# Patient Record
Sex: Female | Born: 1965 | Race: White | Hispanic: No | Marital: Married | Smoking: Never smoker
Health system: Southern US, Community
[De-identification: ages and names within clinical notes are randomized; demographics above are authoritative.]

## PROBLEM LIST (undated history)

## (undated) DIAGNOSIS — C801 Malignant (primary) neoplasm, unspecified: Secondary | ICD-10-CM

## (undated) DIAGNOSIS — I4891 Unspecified atrial fibrillation: Secondary | ICD-10-CM

## (undated) DIAGNOSIS — I471 Supraventricular tachycardia: Secondary | ICD-10-CM

## (undated) DIAGNOSIS — K219 Gastro-esophageal reflux disease without esophagitis: Secondary | ICD-10-CM

## (undated) DIAGNOSIS — K449 Diaphragmatic hernia without obstruction or gangrene: Secondary | ICD-10-CM

## (undated) DIAGNOSIS — I491 Atrial premature depolarization: Secondary | ICD-10-CM

## (undated) DIAGNOSIS — R0989 Other specified symptoms and signs involving the circulatory and respiratory systems: Secondary | ICD-10-CM

## (undated) DIAGNOSIS — R001 Bradycardia, unspecified: Secondary | ICD-10-CM

## (undated) DIAGNOSIS — R5383 Other fatigue: Secondary | ICD-10-CM

## (undated) DIAGNOSIS — I4719 Other supraventricular tachycardia: Secondary | ICD-10-CM

## (undated) DIAGNOSIS — R011 Cardiac murmur, unspecified: Secondary | ICD-10-CM

## (undated) DIAGNOSIS — I8390 Asymptomatic varicose veins of unspecified lower extremity: Secondary | ICD-10-CM

## (undated) DIAGNOSIS — I493 Ventricular premature depolarization: Secondary | ICD-10-CM

## (undated) DIAGNOSIS — I351 Nonrheumatic aortic (valve) insufficiency: Secondary | ICD-10-CM

## (undated) DIAGNOSIS — K317 Polyp of stomach and duodenum: Secondary | ICD-10-CM

## (undated) DIAGNOSIS — Z9889 Other specified postprocedural states: Secondary | ICD-10-CM

## (undated) HISTORY — DX: Unspecified atrial fibrillation: I48.91

## (undated) HISTORY — PX: CHOLECYSTECTOMY: SHX55

## (undated) HISTORY — DX: Nonrheumatic aortic (valve) insufficiency: I35.1

## (undated) HISTORY — DX: Other specified symptoms and signs involving the circulatory and respiratory systems: R09.89

## (undated) HISTORY — DX: Asymptomatic varicose veins of unspecified lower extremity: I83.90

## (undated) HISTORY — DX: Atrial premature depolarization: I49.1

## (undated) HISTORY — DX: Supraventricular tachycardia: I47.1

## (undated) HISTORY — PX: ABDOMINAL HYSTERECTOMY: SHX81

## (undated) HISTORY — DX: Diaphragmatic hernia without obstruction or gangrene: K44.9

## (undated) HISTORY — DX: Other supraventricular tachycardia: I47.19

## (undated) HISTORY — DX: Malignant (primary) neoplasm, unspecified: C80.1

## (undated) HISTORY — DX: Bradycardia, unspecified: R00.1

## (undated) HISTORY — DX: Ventricular premature depolarization: I49.3

## (undated) HISTORY — DX: Polyp of stomach and duodenum: K31.7

## (undated) HISTORY — DX: Cardiac murmur, unspecified: R01.1

## (undated) HISTORY — DX: Gastro-esophageal reflux disease without esophagitis: K21.9

## (undated) HISTORY — DX: Other specified postprocedural states: Z98.890

## (undated) HISTORY — DX: Other fatigue: R53.83

## (undated) HISTORY — PX: VESICOVAGINAL FISTULA CLOSURE W/ TAH: SUR271

## (undated) HISTORY — PX: ROTATOR CUFF REPAIR: SHX139

---

## 1997-08-07 ENCOUNTER — Other Ambulatory Visit: Admission: RE | Admit: 1997-08-07 | Discharge: 1997-08-07 | Payer: Self-pay | Admitting: Obstetrics and Gynecology

## 1997-08-17 ENCOUNTER — Other Ambulatory Visit: Admission: RE | Admit: 1997-08-17 | Discharge: 1997-08-17 | Payer: Self-pay | Admitting: Obstetrics and Gynecology

## 1998-01-22 ENCOUNTER — Encounter: Admission: RE | Admit: 1998-01-22 | Discharge: 1998-04-22 | Payer: Self-pay | Admitting: Obstetrics and Gynecology

## 1998-03-04 ENCOUNTER — Ambulatory Visit (HOSPITAL_COMMUNITY): Admission: RE | Admit: 1998-03-04 | Discharge: 1998-03-04 | Payer: Self-pay | Admitting: Obstetrics and Gynecology

## 1998-03-11 ENCOUNTER — Inpatient Hospital Stay (HOSPITAL_COMMUNITY): Admission: AD | Admit: 1998-03-11 | Discharge: 1998-03-14 | Payer: Self-pay | Admitting: Obstetrics and Gynecology

## 1998-05-06 ENCOUNTER — Other Ambulatory Visit: Admission: RE | Admit: 1998-05-06 | Discharge: 1998-05-06 | Payer: Self-pay | Admitting: Obstetrics and Gynecology

## 1999-07-23 ENCOUNTER — Other Ambulatory Visit: Admission: RE | Admit: 1999-07-23 | Discharge: 1999-07-23 | Payer: Self-pay | Admitting: Obstetrics and Gynecology

## 2000-08-10 ENCOUNTER — Other Ambulatory Visit: Admission: RE | Admit: 2000-08-10 | Discharge: 2000-08-10 | Payer: Self-pay | Admitting: Obstetrics and Gynecology

## 2000-08-17 ENCOUNTER — Encounter: Payer: Self-pay | Admitting: Obstetrics and Gynecology

## 2000-08-17 ENCOUNTER — Ambulatory Visit (HOSPITAL_COMMUNITY): Admission: RE | Admit: 2000-08-17 | Discharge: 2000-08-17 | Payer: Self-pay | Admitting: Obstetrics and Gynecology

## 2001-08-17 ENCOUNTER — Other Ambulatory Visit: Admission: RE | Admit: 2001-08-17 | Discharge: 2001-08-17 | Payer: Self-pay | Admitting: Obstetrics and Gynecology

## 2001-10-18 ENCOUNTER — Encounter (INDEPENDENT_AMBULATORY_CARE_PROVIDER_SITE_OTHER): Payer: Self-pay | Admitting: Specialist

## 2001-10-18 ENCOUNTER — Inpatient Hospital Stay (HOSPITAL_COMMUNITY): Admission: RE | Admit: 2001-10-18 | Discharge: 2001-10-21 | Payer: Self-pay | Admitting: Obstetrics and Gynecology

## 2002-08-16 ENCOUNTER — Encounter: Admission: RE | Admit: 2002-08-16 | Discharge: 2002-08-16 | Payer: Self-pay | Admitting: Obstetrics and Gynecology

## 2002-08-16 ENCOUNTER — Encounter: Payer: Self-pay | Admitting: Obstetrics and Gynecology

## 2002-10-04 ENCOUNTER — Other Ambulatory Visit: Admission: RE | Admit: 2002-10-04 | Discharge: 2002-10-04 | Payer: Self-pay | Admitting: Obstetrics and Gynecology

## 2003-12-27 ENCOUNTER — Encounter: Admission: RE | Admit: 2003-12-27 | Discharge: 2003-12-27 | Payer: Self-pay | Admitting: Obstetrics and Gynecology

## 2005-01-13 ENCOUNTER — Encounter: Admission: RE | Admit: 2005-01-13 | Discharge: 2005-01-13 | Payer: Self-pay | Admitting: Obstetrics and Gynecology

## 2005-03-21 ENCOUNTER — Emergency Department: Payer: Self-pay | Admitting: Internal Medicine

## 2005-11-13 ENCOUNTER — Ambulatory Visit: Payer: Self-pay | Admitting: Internal Medicine

## 2005-11-24 ENCOUNTER — Encounter: Admission: RE | Admit: 2005-11-24 | Discharge: 2005-11-24 | Payer: Self-pay | Admitting: Psychology

## 2005-11-27 ENCOUNTER — Encounter: Admission: RE | Admit: 2005-11-27 | Discharge: 2005-11-27 | Payer: Self-pay | Admitting: General Surgery

## 2006-01-14 ENCOUNTER — Ambulatory Visit (HOSPITAL_COMMUNITY): Admission: RE | Admit: 2006-01-14 | Discharge: 2006-01-14 | Payer: Self-pay | Admitting: General Surgery

## 2006-01-14 ENCOUNTER — Encounter (INDEPENDENT_AMBULATORY_CARE_PROVIDER_SITE_OTHER): Payer: Self-pay | Admitting: Specialist

## 2007-01-07 ENCOUNTER — Encounter: Admission: RE | Admit: 2007-01-07 | Discharge: 2007-01-07 | Payer: Self-pay | Admitting: Obstetrics and Gynecology

## 2007-04-28 DIAGNOSIS — Z9889 Other specified postprocedural states: Secondary | ICD-10-CM

## 2007-04-28 HISTORY — PX: CARDIAC CATHETERIZATION: SHX172

## 2007-04-28 HISTORY — DX: Other specified postprocedural states: Z98.890

## 2007-06-13 ENCOUNTER — Ambulatory Visit: Payer: Self-pay | Admitting: Family Medicine

## 2007-10-12 ENCOUNTER — Ambulatory Visit: Payer: Self-pay | Admitting: Gastroenterology

## 2007-11-28 ENCOUNTER — Other Ambulatory Visit: Payer: Self-pay

## 2007-11-28 ENCOUNTER — Ambulatory Visit: Payer: Self-pay | Admitting: Internal Medicine

## 2007-11-29 ENCOUNTER — Ambulatory Visit: Payer: Self-pay | Admitting: Internal Medicine

## 2007-12-07 ENCOUNTER — Encounter: Payer: Self-pay | Admitting: Internal Medicine

## 2007-12-07 ENCOUNTER — Ambulatory Visit: Payer: Self-pay | Admitting: Cardiology

## 2007-12-07 LAB — CONVERTED CEMR LAB: Pro B Natriuretic peptide (BNP): 125 pg/mL — ABNORMAL HIGH (ref 0.0–100.0)

## 2007-12-21 ENCOUNTER — Ambulatory Visit: Payer: Self-pay | Admitting: Cardiovascular Disease

## 2007-12-21 ENCOUNTER — Encounter: Payer: Self-pay | Admitting: Cardiology

## 2008-02-27 ENCOUNTER — Encounter: Admission: RE | Admit: 2008-02-27 | Discharge: 2008-02-27 | Payer: Self-pay | Admitting: Obstetrics and Gynecology

## 2008-10-02 DIAGNOSIS — K219 Gastro-esophageal reflux disease without esophagitis: Secondary | ICD-10-CM

## 2008-10-02 DIAGNOSIS — R002 Palpitations: Secondary | ICD-10-CM

## 2008-11-28 ENCOUNTER — Ambulatory Visit: Payer: Self-pay | Admitting: Family Medicine

## 2008-12-23 ENCOUNTER — Inpatient Hospital Stay (HOSPITAL_COMMUNITY): Admission: AD | Admit: 2008-12-23 | Discharge: 2008-12-23 | Payer: Self-pay | Admitting: Obstetrics and Gynecology

## 2009-03-02 ENCOUNTER — Ambulatory Visit: Payer: Self-pay | Admitting: Internal Medicine

## 2009-03-12 ENCOUNTER — Encounter: Admission: RE | Admit: 2009-03-12 | Discharge: 2009-03-12 | Payer: Self-pay | Admitting: Obstetrics and Gynecology

## 2009-04-10 ENCOUNTER — Ambulatory Visit: Payer: Self-pay | Admitting: Internal Medicine

## 2009-05-21 ENCOUNTER — Ambulatory Visit: Payer: Self-pay | Admitting: Internal Medicine

## 2009-05-28 DIAGNOSIS — K219 Gastro-esophageal reflux disease without esophagitis: Secondary | ICD-10-CM

## 2009-05-28 HISTORY — DX: Gastro-esophageal reflux disease without esophagitis: K21.9

## 2009-06-19 ENCOUNTER — Ambulatory Visit: Payer: Self-pay | Admitting: Gastroenterology

## 2009-10-31 LAB — HM PAP SMEAR: HM PAP: NORMAL

## 2010-03-13 ENCOUNTER — Encounter: Admission: RE | Admit: 2010-03-13 | Discharge: 2010-03-13 | Payer: Self-pay | Admitting: Obstetrics and Gynecology

## 2010-05-19 ENCOUNTER — Ambulatory Visit
Admission: RE | Admit: 2010-05-19 | Discharge: 2010-05-19 | Payer: Self-pay | Source: Home / Self Care | Attending: Cardiology | Admitting: Cardiology

## 2010-05-19 ENCOUNTER — Encounter: Payer: Self-pay | Admitting: Cardiology

## 2010-05-19 DIAGNOSIS — R5383 Other fatigue: Secondary | ICD-10-CM

## 2010-05-19 DIAGNOSIS — R5381 Other malaise: Secondary | ICD-10-CM | POA: Insufficient documentation

## 2010-05-19 DIAGNOSIS — R609 Edema, unspecified: Secondary | ICD-10-CM | POA: Insufficient documentation

## 2010-05-26 ENCOUNTER — Ambulatory Visit: Payer: Self-pay | Admitting: Internal Medicine

## 2010-05-27 ENCOUNTER — Ambulatory Visit: Admit: 2010-05-27 | Payer: Self-pay

## 2010-05-29 LAB — CONVERTED CEMR LAB
Basophils Relative: 0 % (ref 0–1)
Eosinophils Absolute: 0.1 10*3/uL (ref 0.0–0.7)
HCT: 40.1 % (ref 36.0–46.0)
Lymphocytes Relative: 31 % (ref 12–46)
Lymphs Abs: 2.3 10*3/uL (ref 0.7–4.0)
MCV: 89.7 fL (ref 78.0–100.0)
Monocytes Relative: 6 % (ref 3–12)
Neutro Abs: 4.4 10*3/uL (ref 1.7–7.7)
Platelets: 248 10*3/uL (ref 150–400)
Pro B Natriuretic peptide (BNP): 49.8 pg/mL (ref 0.0–100.0)
TSH: 3.682 microintl units/mL (ref 0.350–4.500)
WBC: 7.2 10*3/uL (ref 4.0–10.5)

## 2010-05-29 NOTE — Assessment & Plan Note (Signed)
Summary: ROV/AMD   Visit Type:  Follow-up Primary Provider:  Dr. Darrick Huntsman  CC:  c/o rapid heart beat, dizziness, and and swelling in legs. Pt states she is always tired.Alexis Bradley  History of Present Illness: 45 yo with history of fatigue and PVCs presents for evaluation of palpitations, fatigue, and lower extremity edema.  I saw the patient back in 8/09 for palpitations/PVCs.  I had set her up for a stress echo, but apparently she ended up having the test done at [facility] instead where, per her report, she passed out during the treadmill stress test and underwent a heart catheterization by Dr. Kirke Corin, showing minimal luminal irregularities and normal EF.  She saw him a couple of times in 2009 and had her metoprolol increased, but no other interventions were done and it was thoght that her heart was stable.   Patient is seen back again today for similar symptoms to what she had in 2009.  She continues to be fatigued.  The fatigue has not improved when she has stopped metoprolol in the past. She continues to have frequent palpitations despite taking metoprolol 50 mg two times a day.  She is drinking only 1 cup of coffee a day (which is improved from the past for her).  She feels like the palpitations have been worse over the last 4 months or so.  She is also concerned about new lower extremity swelling that she has developed.  This worsens throughout the day and improves (but does not completely resolve) overnight.  Mild exertional dyspnea with steps, no problems with flat ground (chronic).  No chest pain.    ECG: NSR, biatrial enlargement  Labs (8/09): BNP 125  Current Medications (verified): 1)  Metoprolol Tartrate 25 Mg Tabs (Metoprolol Tartrate) .... 2 Tablets Daily 2)  Nexium 40 Mg Cpdr (Esomeprazole Magnesium) .Alexis Bradley.. 1 Tablet Daily 3)  Magnesium .Alexis Bradley.. 1 Tablet Daily  Allergies (verified): No Known Drug Allergies  Past History:  Past Surgical History: Last updated:  10/02/2008 Cholecystectomy hysterectomy c-section cyst removed  Family History: Last updated: 05/19/2010 Family History of CVA (grandfather).  No premature CAD.   Social History: Last updated: 05/19/2010 The patient is a nonsmoker.  She rarely drinks alcohol. She is married.  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  No illicit drugs.   Risk Factors: Exercise: no (10/02/2008)  Risk Factors: Smoking Status: never (10/02/2008)  Past Medical History: 1. PALPITATIONS (ICD-785.1): Holter 2009 with PVCs and bigeminy.  2. GERD (ICD-530.81) 3. Left heart cath (2009): Apparently done after patient passed out during a treadmill stress test.  Luminal irregularities only in coronaries.  EF 60%.   4. Fatigue    Family History: Reviewed history from 10/02/2008 and no changes required. Family History of CVA (grandfather).  No premature CAD.   Social History: The patient is a nonsmoker.  She rarely drinks alcohol. She is married.  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  No illicit drugs.   Review of Systems       All systems reviewed and negative except as per HPI.   Vital Signs:  Patient profile:   45 year old female Height:      64 inches Weight:      192.25 pounds BMI:     33.12 Pulse rate:   59 / minute BP sitting:   126 / 72  (left arm) Cuff size:   large  Vitals Entered By:  Lysbeth Galas CMA (May 19, 2010 3:02 PM)  Physical Exam  General:  Well developed, well nourished, in no acute distress. Neck:  Neck supple, JVP about 8 cm. No masses, thyromegaly or abnormal cervical nodes. Lungs:  Clear bilaterally to auscultation and percussion. Heart:  Non-displaced PMI, chest non-tender; regular rate and rhythm, S1, S2 without murmurs, rubs or gallops. Carotid upstroke normal, no bruit. Pedals normal pulses. Trace ankle edema with bilateral lower leg varicosities.  Abdomen:   Bowel sounds positive; abdomen soft and non-tender without masses, organomegaly, or hernias noted. No hepatosplenomegaly. Extremities:  No clubbing or cyanosis. Neurologic:  Alert and oriented x 3. Psych:  Normal affect.   Impression & Recommendations:  Problem # 1:  PALPITATIONS (ICD-785.1) Palpitations seem worse over the last 4 months.  She has documented PVCs/bigeminy noted on prior holter.  She is taking her metoprolol twice a day and has not changed her caffeine intake.  I will get a 48 hour holter monitor to assess burden of PVCs and to look for any other arrhythmias.  If no significant abnormality on echo and very high burden of PVCs, ? cardiac MRI to look for evidence of ARVC or other structural abnormalities.   Problem # 2:  FATIGUE (ICD-780.79) Chronic x several years now.  Cardiac workup in 2009 was significant for an unremarkable catheterization, however BNP done at that time was very mildly elevated.  I will check CBC, TSH, and BNP today.   Problem # 3:  EDEMA (ICD-782.3) Possible mild elevation in neck veins and bilateral lower leg varicosities but minimal edema.  Cannot rule out some mild CHF (versus venous insufficiency).  As above, will get BNP.  Will get echo to assess LV and RV size and function and to look for pulmonary HTN.    Other Orders: EKG w/ Interpretation (93000) T-TSH [id number]) T-BNP  (B Natriuretic Peptide) (10272-53664) T-CBC w/Diff (40347-42595) Echocardiogram (Echo) Holter (Holter)  Patient Instructions: 1)  Your physician recommends that you schedule a follow-up appointment in: 2-3 weeks 2)  Your physician recommends that you continue on your current medications as directed. Please refer to the Current Medication list given to you today. 3)  Your physician has requested that you have an echocardiogram.  Echocardiography is a painless test that uses sound waves to create images of your heart. It provides your doctor with information about the size and  shape of your heart and how well your heart's chambers and valves are working.  This procedure takes approximately one hour. There are no restrictions for this procedure. 4)  Your physician has recommended that you wear a holter monitor.  Holter monitors are medical devices that record the heart's electrical activity. Doctors most often use these monitors to diagnose arrhythmias. Arrhythmias are problems with the speed or rhythm of the heartbeat. The monitor is a small, portable device. You can wear one while you do your normal daily activities. This is usually used to diagnose what is causing palpitations/syncope (passing out).

## 2010-06-04 ENCOUNTER — Ambulatory Visit: Payer: Self-pay | Admitting: Cardiology

## 2010-06-04 NOTE — Procedures (Signed)
Summary: Holter and Event  Holter and Event   Imported By: Harlon Flor 05/30/2010 09:51:43  _____________________________________________________________________  External Attachment:    Type:   Image     Comment:   External Document

## 2010-06-17 ENCOUNTER — Other Ambulatory Visit (INDEPENDENT_AMBULATORY_CARE_PROVIDER_SITE_OTHER): Payer: BC Managed Care – PPO

## 2010-06-17 ENCOUNTER — Other Ambulatory Visit: Payer: Self-pay | Admitting: Cardiology

## 2010-06-17 DIAGNOSIS — R0602 Shortness of breath: Secondary | ICD-10-CM

## 2010-06-20 ENCOUNTER — Ambulatory Visit (INDEPENDENT_AMBULATORY_CARE_PROVIDER_SITE_OTHER): Payer: BC Managed Care – PPO | Admitting: Cardiology

## 2010-06-20 ENCOUNTER — Encounter: Payer: Self-pay | Admitting: Cardiology

## 2010-06-20 DIAGNOSIS — R002 Palpitations: Secondary | ICD-10-CM

## 2010-06-20 DIAGNOSIS — I359 Nonrheumatic aortic valve disorder, unspecified: Secondary | ICD-10-CM

## 2010-06-20 DIAGNOSIS — I351 Nonrheumatic aortic (valve) insufficiency: Secondary | ICD-10-CM | POA: Insufficient documentation

## 2010-06-26 DIAGNOSIS — K317 Polyp of stomach and duodenum: Secondary | ICD-10-CM

## 2010-06-26 HISTORY — DX: Polyp of stomach and duodenum: K31.7

## 2010-07-03 NOTE — Procedures (Signed)
Summary: summary report  summary report   Imported By: Mirna Mires 06/23/2010 14:47:14  _____________________________________________________________________  External Attachment:    Type:   Image     Comment:   External Document

## 2010-07-03 NOTE — Assessment & Plan Note (Signed)
Summary: 2-3 week fu appt from  appt/mt/ rs per pt request-mb   Primary Provider:  Dr. Darrick Huntsman   History of Present Illness: 45 yo with history of fatigue and PVCs returns for evaluation of palpitations, fatigue, and lower extremity edema.  I saw the patient back in 8/09 for palpitations/PVCs.  I had set her up for a stress echo, but apparently she ended up having the test done at [facility] instead where, per her report, she passed out during the treadmill stress test and underwent a heart catheterization by Dr. Kirke Corin, showing minimal luminal irregularities and normal EF.  She saw him a couple of times in 2009 and had her metoprolol increased, but no other interventions were done and it was thoght that her heart was stable.   Patient was seen again about a month ago for similar symptoms to what she had in 2009.  She continued to be fatigued.  The fatigue did not improved when she stopped metoprolol in the past. She continued to have frequent palpitations despite taking metoprolol 25 mg two times a day.  Mild exertional dyspnea with steps, no problems with flat ground (chronic).  No chest pain.  Occasional lower extremity edema.    I had her do a holter monitor, which showed occasional PVCs and short runs of atrial tachycardia (up to 9 beats).  Echo showed normal EF with mild to moderate aortic insufficiency and mild mitral regurgitation.    Labs (8/09): BNP 125 Labs (1/12): BNP 50, TSH normal, HCT 40.1  Current Medications (verified): 1)  Metoprolol Tartrate 25 Mg Tabs (Metoprolol Tartrate) .... One Tablet Two Times A Day 2)  Nexium 40 Mg Cpdr (Esomeprazole Magnesium) .Marland Kitchen.. 1 Tablet Daily 3)  Magnesium .Marland Kitchen.. 1 Tablet Daily  Allergies (verified): No Known Drug Allergies  Past History:  Past Medical History: 1. PALPITATIONS (ICD-785.1): Holter 2009 with PVCs and bigeminy.  Holter 1/12 with PVCs and short runs of atrial tachycardia (up to 9 beats).   2. GERD (ICD-530.81) 3.  Left heart cath (2009): Apparently done after patient passed out during a treadmill stress test.  Luminal irregularities only in coronaries.  EF 60%.   4. Fatigue 5. 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     Family History: Reviewed history from 05/19/2010 and no changes required. Family History of CVA (grandfather).  No premature CAD.   Social History: Reviewed history from 05/19/2010 and no changes required. The patient is a nonsmoker.  She rarely drinks alcohol. She is married.  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  No illicit drugs.   Review of Systems       All systems reviewed and negative except as per HPI.   Vital Signs:  Patient profile:   45 year old female Height:      64 inches Weight:      193 pounds BMI:     33.25 Pulse rate:   56 / minute Pulse rhythm:   regular BP sitting:   110 / 52  (left arm) Cuff size:   regular  Vitals Entered By: Judithe Modest CMA (June 20, 2010 11:33 AM)  Physical Exam  General:  Well developed, well nourished, in no acute distress. Neck:  Neck supple, no JVD. No masses, thyromegaly or abnormal cervical nodes. Lungs:  Clear bilaterally to auscultation and percussion. Heart:  Non-displaced PMI,  chest non-tender; regular rate and rhythm, S1, S2 without murmurs, rubs or gallops. Carotid upstroke normal, no bruit. Pedals normal pulses. No ankle edema.  Bilateral lower leg varicosities.  Abdomen:  Bowel sounds positive; abdomen soft and non-tender without masses, organomegaly, or hernias noted. No hepatosplenomegaly. Extremities:  No clubbing or cyanosis. Neurologic:  Alert and oriented x 3. Psych:  Normal affect.   Impression & Recommendations:  Problem # 1:  PALPITATIONS (ICD-785.1) Holter showed short runs of atrial tachycardia, which  may be the source of her symptoms.  No run was longer than 9 beats.  Would cut out caffeine as much as possible.  She is on metoprolol tartrate at this time.  I suggested that she switch to Toprol XL 25 mg two times a day for more complete daily beta blockade coverage.    Problem # 2:  AORTIC VALVE DISORDERS (ICD-424.1) Mild to moderate aortic insufficiency and mild mitral regurgitation.  The aortic valve was trileaflet.  These findings did not appear severe enough to cause symptoms, and she had a BNP checked that was not significantly elevated.  Valvular problems will need to be followed over time.  Will repeat an echo in about a year (mainly concerned about the AI).  This will also allow me to follow her for progressive dilatation of the ascending aorta (3.7 cm on this echo).                                                                                                                                                                                                        Patient Instructions: 1)  Your physician recommends that you schedule a follow-up appointment in: 1 year in [place] 2)  Your physician has recommended you make the following change in your medication: start metoprolol xl--25mg  twice daily 3)  Your physician has requested that you have an echocardiogram.  please sched appoint for 1 [place] is a painless test that uses sound waves to create images of your heart. It provides your doctor with information about the size and shape of your heart and how well your heart's chambers and valves are working.  This procedure takes approximately one hour. There are no restrictions for this procedure. Prescriptions: TOPROL XL 25 MG XR24H-TAB (METOPROLOL SUCCINATE) take 1 tablet twice daily  #180 x 3   Entered and Authorized by:   Marca Ancona, MD   Signed by:   Marca Ancona, MD on 06/22/2010   Method used:   Print then Give to Patient   RxID:  [id number]  

## 2010-07-03 NOTE — Cardiovascular Report (Signed)
SummaryScientist, physiological [facility]   [facility]   Imported By: Roderic Ovens 06/20/2010 16:15:49  _____________________________________________________________________  External Attachment:    Type:   Image     Comment:   External Document

## 2010-07-24 NOTE — Procedures (Signed)
Summary: Holter and Event  Holter and Event   Imported By: Harlon Flor 07/14/2010 14:47:44  _____________________________________________________________________  External Attachment:    Type:   Image     Comment:   External Document

## 2010-08-02 LAB — URINALYSIS, ROUTINE W REFLEX MICROSCOPIC
Hgb urine dipstick: NEGATIVE
Nitrite: NEGATIVE

## 2010-09-09 NOTE — Assessment & Plan Note (Signed)
[facility] OFFICE NOTE   Alexis, Bradley                       MRN:          [id number]  DATE:12/07/2007                            DOB:          19-Apr-1966    PRIMARY CARE PHYSICIAN:  Duncan Dull, MD, at [facility].   HISTORY OF PRESENT ILLNESS:  This is a 45 year old with a history of  palpitations over the last month who presents to Cardiology Clinic for  evaluation of the same.  The patient states that over the last month,  she has had frequent palpitations.  These occur daily and multiple times  daily.  They occur at rest and also with exertion.  She thinks that it  may be they are somewhat more marked with exertion.  They tend to be  strong beats, followed by pauses and she also has what she feels like  her occasional runs of more rapid heart rate.  She has had no episodes  of lightheadedness or syncope.  However, she does note that lately she  has been more short of breath with exertion.  At baseline, she does get  short of breath with exertion .  However, over the last month or so,  climbing a flight of stairs will get her winded.   She would also be short of breath if she tried to walk 50 to 100 yards  on flat ground if she was hurrying.  This is also unusual for her.  She  has noted no chest pain or discomfort.  She did drink a lot of caffeine  including 2 cups of coffee a day, and four diet cokes.  She has tried to  cut back on that.  She is down to 1 cup of coffee a day and one diet  coke  However, that has not really changed her symptoms at all.  She has  been under significant amount of stress lately.   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  The only new medication  she has begun was dexlansoprazole for reflux that she begun  about a  month ago also.  She denies use of any over-the-counter cold  medications.  No elicit drugs.  She did have a Holter monitor done  earlier this month, but it showed frequent PVCs and occasional runs of  bigeminy, however she had no nonsustained or sustained ventricular  tachycardia.   The patient was started on metoprolol 25 mg twice a day about 5 days ago  by her primary care physician.  She states this may have decreased the  symptoms a little, but she cannot tell a whole lot of difference.  The  EKG today is normal sinus rhythm with borderline right atrial  enlargement.  There are no PVCs on today's EKG.  A Holter monitor done  on November 29, 2007, shows frequent isolated PVCs and bigeminy.  No longer  runs  of ventricular beats.   PAST MEDICAL HISTORY:  1. Status post cholecystectomy.  2. Total abdominal hysterectomy.  3. Gestational diabetes.  4. Gastroesophageal reflux disease.  The patient does have a known      hiatal hernia on EGD.  5. History of peptic ulcer disease.   SOCIAL HISTORY:  The patient is a nonsmoker.  She rarely drinks alcohol.  She was married.  She is a Surveyor, quantity at [employer], and works in the administration.  No illicit drugs.   FAMILY HISTORY:  This patient's grandfather has a stroke.  She denies  any premature coronary disease in her family.   REVIEW OF SYSTEMS:  Also, the patient notes that this is negative except  as noted in the history of present illness, with the exception of the  fact that the patient has noted some mild swelling in her ankles for  about a year.   PHYSICAL EXAMINATION:  VITAL SIGNS:  Blood pressure is 118/72, heart  rate is 59 with occasional premature beats. Weight is 190 pounds.  GENERAL:  No apparent distress.  This is a mildly obese female.  NEUROLOGICAL:  Alert and oriented x3.  Normal affect.  NECK:  No JVD.  There is no thyroid enlargement or nodules.  LUNGS:  Clear to auscultation  bilaterally with normal respiratory  effort.  CARDIOVASCULAR:  Regular S1 and S2 with an occasional premature beat.  No S3 and no S4.  There is a soft 1/6 ejection type systolic murmur at  the upper sternal border.  There is no pitting peripheral edema.  There  is no carotid bruits.  Dorsalis pedis pulses 1+ bilaterally.  ABDOMEN:  Soft, obese, nontender, and no hepatosplenomegaly.  EXTREMITIES:  No clubbing or cyanosis.  SKIN:  There are some changes consistent with venous insufficiency in  the bilateral lower legs.  MUSCULOSKELETAL:  Normal exam.  HEENT:  Normal exam.   LABORATORY DATA:  The patient has had lipids and chemistries done  recently with her primary care physician.  She was told that the  chemistries were all normal about a week ago.  She also had a TSH  checked by her primary care physician, which she said that she was told  it was normal.  These are not available for my review today.   ASSESSMENT AND PLAN:  This is a 45 year old with a history of  palpitations and premature ventricular contractions on 24-Holter monitor  who additionally has had some new dyspnea on exertion.  1. Palpitations/premature ventricular contractions.  These have been      present for about a month.  The patient thinks they may be somewhat      more prominent with exertion.  There are actually none on her EKG      today.  However, on  my examination of her, she does evidence for      occasional premature ventricular contractions.  She has been on      metoprolol 25 mg b.i.d. for 5 days with some, but minimal, relief      of symptoms.  I think we need to ensure that these premature      ventricular contractions are benign.  To do this, we will obtain an      echocardiogram to assess for any structural heart disease.  We will      make this a stress echo to allow [place] to both assess the rhythm      response to exercise, and  also to assess for any wall motion      abnormalities with exercise on  echocardiogram.  I did encourage the      patient to cut out caffeine completely.  She also was started on      dexlansoprazole at about the same period of time that she has been      having these symptoms.  Palpitations and premature ventricular      contractions are not listed as a side effect of dexlansoprazole,      but I suggested that she try stopping it for a few days to see if      there is any relief of her symptoms for stopping it.  Finally, we      will obtain an event monitor over the next 14 days before she sees      me back in clinic again to see if there is any evidence for longer      ventricular runs than the single premature ventricular contractions      and the bigeminy that we saw on the 24-hour Holter.  2. Coronary artery disease risk.  The patient is at low risk from a      coronary artery disease standpoint.  She is 45 year old woman who      is a nonsmoker with no history of diabetes, hypercholesterolemia,      early family history of coronary artery disease, or high blood      pressure.  She has, however, developed some exertional dyspnea over      the last few weeks, at the same time with which she has been having      these palpitations.  Given these symptoms, we will go ahead and do      a stress echo instead of a rest echo alone to assess for any      evidence of coronary disease, though my suspicion for coronary      artery disease is more.  We will also check a BNP.  3. We will attain the recently done labs from the patient's primary      care office. It does include thyroid function tests, chemistries,      and lipids.  4. We will see the patient back in 2-3 weeks after this testing is      complete.     Marca Ancona, MD  Electronically Signed    DM/MedQ  DD: 12/07/2007  DT: 12/08/2007  Job #: [id number]   cc:   Duncan Dull, M.D.

## 2010-09-09 NOTE — H&P (Signed)
Alexis Bradley, Alexis Bradley                ACCOUNT NO.:  [id number]   MEDICAL RECORD NO.:  [id number]          PATIENT TYPE:  [facility]   LOCATION:  [facility]                          FACILITY:  [facility]   PHYSICIAN:  Lenoard Aden, M.D.DATE OF BIRTH:  [birth year]   DATE OF ADMISSION:  12/23/2008  DATE OF DISCHARGE:  12/23/2008                              HISTORY & PHYSICAL   CHIEF COMPLAINT:  Abdominal pain.   HISTORY OF PRESENT ILLNESS:  She is a 45 year old white female, G1, P1,  status post TAH approximately 6 years ago in 2003 who presents with an  intermittent lower abdominal discomfort over the past week.   ALLERGIES:  She had no known drug allergies.  No known latex allergies.   MEDICATIONS:  Multivitamin, Kapidex,  magnesium, metoprolol.   FAMILY HISTORY:  Breast cancer in sister.   SURGICAL HISTORY:  Cholecystectomy, abdominal hysterectomy, and vaginal  delivery as noted.  No other medical or surgical hospitalizations.   PHYSICAL EXAMINATION:  GENERAL:  The patient is a well-developed, well-  nourished white female in no acute distress.  HEENT:  Normal.  LUNGS:  Clear.  HEART:  Regular rhythm.  ABDOMEN:  Soft, nondistended.  Nontender to palpation.  No rebound.  No  guarding.  PELVIC:  A well-healed vaginal cuff and no adnexal masses were  appreciated by a pelvic exam.  EXTREMITIES:  There are no cords.  NEUROLOGIC:  Nonfocal.  SKIN:  Intact.   LABORATORY DATA:  Urinalysis is pending.  Ultrasound is pending.   IMPRESSION:  1. Lower abdominal discomfort, questionable etiology, no acute      abnormalities noted.  2. Status post total abdominal hysterectomy in 2003.  3. History of cholecystectomy.   PLAN:  Check pelvic ultrasound.  Check urinalysis.  Toradol 30 mg pack  given.  Discharge home.  Pending results.  Followup in the office as  scheduled in my office.      Lenoard Aden, M.D.  Electronically Signed     RJT/MEDQ  D:  12/23/2008  T:  12/23/2008  Job:   [id number]

## 2010-09-12 NOTE — Op Note (Signed)
[facility]  Patient:    Alexis Bradley, Alexis Bradley Visit Number: [id number] MRN: [id number]          Service Type: GYN Location: 9300 [id number] Attending Physician:  Lenoard Aden Dictated by:   Lenoard Aden, M.D. Proc. Date: 10/18/01 Admit Date:  10/18/2001                             Operative Report  CHIEF COMPLAINT:              Menometrorrhagia and symptomatic uterine fibroids.  HISTORY OF PRESENT ILLNESS:   The patient is a 45 year old white female; G1, P1, with history of C-section x1.  She presents for definitive therapy. The patient is unresponsive to hormonal therapy with regards to irregular bleeding and pelvic pain.  PAST MEDICAL HISTORY:         Remarkable for C-section x1, gastroesophageal reflux with secondary ulcer, increased body mass index and class A2 diabetes in pregnancy with adult onset diabetes (now markedly improved).  SURGICAL HISTORY:             Remarkable for C-section, pilonidal cyst and upper endoscopy.  CURRENT MEDICATIONS:          Birth control pills.  FAMILY HISTORY:               Cancer, stroke, peptic ulcer disease and hypertension.  SOCIAL HISTORY:               The patient is a nonsmoker, nondrinker.  Denies domestic or physical violence.  WORKUP:                       To include a normal laboratory workup in the form of TSH, prolactin.  Ultrasound consistent with an anterior uterine fibroid.  The endometrial cavity was clear.  As noted, the patient is refractory to hormonal therapy.  PHYSICAL EXAMINATION:  GENERAL:                      She is a well developed, well nourished white female, in no apparent distress.  HEENT:                        Normal.  LUNGS:                        Clear.  HEART:                        Regular rhythm.  ABDOMEN:                      Soft, nontender.  GENITOURINARY:                Uterus is bulky and anteflexed.  No adnexal masses.  IMPRESSION:                    Menometrorrhagia refractory to therapy, with symptomatic uterine fibroids.  PLAN:                         Proceed with TAH and ovarian conservation.  The risks of anesthesia, infection, bleeding, injury to abdominal organs and need for repair were discussed, the risk versus immediate complications to include bowel or bladder injury noted.  The patient acknowledges and wishes  to proceed. Dictated by:   Lenoard Aden, M.D. Attending Physician:  Lenoard Aden DD:  10/18/01 TD:  10/18/01 Job: [id number]

## 2010-09-12 NOTE — Op Note (Signed)
[facility]  Patient:    Alexis Bradley, Alexis Bradley Visit Number: [id number] MRN: [id number]          Service Type: GYN Location: 9300 [id number] Attending Physician:  Lenoard Aden Dictated by:   Lenoard Aden, M.D. Proc. Date: 10/18/01 Admit Date:  10/18/2001                             Operative Report  PREOPERATIVE DIAGNOSES:       Menometrorrhagia, symptomatic uterine fibroids, enterocele.  POSTOPERATIVE DIAGNOSES:      Menometrorrhagia, symptomatic uterine fibroids, enterocele.  PROCEDURE:                    Total abdominal hysterectomy, McCall culdoplasty.  SURGEON:                      Lenoard Aden, M.D.  ASSISTANT:                    Marina Gravel, M.D.  ANESTHESIA:                   General.  ESTIMATED BLOOD LOSS:         200 cc.  COMPLICATIONS:                None.  DRAINS:                       Foley.  DISPOSITION:                  The patient went to recovery in good condition.  COUNTS:                       Correct.  BRIEF OPERATIVE NOTE:         After being apprised of the risks of anesthesia, infection, bleeding, injury to abdominal organs, need for repair, the patient was brought to the operating room where she was administered a general anesthetic without complications, prepped and draped in usual sterile fashion, Foley catheter placed.  Examination under anesthesia reveals a bulky anteflexed well supported uterus with a small posterior enterocele.  At this time Pfannenstiel skin incision made with the scalpel after dilute Marcaine solution 10 cc placed.  Fascia nicked in the midline and opened transversely using Mayo scissors.  Rectus muscles dissected sharply in midline.  Peritoneum entered sharply.  Balfour retractor placed.  Abdominal exploration reveals pulsatile aorta.  No adenopathy.  Normal spleen, liver, gallbladder area. Appendix not visualized.  At this time bowel contents are packed off from the pelvis  using moist lap packs.  Enterocele is identified.  Normal right ovary. Left ovary with a small follicular cyst which is aspirated.  At this time the round ligaments are bilaterally clamped and suture ligated.  LigaSure is used to take down the tubo-ovarian round ligament complex and divided uterine vessels clamped bilaterally using LigaSure, and divided.  Round ligament pedicles previously been held.  Uterosacral ligaments taken separately and clamped and suture ligated using Heaney clamp and 0 Vicryl suture.  Vaginal entry made from lateral sides bilaterally and specimen is removed.  Bladder flap is sharply dissected off the lower uterine segment prior to establishing the previous operative field without difficulty using Metzenbaum scissors.  At this time bladder is intact.  Vagina is closed side to side using 0 Vicryl pop-off  sutures.  Uterosacrals are tied to the vaginal angle sutures which had been placed with 0 Vicryl suture.  McCall culdoplasty suture with two 0 Vicryl sutures is placed after identifying the ureters previously in the case and reidentifying their location prior to the [facility] culdoplasty suture.  Good hemostasis is noted.  Irrigation is accomplished.  Uterosacral ligaments are plicated in the midline.  Irrigation is reaccomplished.  Good hemostasis is noted.  Packs and the retractor are removed.  Fascia is closed using a 1 Vicryl in a continuous running fashion.  Skin closed using staples.  The patient tolerates procedure well.  Is transferred to recovery in good condition. Dictated by:   Lenoard Aden, M.D. Attending Physician:  Lenoard Aden DD:  10/18/01 TD:  10/19/01 Job: [id number]

## 2010-09-12 NOTE — Op Note (Signed)
Alexis Bradley, Alexis Bradley                ACCOUNT NO.:  [id number]   MEDICAL RECORD NO.:  [id number]          PATIENT TYPE:  AMB   LOCATION:  SDS                          FACILITY:  [facility]   PHYSICIAN:  Cherylynn Ridges, M.D.    DATE OF BIRTH:  03-Dec-1965   DATE OF PROCEDURE:  01/14/2006  DATE OF DISCHARGE:                                 OPERATIVE REPORT   PREOP DIAGNOSIS:  Symptomatic biliary dyskinesia.   POSTOP DIAGNOSIS:  Symptomatic biliary dyskinesia.   PROCEDURE:  Laparoscopic cholecystectomy.   SURGEON:  Cherylynn Ridges, M.D.   ASSISTANT:  Leonie Man, M.D.   ANESTHESIA:  General endotracheal.   ESTIMATED BLOOD LOSS:  Less than 20 mL.   COMPLICATIONS:  None.   CONDITION:  Stable.   FINDINGS:  The patient had what appeared to be normal anatomy, no evidence  of acute gallbladder disease.  Cholangiogram showed good flow into the  duodenum, no biliary ductal defects, no dilatation, and good proximal flow.   INDICATIONS FOR OPERATION:  The patient is a 45 year old with abdominal pain  right upper quadrant, postprandially, who now comes in for a laparoscopic  cholecystectomy.   OPERATION:  The patient was taken to the operating room and placed on table  in the supine position.  After an adequate endotracheal anesthetic was  administered, she was prepped and draped in usual sterile manner exposing  the midline in the right upper quadrant.   A supraumbilical curvilinear incision was made using a #11 blade and taken  down to the midline fascia.  We able to grasp the fascia using a Kocher  clamp; and then make an incision between that with a #15 blade.  We then  were able to grasp the edges of the fascia, and then bluntly dissect down  into the peritoneal cavity using a Kelly clamp.  Once this was done, we were  able to pass a pursestring suture of #0 Vicryl around the fascial opening  into the peritoneal cavity; and this secured in the [facility] cannula which was  subsequently  passed.   Once the [facility] cannula was in place we insufflated carbon dioxide gas  through the [facility] cannula into the peritoneal cavity up to a maximal intra-  abdominal pressure of 15 mmHg.   Subsequently two right costal margin 5-mm cannulas and a subxiphoid 11/12 mm  cannula were passed under direct vision into the peritoneal cavity.  The  patient was placed in reverse Trendelenburg and the left-side was tilted  down.   A grasper was passed through the lateral-most 5-mm cannula onto the dome of  the gallbladder.  We retracted towards the right upper quadrant and the  anterior abdominal wall as a second grasper was passed onto the  infundibulum.  We dissected out the peritoneum overlying the triangle of  Fallot and hepatic duodenal triangle, isolating out with adequate windows  the cystic duct and the cystic artery.  Both of these were subsequently  transected; however, we passed a proximal probe along the gallbladder side  of and cystic duct; then performed a cholangiocatheter through  cholecystodochotomy  made with laparoscopic scissors.  A Cook catheter was  passed into the anterior abdominal wall and passed into the  cholecystodochotomy through which a cholangiogram was done and showed good  flow into the duodenum, no ductal filling defects, no dilatation of the  duct, and good proximal flow.   Once the cholangiogram was completed, the clip was removed from the duct,  securing the catheter in place.  We subsequently passed two endoclips along  the distal part of the cystic duct; then we transected the duct.  Two  proximal and two distal clips were placed on the cystic artery; which was  transected.  Then we dissected out the gallbladder from its bed with minimal  difficulty.   The gallbladder once it was out of its bed, was removed from the  supraumbilical site using a large grasper without difficulty.  We then used  a pursestring suture which was then placed to close off the  supraumbilical  fascial site.  Once this was done, we injected 1/4% Marcaine into the  subcutaneous area with a 25-gauge needle.  We then used a 4-0 Vicryl to  reapproximate the skin at all sites.  We aspirated fluid and gas from above  the liver prior to closure, and removal of cannulas.   All needle count, sponge counts, and instrument counts were correct.  Once  this was done.  Sterile dressings were applied.      Cherylynn Ridges, M.D.  Electronically Signed     JOW/MEDQ  D:  01/14/2006  T:  01/15/2006  Job:  [id number]   cc:   Lenoard Aden, M.D.

## 2010-09-12 NOTE — Discharge Summary (Signed)
   NAME:  Alexis Bradley, Alexis Bradley                            ACCOUNT NO.:  [id number]   MEDICAL RECORD NO.:  [id number]                   PATIENT TYPE:   LOCATION:                                       FACILITY:   PHYSICIAN:  Lenoard Aden, M.D.             DATE OF BIRTH:   DATE OF ADMISSION:  10/18/2001  DATE OF DISCHARGE:  10/21/2001                                 DISCHARGE SUMMARY   HOSPITAL COURSE:  The patient underwent an uncomplicated TAH and McCall  culdoplasty for menometrorrhagia, uterine fibroids, and enterocele on October 18, 2001.  Postoperative course uncomplicated.  Hemoglobin 12.1.  Tolerated  regular diet well.   DISPOSITION:  Discharged to home postoperative day #3.  Darvocet for pain.  Follow-up in the office in four to six weeks.                                               Lenoard Aden, M.D.    RJT/MEDQ  D:  12/18/2001  T:  12/18/2001  Job:  [id number]

## 2010-10-16 ENCOUNTER — Encounter: Payer: Self-pay | Admitting: Cardiology

## 2010-12-25 ENCOUNTER — Encounter: Payer: Self-pay | Admitting: Vascular Surgery

## 2011-01-01 ENCOUNTER — Other Ambulatory Visit: Payer: Self-pay | Admitting: Internal Medicine

## 2011-01-01 NOTE — Telephone Encounter (Signed)
Sent the request to Dr. Darrick Huntsman, waiting for a response.

## 2011-01-01 NOTE — Telephone Encounter (Signed)
Checking nexium refill   [facility] and [facility] told her they hadn't heard from [place] yet

## 2011-01-02 MED ORDER — ESOMEPRAZOLE MAGNESIUM 40 MG PO CPDR: 40.0000 mg | DELAYED_RELEASE_CAPSULE | Freq: Every day | ORAL | Status: DC

## 2011-01-26 ENCOUNTER — Encounter: Payer: BC Managed Care – PPO | Admitting: Vascular Surgery

## 2011-01-27 ENCOUNTER — Ambulatory Visit (INDEPENDENT_AMBULATORY_CARE_PROVIDER_SITE_OTHER): Payer: BC Managed Care – PPO | Admitting: Internal Medicine

## 2011-01-27 ENCOUNTER — Encounter: Payer: Self-pay | Admitting: Internal Medicine

## 2011-01-27 DIAGNOSIS — D131 Benign neoplasm of stomach: Secondary | ICD-10-CM

## 2011-01-27 DIAGNOSIS — E669 Obesity, unspecified: Secondary | ICD-10-CM | POA: Insufficient documentation

## 2011-01-27 DIAGNOSIS — K317 Polyp of stomach and duodenum: Secondary | ICD-10-CM

## 2011-01-27 DIAGNOSIS — R7309 Other abnormal glucose: Secondary | ICD-10-CM

## 2011-01-27 DIAGNOSIS — R739 Hyperglycemia, unspecified: Secondary | ICD-10-CM

## 2011-01-27 NOTE — Assessment & Plan Note (Signed)
15 minutes of counselling on weight loss and exercise and low glycemic index diet was given.  recommeded return n 3 months fore repeat labs. ,

## 2011-01-27 NOTE — Patient Instructions (Addendum)
You can lower your risk for developing diabetes by restricting your starches to one serving of bread, rice, potatoes or pasta) and exercisign 5 days per week for 25 minutes daily   Try the atkins bars,  And the shakes by EAS  All at [employer]  Try the [employer] fudgsicles or ice cream bars. [email or web address]  For all kinds of compression stockings/knee highs

## 2011-01-27 NOTE — Progress Notes (Signed)
  Subjective:    Patient ID: Alexis Bradley, female    DOB: Aug 20, 1965, 45 y.o.   MRN: [id number]  HPI  45 yo w f with history of gestational diabetes, weight gain of 40 lbs since delivery of last child, referred back to me for abnormal lab test:  She had a  hgba1c of 6.0 drawn by her GYN Dr. Billy Coast. Her husband has DM which is not currently controlled.  She is not exercising and not adjusting her diet despite the weight gain.    Past Medical History  Diagnosis Date  . Palpitations     Holder 2009 with PVCs and bigeminy. Holter 1/12 with PVCs and short runs of atrial tachycardia (up to 9 beats)  . GERD (gastroesophageal reflux disease)   . S/P left heart catheterization by percutaneous approach 2009    Apparently done after patient passed out during a treadmill stress test. Luminal irregularities only in coronaries. EF 60%  . Fatigue   . 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 esomeprazole (NEXIUM) 40 MG capsule Take 1 capsule (40 mg total) by mouth daily.  30 capsule  3  . MAGNESIUM PO Take 1 tablet by mouth daily.        . metoprolol succinate (TOPROL-XL) 25 MG 24 hr tablet Take 25 mg by mouth 2 (two) times daily.          Review of Systems  Constitutional: Positive for unexpected weight change. Negative for fever and chills.  HENT: Negative for hearing loss, ear pain, nosebleeds, congestion, sore throat, facial swelling, rhinorrhea, sneezing, mouth sores, trouble swallowing, neck pain, neck stiffness, voice change, postnasal drip, sinus pressure, tinnitus and ear discharge.   Eyes: Negative for pain, discharge, redness and visual disturbance.  Respiratory: Negative for cough, chest tightness, shortness of breath, wheezing and stridor.     Cardiovascular: Negative for chest pain, palpitations and leg swelling.  Musculoskeletal: Negative for myalgias and arthralgias.  Skin: Negative for color change and rash.  Neurological: Negative for dizziness, weakness, light-headedness and headaches.  Hematological: Negative for adenopathy.       Objective:   Physical Exam  Constitutional: She is oriented to person, place, and time. She appears well-developed and well-nourished.  HENT:  Mouth/Throat: Oropharynx is clear and moist.  Eyes: EOM are normal. Pupils are equal, round, and reactive to light. No scleral icterus.  Neck: Normal range of motion. Neck supple. No JVD present. No thyromegaly present.  Cardiovascular: Normal rate, regular rhythm, normal heart sounds and intact distal pulses.   Pulmonary/Chest: Effort normal and breath sounds normal.  Abdominal: Soft. Bowel sounds are normal. She exhibits no mass. There is no tenderness.  Musculoskeletal: Normal range of motion. She exhibits no edema.  Lymphadenopathy:    She has no cervical adenopathy.  Neurological: She is alert and oriented to person, place, and time.  Skin: Skin is warm and dry.  Psychiatric: She has a normal mood and affect.          Assessment & Plan:

## 2011-01-30 ENCOUNTER — Encounter: Payer: Self-pay | Admitting: Vascular Surgery

## 2011-02-02 ENCOUNTER — Encounter: Payer: Self-pay | Admitting: Vascular Surgery

## 2011-02-02 ENCOUNTER — Ambulatory Visit (INDEPENDENT_AMBULATORY_CARE_PROVIDER_SITE_OTHER): Payer: BC Managed Care – PPO | Admitting: Vascular Surgery

## 2011-02-02 VITALS — BP 132/62 | HR 56 | Resp 20 | Ht 64.0 in | Wt 190.6 lb

## 2011-02-02 DIAGNOSIS — I83893 Varicose veins of bilateral lower extremities with other complications: Secondary | ICD-10-CM

## 2011-02-02 NOTE — Progress Notes (Signed)
Subjective:     Patient ID: Alexis Bradley, female   DOB: [birth year], 45 y.o.   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 She has a long history of increasing bulging varicosities in both medial thigh and medial calf areas. She has no history of bleeding, ulceration, DVT, or thrombophlebitis. She does not wear long elastic compression stockings. She rarely elevates her legs. He does have throbbing aching and burning discomfort in both thighs and calfs as the day progresses Past Medical History  Diagnosis Date  . Palpitations     Holder 2009 with PVCs and bigeminy. Holter 1/12 with PVCs and short runs of atrial tachycardia (up to 9 beats)  . GERD (gastroesophageal reflux disease)   . S/P left heart catheterization by percutaneous approach 2009    Apparently done after patient passed out during a treadmill stress test. Luminal irregularities only in coronaries. EF 60%  . Fatigue   . 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    ROS: [x]  Positive   [ ]  Negative   [ ]  All sytems reviewed and are negative  General: [ ]  Weight loss, [ ]  Fever, [ ]  chills positive for weight gain Neurologic: [x ] Dizziness, [ ]  Blackouts, [ ]  Seizure [ ]  Stroke, [ ]  "Mini stroke", [ ]  Slurred speech, [ ]  Temporary blindness; [ ]  weakness in arms or legs, [ ]  Hoarseness Cardiac: [ ]  Chest pain/pressure, [ ]  Shortness of breath at rest [ ]  Shortness of breath with exertion, [ ]  Atrial fibrillation or irregular heartbeat positive for palpitations Vascular: [x ] Pain in legs with walking, [ ]  Pain in legs at rest, [ ]  Pain in legs at night,  [ ]  Non-healing ulcer, [ ]  Blood  clot in vein/DVT,   Pulmonary: [ ]  Home oxygen, [ ]  Productive cough, [ ]  Coughing up blood, [ ]  Asthma,  [ ]  Wheezing Musculoskeletal:  [ ]  Arthritis, [ ]  Low back pain, [ ]  Joint pain Hematologic: [ ]  Easy Bruising, [ ]  Anemia; [ ]  Hepatitis Gastrointestinal: [ ]  Blood in stool, [x ] Gastroesophageal Reflux/heartburn, [ ]  Trouble swallowing Urinary: [ ]  chronic Kidney disease, [ ]  on HD - [ ]  MWF or [ ]  TTHS, [ ]  Burning with urination, [ ]  Difficulty urinating Skin: [ ]  Rashes, [ ]  Wounds Psychological: [ ]  Anxiety, [ ]  Depression   Social History History  Substance Use Topics  . Smoking status: Never Smoker   . Smokeless tobacco: Never Used  . Alcohol Use: Yes     Rarely    Family History Family History  Problem Relation Age of Onset  . Coronary artery disease Neg Hx     Premature  . Stroke Other     Grandfather  . Cancer Sister     breast    No Known Allergies  Current Outpatient Prescriptions  Medication Sig Dispense Refill  . esomeprazole (NEXIUM) 40 MG capsule Take 1 capsule (40 mg total) by mouth daily.  30 capsule  3  . MAGNESIUM PO Take 1 tablet by mouth daily.        . metoprolol succinate (TOPROL-XL) 25 MG 24 hr tablet Take 25 mg by mouth 2 (two) times daily.  Physical Examination  Filed Vitals:   02/02/11 1010  BP: 132/62  Pulse: 56  Resp: 20    Body mass index is 32.72 kg/(m^2).  General:  WDWN in NAD Gait: Normal HENT: WNL Eyes: Pupils equal Pulmonary: normal non-labored breathing , without Rales, rhonchi,  wheezing Cardiac: RRR, without  Murmurs, rubs or gallops; No carotid bruits Abdomen: soft, NT, no masses Skin: no rashes, ulcers noted Vascular Exam/Pulses: 3+ femoral popliteal and dorsalis pedis pulses are palpable bilaterally. She has bulging varicosities in both legs right worse than left. These are in the great saphenous distribution in the medial thigh and medial calf. There is 1+ edema bilaterally from the midcalf to the  ankles. She has diffuse spider and reticular veins bilaterally in the lower third of the leg. No active ulcerations are noted. There is some early hyperpigmentation in both legs from the midcalf to the ankle. Extremities without ischemic changes, no Gangrene , no cellulitis; no open wounds;  Musculoskeletal: no muscle wasting or atrophy  Neurologic: A&O X 3; Appropriate Affect ; SENSATION: normal; MOTOR FUNCTION:  moving all extremities equally. Speech is fluent/normal  Non-Invasive Vascular Imaging: Today I performed a bedside sono site ultrasound exam. This reveals gross reflux in both great saphenous systems from the saphenofemoral junction throughout the great saphenous veins.  ASSESSMENT: Bilateral venous insufficiency with painful varicosities do to valvular incompetence of the great saphenous systems. 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

## 2011-02-23 ENCOUNTER — Ambulatory Visit: Payer: BC Managed Care – PPO | Admitting: Internal Medicine

## 2011-02-23 ENCOUNTER — Ambulatory Visit: Payer: Self-pay

## 2011-02-27 ENCOUNTER — Telehealth: Payer: Self-pay | Admitting: *Deleted

## 2011-02-27 NOTE — Telephone Encounter (Signed)
Pt sent email to Dr Darrick Huntsman - upset that she was unable to get in for OV. If patient did not go to UC we need to see patient for OV today per MD. I left pt VM to call office back and ask to speak with me. I then called patient's husband - he said patient was seen by UC, treated with Tamiflu and starting feeling better Wednesday. I apologized that we were unable to see her when she was sick but we have now put in place processes to be able to see sick patients. Advised him to have her come in if she did not continue to feel better or became worse.

## 2011-03-03 ENCOUNTER — Other Ambulatory Visit: Payer: Self-pay | Admitting: Obstetrics and Gynecology

## 2011-03-03 DIAGNOSIS — Z1231 Encounter for screening mammogram for malignant neoplasm of breast: Secondary | ICD-10-CM

## 2011-03-24 ENCOUNTER — Ambulatory Visit
Admission: RE | Admit: 2011-03-24 | Discharge: 2011-03-24 | Disposition: A | Discharge status: Home / Self Care | Payer: BC Managed Care – PPO | Source: Ambulatory Visit | Attending: Obstetrics and Gynecology | Admitting: Obstetrics and Gynecology

## 2011-03-24 DIAGNOSIS — Z1231 Encounter for screening mammogram for malignant neoplasm of breast: Secondary | ICD-10-CM

## 2011-04-27 ENCOUNTER — Ambulatory Visit (INDEPENDENT_AMBULATORY_CARE_PROVIDER_SITE_OTHER): Payer: BC Managed Care – PPO | Admitting: Internal Medicine

## 2011-04-27 ENCOUNTER — Other Ambulatory Visit (INDEPENDENT_AMBULATORY_CARE_PROVIDER_SITE_OTHER): Payer: BC Managed Care – PPO | Admitting: *Deleted

## 2011-04-27 ENCOUNTER — Telehealth: Payer: Self-pay | Admitting: Internal Medicine

## 2011-04-27 ENCOUNTER — Encounter: Payer: Self-pay | Admitting: Internal Medicine

## 2011-04-27 VITALS — BP 124/80 | HR 68 | Temp 98.4°F | Wt 186.0 lb

## 2011-04-27 DIAGNOSIS — R739 Hyperglycemia, unspecified: Secondary | ICD-10-CM

## 2011-04-27 DIAGNOSIS — K625 Hemorrhage of anus and rectum: Secondary | ICD-10-CM

## 2011-04-27 DIAGNOSIS — E669 Obesity, unspecified: Secondary | ICD-10-CM

## 2011-04-27 DIAGNOSIS — R7309 Other abnormal glucose: Secondary | ICD-10-CM

## 2011-04-27 DIAGNOSIS — K649 Unspecified hemorrhoids: Secondary | ICD-10-CM

## 2011-04-27 LAB — COMPREHENSIVE METABOLIC PANEL
ALT: 16 U/L (ref 0–35)
AST: 22 U/L (ref 0–37)
Alkaline Phosphatase: 48 U/L (ref 39–117)
CO2: 25 mEq/L (ref 19–32)
Calcium: 8.8 mg/dL (ref 8.4–10.5)
Creatinine, Ser: 0.7 mg/dL (ref 0.4–1.2)
Potassium: 4 mEq/L (ref 3.5–5.1)
Sodium: 138 mEq/L (ref 135–145)
Total Bilirubin: 0.7 mg/dL (ref 0.3–1.2)
Total Protein: 7.3 g/dL (ref 6.0–8.3)

## 2011-04-27 LAB — LIPID PANEL
Cholesterol: 190 mg/dL (ref 0–200)
Total CHOL/HDL Ratio: 3
Triglycerides: 103 mg/dL (ref 0.0–149.0)

## 2011-04-27 MED ORDER — HYDROCORTISONE 2.5 % RE CREA: TOPICAL_CREAM | Freq: Two times a day (BID) | RECTAL | Status: AC

## 2011-04-27 NOTE — Telephone Encounter (Signed)
Patient added to Dr. Melina Schools schedule for today.

## 2011-04-27 NOTE — Progress Notes (Signed)
Subjective:    Patient ID: Alexis Bradley, female    DOB: 1965-10-17, 45 y.o.   MRN: [id number]  HPI   Alexis Bradley is a 45 yo white female with a history of gastric polyps by March 2012 endoscopy who presents with rectal bleeding.  First occurrence was noted with blood on toilet paper after any  use of bathroom (stool, urine) one week ago.  Has not had any bleeding since for the last 4 days but prior to resolution of bleeding she had also soiled her underwear .  She denies any history of constipation or painful BMs and has no history of bleeding diathesis.  No prior colonoscopy.   Past Medical History  Diagnosis Date  . Palpitations     Holder 2009 with PVCs and bigeminy. Holter 1/12 with PVCs and short runs of atrial tachycardia (up to 9 beats)  . S/P left heart catheterization by percutaneous approach 2009    Apparently done after patient passed out during a treadmill stress test. Luminal irregularities only in coronaries. EF 60%  . Fatigue   . 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 GERD (gastroesophageal reflux disease) Feb 2011    esophagitis by EGD Feb 2011  . Gastric polyps March 2012    Endoscopy   Current Outpatient Prescriptions on File Prior to Visit  Medication Sig Dispense Refill  . esomeprazole (NEXIUM) 40 MG capsule Take 1 capsule (40 mg total) by mouth daily.  30 capsule  3  . MAGNESIUM PO Take 1 tablet by mouth daily.        . metoprolol succinate (TOPROL-XL) 25 MG 24 hr tablet Take 25 mg by mouth 2 (two) times daily.         Review of Systems  Constitutional: Negative for fever, chills and unexpected weight change.  HENT: Negative for hearing loss, ear pain, nosebleeds, congestion, sore throat, facial swelling, rhinorrhea, sneezing, mouth sores, trouble swallowing, neck pain, neck stiffness, voice change,  postnasal drip, sinus pressure, tinnitus and ear discharge.   Eyes: Negative for pain, discharge, redness and visual disturbance.  Respiratory: Negative for cough, chest tightness, shortness of breath, wheezing and stridor.   Cardiovascular: Negative for chest pain, palpitations and leg swelling.  Musculoskeletal: Negative for myalgias and arthralgias.  Skin: Negative for color change and rash.  Neurological: Negative for dizziness, weakness, light-headedness and headaches.  Hematological: Negative for adenopathy.       Objective:   Physical Exam  Constitutional: She is oriented to person, place, and time. She appears well-developed and well-nourished.  HENT:  Mouth/Throat: Oropharynx is clear and moist.  Eyes: EOM are normal. Pupils are equal, round, and reactive to light. No scleral icterus.  Neck: Normal range of motion. Neck supple. No JVD present. No thyromegaly present.  Cardiovascular: Normal rate, regular rhythm, normal heart sounds and intact distal pulses.   Pulmonary/Chest: Effort normal and breath sounds normal.  Abdominal: Soft. Bowel sounds are normal. She exhibits no mass. There is no tenderness.  Genitourinary:     Musculoskeletal: Normal range of motion. She exhibits no edema.  Lymphadenopathy:    She has no cervical adenopathy.  Neurological: She is alert and oriented to person, place, and time.  Skin: Skin is warm and dry.  Psychiatric: She has a normal mood and affect.          Assessment &  Plan:

## 2011-04-27 NOTE — Telephone Encounter (Signed)
[phone] Pt came in for labs today and she mention that she has rectal bleeding it is bright red and she has to wear pad she has an appointment with dr Darrick Huntsman on 05/01/11

## 2011-04-28 ENCOUNTER — Encounter: Payer: Self-pay | Admitting: Internal Medicine

## 2011-04-28 DIAGNOSIS — K317 Polyp of stomach and duodenum: Secondary | ICD-10-CM | POA: Insufficient documentation

## 2011-04-28 DIAGNOSIS — K625 Hemorrhage of anus and rectum: Secondary | ICD-10-CM | POA: Insufficient documentation

## 2011-04-28 NOTE — Assessment & Plan Note (Signed)
Her internal exam was normal, but she has large external hemorrhoids, but given her history of gastric polyps,  Will refer to [facility], Lina Sar, for colonoscopy to rule out colonic polyps.  Rx for anusol hc cream  To use

## 2011-05-01 ENCOUNTER — Ambulatory Visit: Payer: BC Managed Care – PPO | Admitting: Internal Medicine

## 2011-05-08 ENCOUNTER — Encounter: Payer: Self-pay | Admitting: Vascular Surgery

## 2011-05-11 ENCOUNTER — Encounter: Payer: Self-pay | Admitting: Vascular Surgery

## 2011-05-11 ENCOUNTER — Other Ambulatory Visit (INDEPENDENT_AMBULATORY_CARE_PROVIDER_SITE_OTHER): Payer: BC Managed Care – PPO | Admitting: *Deleted

## 2011-05-11 ENCOUNTER — Ambulatory Visit (INDEPENDENT_AMBULATORY_CARE_PROVIDER_SITE_OTHER): Payer: BC Managed Care – PPO | Admitting: Vascular Surgery

## 2011-05-11 VITALS — BP 115/61 | HR 59 | Resp 16 | Ht 64.0 in | Wt 185.0 lb

## 2011-05-11 DIAGNOSIS — I83893 Varicose veins of bilateral lower extremities with other complications: Secondary | ICD-10-CM | POA: Insufficient documentation

## 2011-05-11 DIAGNOSIS — I8310 Varicose veins of unspecified lower extremity with inflammation: Secondary | ICD-10-CM

## 2011-05-11 NOTE — Progress Notes (Signed)
Subjective:     Patient ID: Alexis Bradley, female   DOB: [birth year], 46 y.o.   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 She has bulging varicosities in both thigh and calf areas and chronic edema in both ankles which is worsening. This has not improved with long leg elastic compression stockings (20-30 mm gradient). She is unable to elevate her legs while teaching and does take ibuprofen with no improvement. She has no history of DVT. He aching throbbing and burning discomfort in the chronic edema continues to worsen affect her daily living and ability to teach.  Past Medical History  Diagnosis Date  . Palpitations     Holder 2009 with PVCs and bigeminy. Holter 1/12 with PVCs and short runs of atrial tachycardia (up to 9 beats)  . S/P left heart catheterization by percutaneous approach 2009    Apparently done after patient passed out during a treadmill stress test. Luminal irregularities only in coronaries. EF 60%  . Fatigue   . 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 GERD (gastroesophageal reflux disease) Feb 2011    esophagitis by EGD Feb 2011  . Gastric polyps March 2012    Endoscopy    History  Substance Use Topics  . Smoking status: Never Smoker   . Smokeless tobacco: Never Used  . Alcohol Use: Yes     Rarely    Family History  Problem Relation Age of Onset  . Coronary artery disease Neg Hx     Premature  . Stroke Other     Grandfather  . Cancer Sister     breast    No Known Allergies  Current outpatient prescriptions:esomeprazole (NEXIUM) 40 MG capsule, Take 1 capsule (40 mg total) by mouth daily., Disp: 30 capsule, Rfl: 3;  MAGNESIUM PO, Take 1 tablet by mouth daily.  , Disp: , Rfl: ;  metoprolol  succinate (TOPROL-XL) 25 MG 24 hr tablet, Take 25 mg by mouth 2 (two) times daily.  , Disp: , Rfl: ;  Multiple Vitamin (MULTIVITAMIN) tablet, Take 1 tablet by mouth daily., Disp: , Rfl:   BP 115/61  Pulse 59  Resp 16  Ht 5\' 4"  (1.626 m)  Wt 185 lb (83.915 kg)  BMI 31.76 kg/m2  SpO2 100%  Body mass index is 31.76 kg/(m^2).        Review of Systems     Objective:   Physical Exam blood pressure 115/61 heart rate 59 respirations 16 Lower extremity exam the ulcer plus femoral and dorsalis pedis pulses palpable bilaterally. She has bulging varicosities in both medial thigh and calf areas in the great saphenous system with 1+ edema and diffuse spider and reticular veins in lower third of the leg but no active ulceration. She is early hyperpigmentation.  Today I ordered bilateral venous reflux studies and reviewed and interpreted these. She has gross reflux throughout both great saphenous systems from the knee to the saphenofemoral junction. She also has reflux in both deep systems.     Assessment:     Severe venous insufficiency both lower extremities with gross reflux in great saphenous systems bilaterally with secondary bulging varicosities and distal edema. Refractory to conservative treatment affecting her daily living and ability to work    Plan:     Patient needs #1  laser ablation right great saphenous vein with greater than 20 stab phlebectomy followed by #2 laser ablation left great saphenous vein with greater than 20 stab phlebectomy. Will proceed with precertification to perform this in the near future

## 2011-05-13 NOTE — Procedures (Unsigned)
LOWER EXTREMITY VENOUS REFLUX EXAM  INDICATION:  Bilateral varicose veins.  EXAM:  Using color-flow imaging and pulse Doppler spectral analysis, the right and left common femoral, superficial femoral, popliteal, posterior tibial, greater and lesser saphenous veins are evaluated.  There is evidence suggesting deep venous insufficiency in the right and left lower extremities.  The right and left saphenofemoral junctions are not competent with reflux of >500 milliseconds. The right and left GSV's are not competent with reflux of >500 milliseconds with the caliber as described below.   The right and left proximal small saphenous vein demonstrates competency.  GSV Diameter (used if found to be incompetent only)                                           Right    Left Proximal Greater Saphenous Vein           1.00 cm  1.09 cm Proximal-to-mid-thigh                     0.63 cm  0.82 cm Mid thigh                                 0.93 cm  0.65 cm Mid-distal thigh                          cm       cm Distal thigh                              1.14 cm  1.71 cm Knee                                      0.69 cm  0.78 cm  IMPRESSION: 1. The right and left greater saphenous is not competent with Reflux     >500 milliseconds. 2. The left greater saphenous vein is tortuous below the knee. 3. The deep venous system is not competent with Reflux of >500     milliseconds. 4. The right and left small saphenous vein is competent.  ___________________________________________ Quita Skye. Hart Rochester, M.D.  EM/MEDQ  D:  05/11/2011  T:  05/11/2011  Job:  [id number]

## 2011-05-15 ENCOUNTER — Encounter: Payer: Self-pay | Admitting: Internal Medicine

## 2011-05-16 ENCOUNTER — Encounter: Payer: Self-pay | Admitting: Internal Medicine

## 2011-05-22 ENCOUNTER — Encounter: Payer: Self-pay | Admitting: Vascular Surgery

## 2011-05-25 ENCOUNTER — Ambulatory Visit (INDEPENDENT_AMBULATORY_CARE_PROVIDER_SITE_OTHER): Payer: BC Managed Care – PPO | Admitting: Vascular Surgery

## 2011-05-25 ENCOUNTER — Encounter: Payer: Self-pay | Admitting: Vascular Surgery

## 2011-05-25 VITALS — BP 141/76 | HR 55 | Resp 20 | Ht 64.0 in | Wt 185.0 lb

## 2011-05-25 DIAGNOSIS — I83893 Varicose veins of bilateral lower extremities with other complications: Secondary | ICD-10-CM

## 2011-05-25 HISTORY — PX: OTHER SURGICAL HISTORY: SHX169

## 2011-05-25 NOTE — Progress Notes (Signed)
Laser Ablation Procedure      Date: 05/25/2011    Alexis Bradley DOB:1966/03/12  Consent signed: Yes  Surgeon:J.D. Hart Rochester  Procedure: Laser Ablation: right Greater Saphenous Vein  BP 141/76  Pulse 55  Resp 20  Ht 5\' 4"  (1.626 m)  Wt 185 lb (83.915 kg)  BMI 31.76 kg/m2  Start time: 1:00   End time: 2:05  Tumescent Anesthesia: 475 cc 0.9% NaCl with 50 cc Lidocaine HCL with 1% Epi and 15 cc 8.4% NaHCO3  Local Anesthesia: 10 cc Lidocaine HCL and NaHCO3 (ratio 2:1)  Pulsed mode:yes Watts 15 Seconds 1 Pulses:1 Total Pulses:166 Total Energy: 2442 Total Time: 2:42   Patient tolerated procedure well: Yes    Description of Procedure:  After marking the course of the saphenous vein and the secondary varicosities in the standing position, the patient was placed on the operating table in the supine position, and the right leg was prepped and draped in sterile fashion. Local anesthetic was administered, and under ultrasound guidance the saphenous vein was accessed with a micro needle and guide wire; then the micro puncture sheath was placed. A guide wire was inserted to the saphenofemoral junction, followed by a 5 french sheath.  The position of the sheath and then the laser fiber below the junction was confirmed using the ultrasound and visualization of the aiming beam.  Tumescent anesthesia was administered along the course of the saphenous vein using ultrasound guidance. Protective laser glasses were placed on the patient, and the laser was fired at 15 watt pulsed mode advancing 1-2 mm per sec.  For a total of 2442 joules.  A steri strip was applied to the puncture site.  Stab Phlebectomies 10-20 were  performed using a hemostat to avulse these varicosities.  Adequate hemostasis was achieved, and steri strips were applied to the stab wound.     ABD pads and thigh high compression stockings were applied.  Ace wrap bandages were applied over the phlebectomy sites and at the top of the  saphenofemoral junction.  Blood loss was less than 15 cc.  The patient ambulated out of the operating room having tolerated the procedure well.

## 2011-05-25 NOTE — Progress Notes (Signed)
Subjective:     Patient ID: Alexis Bradley, female   DOB: [birth year], 46 y.o.   MRN: [id number]  HPI this 46 year old female had laser ablation of the right great saphenous vein and 10-20 stab phlebectomy of secondary varicosities in the medial calf area. This was done under local tumescent anesthesia and the patient tolerated this well.   Review of Systems     Objective:   Physical ExamBP 141/76  Pulse 55  Resp 20  Ht 5\' 4"  (1.626 m)  Wt 185 lb (83.915 kg)  BMI 31.76 kg/m2    Assessment:     Well-tolerated laser ablation right great saphenous vein with 10-20 stab phlebectomy of secondary varicosities right calf under local tumescent anesthesia    Plan:     Return in one week for venous duplex exam right great saphenous vein to confirm closure. We'll then schedule for contralateral leg procedure near future

## 2011-05-26 ENCOUNTER — Telehealth: Payer: Self-pay | Admitting: *Deleted

## 2011-05-26 NOTE — Telephone Encounter (Signed)
Left a voice mail with instructions. Asked her to call if she has concerns. Reminded her of her appt. Next week.

## 2011-05-27 ENCOUNTER — Other Ambulatory Visit: Payer: Self-pay | Admitting: *Deleted

## 2011-05-27 DIAGNOSIS — I83899 Varicose veins of unspecified lower extremities with other complications: Secondary | ICD-10-CM

## 2011-06-01 ENCOUNTER — Encounter: Payer: Self-pay | Admitting: Vascular Surgery

## 2011-06-02 ENCOUNTER — Encounter: Payer: Self-pay | Admitting: Internal Medicine

## 2011-06-02 ENCOUNTER — Encounter: Payer: Self-pay | Admitting: Vascular Surgery

## 2011-06-02 ENCOUNTER — Ambulatory Visit (INDEPENDENT_AMBULATORY_CARE_PROVIDER_SITE_OTHER): Payer: BC Managed Care – PPO | Admitting: Vascular Surgery

## 2011-06-02 ENCOUNTER — Encounter (INDEPENDENT_AMBULATORY_CARE_PROVIDER_SITE_OTHER): Payer: BC Managed Care – PPO | Admitting: *Deleted

## 2011-06-02 ENCOUNTER — Ambulatory Visit (AMBULATORY_SURGERY_CENTER): Payer: BC Managed Care – PPO

## 2011-06-02 VITALS — Ht 64.0 in | Wt 190.0 lb

## 2011-06-02 VITALS — BP 139/78 | HR 60 | Resp 18 | Ht 64.0 in | Wt 185.0 lb

## 2011-06-02 DIAGNOSIS — Z48812 Encounter for surgical aftercare following surgery on the circulatory system: Secondary | ICD-10-CM

## 2011-06-02 DIAGNOSIS — I83893 Varicose veins of bilateral lower extremities with other complications: Secondary | ICD-10-CM

## 2011-06-02 DIAGNOSIS — Z1211 Encounter for screening for malignant neoplasm of colon: Secondary | ICD-10-CM

## 2011-06-02 DIAGNOSIS — K625 Hemorrhage of anus and rectum: Secondary | ICD-10-CM

## 2011-06-02 MED ORDER — PEG-KCL-NACL-NASULF-NA ASC-C 100 G PO SOLR: 1.0000 | Freq: Once | ORAL | Status: AC

## 2011-06-02 NOTE — Progress Notes (Signed)
Subjective:     Patient ID: Alexis Bradley, female   DOB: Feb 04, 1966, 46 y.o.   MRN: [id number]  HPI and this 46 year old female returns 1 week post laser ablation right great saphenous vein with greater than 20 stab phlebectomy. She has had some mild discomfort along the course of the right great saphenous vein. She has no distal edema. She's had no pain at the stab phlebectomy sites. He denies chest pain or hemoptysis.  Review of Systems     Objective:   Physical ExamBP 139/78  Pulse 60  Resp 18  Ht 5\' 4"  (1.626 m)  Wt 185 lb (83.915 kg)  BMI 31.76 kg/m2 Right lower extremity   has mild tenderness along the course of the great saphenous vein from the knee to the saphenofemoral junction. There is distal edema which is mild. Stab phlebectomy sites are healing normally.  Today I ordered a venous duplex exam of the right leg which are reviewed and interpreted. GSC is totally occluded up to the common femoral vein at the junction. There is no DVT. Assessment:     Well-tolerated unsuccessful laser ablation right GSVplus stab phlebectomy    Plan:     Return on February 25 for similar procedure on the contralateral left leg

## 2011-06-09 NOTE — Procedures (Unsigned)
DUPLEX DEEP VENOUS EXAM - LOWER EXTREMITY  INDICATION:  One week followup right GSV EVLT.  HISTORY:  Edema:  No Trauma/Surgery:  EVLT Pain:  Tenderness PE:  No Previous DVT:  No Anticoagulants:  No Other:  DUPLEX EXAM:               CFV   SFV   PopV  PTV    GSV               R  L  R  L  R  L  R   L  R  L Thrombosis    +     o     o     o      + Spontaneous   +     +     +     +      o Phasic        +     +     +     +      o Augmentation  +     +     +     +      o Compressible  P     +     +     +      o Competent  Legend:  + - yes  o - no  p - partial  D - decreased  IMPRESSION: 1. Successful ablation of right great saphenous vein with mild     extension into the common femoral vein that is nonocclusive. 2. All other deep veins of the right lower extremity are normal.   _____________________________ Quita Skye. Hart Rochester, M.D.  LT/MEDQ  D:  06/02/2011  T:  06/02/2011  Job:  [id number]

## 2011-06-16 ENCOUNTER — Encounter: Payer: Self-pay | Admitting: Internal Medicine

## 2011-06-16 ENCOUNTER — Ambulatory Visit (AMBULATORY_SURGERY_CENTER): Payer: BC Managed Care – PPO | Admitting: Internal Medicine

## 2011-06-16 DIAGNOSIS — K625 Hemorrhage of anus and rectum: Secondary | ICD-10-CM

## 2011-06-16 DIAGNOSIS — D126 Benign neoplasm of colon, unspecified: Secondary | ICD-10-CM

## 2011-06-16 DIAGNOSIS — Z1211 Encounter for screening for malignant neoplasm of colon: Secondary | ICD-10-CM

## 2011-06-16 MED ORDER — HYDROCORTISONE ACETATE 25 MG RE SUPP: 25.0000 mg | Freq: Two times a day (BID) | RECTAL | Status: DC

## 2011-06-16 MED ORDER — SODIUM CHLORIDE 0.9 % IV SOLN: 500.0000 mL | INTRAVENOUS | Status: DC

## 2011-06-16 NOTE — Op Note (Signed)
[facility] [address] [place], [place]  [address]  COLONOSCOPY PROCEDURE REPORT  PATIENT:  Alexis Bradley, Alexis Bradley  MR#:  [id number] BIRTHDATE:  13-Aug-1965, 45 yrs. old  GENDER:  female ENDOSCOPIST:  Hedwig Morton. Juanda Chance, MD REF. BY:  Alain Honey, M.D. PROCEDURE DATE:  06/16/2011 PROCEDURE:  Colonoscopy with biopsy ASA CLASS:  Class I INDICATIONS:  Routine Risk Screening, hematochezia several episodes of hematochezia since Christmas 2012 MEDICATIONS:   MAC sedation, administered by CRNA, propofol (Diprivan) 300 mg  DESCRIPTION OF PROCEDURE:   After the risks and benefits and of the procedure were explained, informed consent was obtained. Digital rectal exam was performed and revealed no rectal masses. The LB CF-[id number] endoscope was introduced through the anus and advanced to the cecum, which was identified by both the appendix and ileocecal valve.  The quality of the prep was excellent, using MoviPrep.  The instrument was then slowly withdrawn as the colon was fully examined. <<PROCEDUREIMAGES>>  FINDINGS:  A sessile polyp was found. 3 mm polyp at 20 cm The polyp was removed using cold biopsy forceps (see image4 and image3).  Internal and external Hemorrhoids were found (see image5).  This was otherwise a normal examination of the colon (see image2 and image1).   Retroflexed views in the rectum revealed no abnormalities.    The scope was then withdrawn from the patient and the procedure completed.  COMPLICATIONS:  None ENDOSCOPIC IMPRESSION: 1) Sessile polyp 2) Internal and external hemorrhoids 3) Otherwise normal examination hematochezia likely secondary to the hemorrhoids RECOMMENDATIONS: 1) Await pathology results Anusol HC supp 1 hs  REPEAT EXAM:  In 10 year(s) for.  ______________________________ Hedwig Morton. Juanda Chance, MD  CC:  n. eSIGNED:   Hedwig Morton. Tameah Mihalko at 06/16/2011 10:54 AM  Denton Brick, [id number]

## 2011-06-16 NOTE — Progress Notes (Signed)
Patient did not experience any of the following events: a burn prior to discharge; a fall within the facility; wrong site/side/patient/procedure/implant event; or a hospital transfer or hospital admission upon discharge from the facility. (G8907) Patient did not have preoperative order for IV antibiotic SSI prophylaxis. (G8918)  

## 2011-06-16 NOTE — Patient Instructions (Signed)
YOU HAD AN ENDOSCOPIC PROCEDURE TODAY AT THE [facility]: Refer to the procedure report that was given to you for any specific questions about what was found during the examination.  If the procedure report does not answer your questions, please call your gastroenterologist to clarify.  If you requested that your care partner not be given the details of your procedure findings, then the procedure report has been included in a sealed envelope for you to review at your convenience later.  YOU SHOULD EXPECT: Some feelings of bloating in the abdomen. Passage of more gas than usual.  Walking can help get rid of the air that was put into your GI tract during the procedure and reduce the bloating. If you had a lower endoscopy (such as a colonoscopy or flexible sigmoidoscopy) you may notice spotting of blood in your stool or on the toilet paper. If you underwent a bowel prep for your procedure, then you may not have a normal bowel movement for a few days.  DIET: Your first meal following the procedure should be a light meal and then it is ok to progress to your normal diet.  A half-sandwich or bowl of soup is an example of a good first meal.  Heavy or fried foods are harder to digest and may make you feel nauseous or bloated.  Likewise meals heavy in dairy and vegetables can cause extra gas to form and this can also increase the bloating.  Drink plenty of fluids but you should avoid alcoholic beverages for 24 hours.  ACTIVITY: Your care partner should take you home directly after the procedure.  You should plan to take it easy, moving slowly for the rest of the day.  You can resume normal activity the day after the procedure however you should NOT DRIVE or use heavy machinery for 24 hours (because of the sedation medicines used during the test).    SYMPTOMS TO REPORT IMMEDIATELY: A gastroenterologist can be reached at any hour.  During normal business hours, 8:30 AM to 5:00 PM Monday through Friday,  call [phone].  After hours and on weekends, please call the GI answering service at [phone] who will take a message and have the physician on call contact you.   Following lower endoscopy (colonoscopy or flexible sigmoidoscopy):  Excessive amounts of blood in the stool  Significant tenderness or worsening of abdominal pains  Swelling of the abdomen that is new, acute  Fever of 100F or higher Hemorrhoids Hemorrhoids are enlarged (dilated) veins around the rectum. There are 2 types of hemorrhoids, and the type of hemorrhoid is determined by its location. Internal hemorrhoids occur in the veins just inside the rectum.They are usually not painful, but they may bleed.However, they may poke through to the outside and become irritated and painful. External hemorrhoids involve the veins outside the anus and can be felt as a painful swelling or hard lump near the anus.They are often itchy and may crack and bleed. Sometimes clots will form in the veins. This makes them swollen and painful. These are called thrombosed hemorrhoids. CAUSES Causes of hemorrhoids include:  Pregnancy. This increases the pressure in the hemorrhoidal veins.   Constipation.   Straining to have a bowel movement.   Obesity.   Heavy lifting or other activity that caused you to strain.  TREATMENT Most of the time hemorrhoids improve in 1 to 2 weeks. However, if symptoms do not seem to be getting better or if you have a lot of rectal  bleeding, your caregiver may perform a procedure to help make the hemorrhoids get smaller or remove them completely.Possible treatments include:  Rubber band ligation. A rubber band is placed at the base of the hemorrhoid to cut off the circulation.   Sclerotherapy. A chemical is injected to shrink the hemorrhoid.   Infrared light therapy. Tools are used to burn the hemorrhoid.   Hemorrhoidectomy. This is surgical removal of the hemorrhoid.  HOME CARE INSTRUCTIONS    Increase fiber in your diet. Ask your caregiver about using fiber supplements.   Drink enough water and fluids to keep your urine clear or pale yellow.   Exercise regularly.   Go to the bathroom when you have the urge to have a bowel movement. Do not wait.   Avoid straining to have bowel movements.   Keep the anal area dry and clean.   Only take over-the-counter or prescription medicines for pain, discomfort, or fever as directed by your caregiver.  If your hemorrhoids are thrombosed:  Take warm sitz baths for 20 to 30 minutes, 3 to 4 times per day.   If the hemorrhoids are very tender and swollen, place ice packs on the area as tolerated. Using ice packs between sitz baths may be helpful. Fill a plastic bag with ice. Place a towel between the bag of ice and your skin.   Medicated creams and suppositories may be used or applied as directed.   Do not use a donut-shaped pillow or sit on the toilet for long periods. This increases blood pooling and pain.  SEEK MEDICAL CARE IF:   You have increasing pain and swelling that is not controlled with your medicine.   You have uncontrolled bleeding.   You have difficulty or you are unable to have a bowel movement.   You have pain or inflammation outside the area of the hemorrhoids.   You have chills or an oral temperature above 102 F (38.9 C).  MAKE SURE YOU:   Understand these instructions.   Will watch your condition.   Will get help right away if you are not doing well or get worse.  Document Released: 04/10/2000 Document Revised: 12/24/2010 Document Reviewed: 08/16/2007 [facility] Patient Information 2012 [place], [place].Colon Polyps A polyp is extra tissue that grows inside your body. Colon polyps grow in the large intestine. The large intestine, also called the colon, is part of your digestive system. It is a long, hollow tube at the end of your digestive tract where your body makes and stores stool. Most polyps are not  dangerous. They are benign. This means they are not cancerous. But over time, some types of polyps can turn into cancer. Polyps that are smaller than a pea are usually not harmful. But larger polyps could someday become or may already be cancerous. To be safe, doctors remove all polyps and test them.  WHO GETS POLYPS? Anyone can get polyps, but certain people are more likely than others. You may have a greater chance of getting polyps if:  You are over 50.   You have had polyps before.   Someone in your family has had polyps.   Someone in your family has had cancer of the large intestine.   Find out if someone in your family has had polyps. You may also be more likely to get polyps if you:   Eat a lot of fatty foods.   Smoke.   Drink alcohol.   Do not exercise.   Eat too much.  SYMPTOMS  Most small polyps do not cause symptoms. People often do not know they have one until their caregiver finds it during a regular checkup or while testing them for something else. Some people do have symptoms like these:  Bleeding from the anus. You might notice blood on your underwear or on toilet paper after you have had a bowel movement.   Constipation or diarrhea that lasts more than a week.   Blood in the stool. Blood can make stool look black or it can show up as red streaks in the stool.  If you have any of these symptoms, see your caregiver. HOW DOES THE DOCTOR TEST FOR POLYPS? The doctor can use four tests to check for polyps:  Digital rectal exam. The caregiver wears gloves and checks your rectum (the last part of the large intestine) to see if it feels normal. This test would find polyps only in the rectum. Your caregiver may need to do one of the other tests listed below to find polyps higher up in the intestine.   Barium enema. The caregiver puts a liquid called barium into your rectum before taking x-rays of your large intestine. Barium makes your intestine look white in the  pictures. Polyps are dark, so they are easy to see.   Sigmoidoscopy. With this test, the caregiver can see inside your large intestine. A thin flexible tube is placed into your rectum. The device is called a sigmoidoscope, which has a light and a tiny video camera in it. The caregiver uses the sigmoidoscope to look at the last third of your large intestine.   Colonoscopy. This test is like sigmoidoscopy, but the caregiver looks at all of the large intestine. It usually requires sedation. This is the most common method for finding and removing polyps.  TREATMENT   The caregiver will remove the polyp during sigmoidoscopy or colonoscopy. The polyp is then tested for cancer.   If you have had polyps, your caregiver may want you to get tested regularly in the future.  PREVENTION  There is not one sure way to prevent polyps. You might be able to lower your risk of getting them if you:  Eat more fruits and vegetables and less fatty food.   Do not smoke.   Avoid alcohol.   Exercise every day.   Lose weight if you are overweight.   Eating more calcium and folate can also lower your risk of getting polyps. Some foods that are rich in calcium are milk, cheese, and broccoli. Some foods that are rich in folate are chickpeas, kidney beans, and spinach.   Aspirin might help prevent polyps. Studies are under way.  Document Released: 01/08/2004 Document Revised: 12/24/2010 Document Reviewed: 06/15/2007 [facility] Patient Information 2012 [place], [place]. FOLLOW UP: If any biopsies were taken you will be contacted by phone or by letter within the next 1-3 weeks.  Call your gastroenterologist if you have not heard about the biopsies in 3 weeks.  Our staff will call the home number listed on your records the next business day following your procedure to check on you and address any questions or concerns that you may have at that time regarding the information given to you following your procedure. This is a  courtesy call and so if there is no answer at the home number and we have not heard from you through the emergency physician on call, we will assume that you have returned to your regular daily activities without incident.  SIGNATURES/CONFIDENTIALITY: You and/or your  care partner have signed paperwork which will be entered into your electronic medical record.  These signatures attest to the fact that that the information above on your After Visit Summary has been reviewed and is understood.  Full responsibility of the confidentiality of this discharge information lies with you and/or your care-partner.

## 2011-06-17 ENCOUNTER — Telehealth: Payer: Self-pay

## 2011-06-17 NOTE — Telephone Encounter (Signed)
  Follow up Call-    Actions: Left message/voicemail

## 2011-06-18 ENCOUNTER — Other Ambulatory Visit: Payer: Self-pay | Admitting: Cardiology

## 2011-06-18 DIAGNOSIS — R5381 Other malaise: Secondary | ICD-10-CM

## 2011-06-19 ENCOUNTER — Encounter: Payer: Self-pay | Admitting: Vascular Surgery

## 2011-06-20 ENCOUNTER — Other Ambulatory Visit: Payer: Self-pay | Admitting: Cardiology

## 2011-06-22 ENCOUNTER — Encounter: Payer: Self-pay | Admitting: Vascular Surgery

## 2011-06-22 ENCOUNTER — Ambulatory Visit (INDEPENDENT_AMBULATORY_CARE_PROVIDER_SITE_OTHER): Payer: BC Managed Care – PPO | Admitting: Vascular Surgery

## 2011-06-22 ENCOUNTER — Encounter: Payer: Self-pay | Admitting: Internal Medicine

## 2011-06-22 VITALS — BP 125/75 | HR 62 | Resp 20 | Ht 64.0 in | Wt 185.0 lb

## 2011-06-22 DIAGNOSIS — I83893 Varicose veins of bilateral lower extremities with other complications: Secondary | ICD-10-CM

## 2011-06-22 NOTE — Progress Notes (Signed)
Subjective:     Patient ID: Zollie Beckers, female   DOB: [birth year], 46 y.o.   MRN: [id number]  HPI this 46 year old female had laser ablation of the left great saphenous vein with 10-20 stab phlebectomy performed under local tumescent anesthesia today. She tolerated the procedure well. She had a total of 1815 J of energy utilized. He has recently had the right great saphenous vein treated also.   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    Plan:     Return on March 5 for a venous duplex exam to confirm closure left great saphenous vein.

## 2011-06-22 NOTE — Progress Notes (Signed)
Laser Ablation Procedure      Date: 06/22/2011    Alexis Bradley DOB:[birth year]  Consent signed: Yes  Surgeon:J.D. Hart Rochester  Procedure: Laser Ablation: left Greater Saphenous Vein  BP 125/75  Pulse 62  Resp 20  Ht 5\' 4"  (1.626 m)  Wt 185 lb (83.915 kg)  BMI 31.76 kg/m2  Start time: 11:05   End time: 12:20  Tumescent Anesthesia: 475 cc 0.9% NaCl with 50 cc Lidocaine HCL with 1% Epi and 15 cc 8.4% NaHCO3  Local Anesthesia: 11 cc Lidocaine HCL and NaHCO3 (ratio 2:1)  Pulsed mode: Watts 15 Seconds 1 Pulses:1 Total Pulses:121 Total Energy: 1815 Total Time: 2:01   Stab Phlebectomy: 10-20 Sites: Thigh and Calf  Patient tolerated procedure well: Yes   Description of Procedure:  After marking the course of the saphenous vein and the secondary varicosities in the standing position, the patient was placed on the operating table in the supine position, and the left leg was prepped and draped in sterile fashion. Local anesthetic was administered, and under ultrasound guidance the saphenous vein was accessed with a micro needle and guide wire; then the micro puncture sheath was placed. A guide wire was inserted to the saphenofemoral junction, followed by a 5 french sheath.  The position of the sheath and then the laser fiber below the junction was confirmed using the ultrasound and visualization of the aiming beam.  Tumescent anesthesia was administered along the course of the saphenous vein using ultrasound guidance. Protective laser glasses were placed on the patient, and the laser was fired at 15 watt pulsed mode advancing 1-2 mm per sec.  For a total of 1815 joules.  A steri strip was applied to the puncture site.  The patient was then put into Trendelenburg position.  Local anesthetic was utilized overlying the marked varicosities.  Greater than 10-20 stab wounds were made using the tip of an 11 blade; and using the vein hook,  The phlebectomies were performed using a hemostat to avulse these  varicosities.  Adequate hemostasis was achieved, and steri strips were applied to the stab wound.     ABD pads and thigh high compression stockings were applied.  Ace wrap bandages were applied over the phlebectomy sites and at the top of the saphenofemoral junction.  Blood loss was less than 15 cc.  The patient ambulated out of the operating room having tolerated the procedure well.

## 2011-06-23 ENCOUNTER — Encounter: Payer: Self-pay | Admitting: Vascular Surgery

## 2011-06-23 ENCOUNTER — Other Ambulatory Visit: Payer: Self-pay

## 2011-06-23 ENCOUNTER — Telehealth: Payer: Self-pay | Admitting: *Deleted

## 2011-06-23 ENCOUNTER — Other Ambulatory Visit (INDEPENDENT_AMBULATORY_CARE_PROVIDER_SITE_OTHER): Payer: BC Managed Care – PPO

## 2011-06-23 DIAGNOSIS — I059 Rheumatic mitral valve disease, unspecified: Secondary | ICD-10-CM

## 2011-06-23 DIAGNOSIS — R5381 Other malaise: Secondary | ICD-10-CM

## 2011-06-23 DIAGNOSIS — I359 Nonrheumatic aortic valve disorder, unspecified: Secondary | ICD-10-CM

## 2011-06-23 NOTE — Telephone Encounter (Signed)
Left a voicemail asking patient to call if she needs anything. Reminded her of her fu visit on 3/5.

## 2011-06-24 ENCOUNTER — Encounter: Payer: Self-pay | Admitting: Cardiology

## 2011-06-24 ENCOUNTER — Ambulatory Visit (INDEPENDENT_AMBULATORY_CARE_PROVIDER_SITE_OTHER): Payer: BC Managed Care – PPO | Admitting: Cardiology

## 2011-06-24 DIAGNOSIS — R002 Palpitations: Secondary | ICD-10-CM

## 2011-06-24 DIAGNOSIS — I359 Nonrheumatic aortic valve disorder, unspecified: Secondary | ICD-10-CM

## 2011-06-24 NOTE — Patient Instructions (Signed)
Your physician recommends that you schedule a follow-up appointment as needed with Dr McLean.  

## 2011-06-25 NOTE — Assessment & Plan Note (Signed)
PVCs and short runs of atrial tachycardia have been noted in the past.  She is doing well on Toprol XL with minimal palpitations.  Will continue this medication.  No further workup necessary at this time.

## 2011-06-25 NOTE — Assessment & Plan Note (Signed)
Most recent echo showed only mild aortic insufficiency.

## 2011-06-25 NOTE — Progress Notes (Signed)
PCP: Dr. Darrick Huntsman  46 yo with history of fatigue, short runs of atrial tachycardia, and  PVCs returns for evaluation of palpitations. I saw the patient back in 8/09 for palpitations/PVCs. I had set her up for a stress echo, but apparently she ended up having the test done at [facility] instead where, per her report, she passed out during the treadmill stress test and underwent a heart catheterization by Dr. Kirke Corin, showing minimal luminal irregularities and normal EF. She saw him a couple of times in 2009 and had her metoprolol increased, but no other interventions were done and it was thought that her heart was stable.   Patient was seen again in 12/11 for similar symptoms to what she had in 2009. She continued to be fatigued. The fatigue did not improved when she stopped metoprolol in the past. She continued to have frequent palpitations despite taking metoprolol 25 mg two times a day.   I had her do a holter monitor in 1/12, which showed occasional PVCs and short runs of atrial tachycardia (up to 9 beats). Echo at that time showed normal EF with mild to moderate aortic insufficiency and mild mitral regurgitation.  I put her on Toprol XL after that.  She has been doing well since I last saw her.  Palpitations are now rare.  They tend to occur when she is tired and stressed.  They will occur 2-3 times a week and are not particularly bothersome.  Mild exertional dyspnea with steps, no problems with flat ground (chronic). No chest pain. Occasional lower extremity edema.   Labs (8/09): BNP 125  Labs (1/12): BNP 50, TSH normal, HCT 40.1   \Allergies (verified):  No Known Drug Allergies   Past History:  Past Medical History:  1. PALPITATIONS (ICD-785.1): Holter 2009 with PVCs and bigeminy. Holter 1/12 with PVCs and short runs of atrial tachycardia (up to 9 beats).  2. GERD (ICD-530.81)  3. Left heart cath (2009): Apparently done after patient passed out during a treadmill stress test. Luminal  irregularities only in coronaries. EF 60%.  4. Fatigue  5. 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  Repeat echo (2/13) with EF 55-65%, mild AI, mild MR.  6. Venous insufficiency  Family History:  Family History of CVA (grandfather). No premature CAD.   Social History:  The patient is a nonsmoker. She rarely drinks alcohol.  She is married. 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 No illicit drugs.   Current Outpatient Prescriptions  Medication Sig Dispense Refill  . Ascorbic Acid (VITAMIN C) 1000 MG tablet Take 1,000 mg by mouth daily.      Marland Kitchen esomeprazole (NEXIUM) 40 MG capsule Take 1 capsule (40 mg total) by mouth daily.  30 capsule  3  . MAGNESIUM PO Take 1 tablet by mouth daily.        . metoprolol succinate (TOPROL-XL) 25 MG 24 hr tablet TAKE ONE TABLET BY MOUTH TWICE DAILY  180 tablet  0  . Multiple Vitamin (MULTIVITAMIN) tablet Take 1 tablet by mouth daily.        BP 120/74  Pulse 60  Ht 5\' 5"  (1.651 m)  Wt 188 lb 6.4 oz (85.458 kg)  BMI 31.35 kg/m2 General: NAD Neck: No JVD, no thyromegaly or thyroid nodule.  Lungs: Clear to auscultation bilaterally with normal respiratory effort. CV: Nondisplaced  PMI.  Heart regular S1/S2, no S3/S4, no murmur.  1+ ankle edema with lower leg varicosities.  No carotid bruit.  Normal pedal pulses.  Abdomen: Soft, nontender, no hepatosplenomegaly, no distention.  Neurologic: Alert and oriented x 3.  Psych: Normal affect. Extremities: No clubbing or cyanosis.

## 2011-06-29 ENCOUNTER — Encounter: Payer: Self-pay | Admitting: Vascular Surgery

## 2011-06-30 ENCOUNTER — Encounter: Payer: Self-pay | Admitting: Vascular Surgery

## 2011-06-30 ENCOUNTER — Encounter (INDEPENDENT_AMBULATORY_CARE_PROVIDER_SITE_OTHER): Payer: BC Managed Care – PPO | Admitting: *Deleted

## 2011-06-30 ENCOUNTER — Ambulatory Visit (INDEPENDENT_AMBULATORY_CARE_PROVIDER_SITE_OTHER): Payer: BC Managed Care – PPO | Admitting: Vascular Surgery

## 2011-06-30 VITALS — BP 136/78 | HR 86 | Resp 20 | Ht 64.0 in | Wt 188.0 lb

## 2011-06-30 DIAGNOSIS — I83893 Varicose veins of bilateral lower extremities with other complications: Secondary | ICD-10-CM

## 2011-06-30 DIAGNOSIS — Z48812 Encounter for surgical aftercare following surgery on the circulatory system: Secondary | ICD-10-CM

## 2011-06-30 NOTE — Progress Notes (Signed)
Subjective:     Patient ID: Alexis Bradley, female   DOB: 06-Oct-1965, 46 y.o.   MRN: [id number]  HPI this 46 year old female is one week post laser ablation of the left great saphenous vein with multiple stab phlebectomy. He had some moderate discomfort along the course of the great saphenous vein with some bruising proximally. He developed a "knot" in her lower thigh which was tender. 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 She has had no distal edema. She is wearing her stockings and took ibuprofen appropriately  Review of Systems     Objective:   Physical ExamBP 136/78  Pulse 86  Resp 20  Ht 5\' 4"  (1.626 m)  Wt 188 lb (85.276 kg)  BMI 32.27 kg/m2 Left leg has moderate ecchymosis in the inguinal area no hematoma palpable. There is mild tenderness along the course of the left great saphenous vein. There is a thrombosed varix in the distal thigh measuring 2 x 1 cm. No distal edema is noted and stab phlebectomy wounds are healing nicely.  Today I ordered a venous duplex exam of the left leg which are reviewed and interpreted. There is no DVT. Great saphenous vein was totally closed.    Assessment:     Successful laser ablation left great saphenous vein with multiple stab phlebectomy    Plan:     Continue stockings for one more week and then return to see [place] on a when necessary basis

## 2011-07-13 NOTE — Procedures (Unsigned)
DUPLEX DEEP VENOUS EXAM - LOWER EXTREMITY  INDICATION:  One-week follow-up EVLT.  HISTORY:  Edema:  Yes. Trauma/Surgery:  GSV EVLT. Pain:  Yes. PE:  No. Previous DVT:  No. Anticoagulants:  No. Other:  DUPLEX EXAM:               CFV   SFV   PopV  PTV    GSV               R  L  R  L  R  L  R   L  R  L Thrombosis       0     0     0      0     + Spontaneous      +     +     +      0     0 Phasic           +     +     +      0     0 Augmentation     +     +     +      +     0 Compressible     +     +     +      +     0 Competent  Legend:  + - yes  o - no  p - partial  D - decreased  IMPRESSION: 1. Successful ablation of left great saphenous vein flush with the     common femoral vein.  No apparent extension into the deep veins was     observed. 2. At the mid thigh the true greater saphenous vein appears patent     with minimal flow; however, a large superficial branch coursing to     the calf is occluded.      _____________________________ Quita Skye. Hart Rochester, M.D.  LT/MEDQ  D:  06/30/2011  T:  06/30/2011  Job:  [id number]

## 2011-07-31 ENCOUNTER — Encounter: Payer: Self-pay | Admitting: Vascular Surgery

## 2011-09-13 ENCOUNTER — Other Ambulatory Visit: Payer: Self-pay | Admitting: Internal Medicine

## 2011-09-27 ENCOUNTER — Other Ambulatory Visit: Payer: Self-pay | Admitting: Cardiology

## 2011-09-28 NOTE — Telephone Encounter (Signed)
..   Requested Prescriptions   Pending Prescriptions Disp Refills  . metoprolol succinate (TOPROL-XL) 25 MG 24 hr tablet [Pharmacy Med Name: METOPROLOL ER SUCCINATE 25MG  TABS] 180 tablet 3    Sig: TAKE ONE TABLET BY MOUTH TWICE DAILY

## 2011-10-12 ENCOUNTER — Ambulatory Visit: Payer: Self-pay | Admitting: Emergency Medicine

## 2011-12-17 ENCOUNTER — Other Ambulatory Visit: Payer: Self-pay | Admitting: Internal Medicine

## 2012-03-21 ENCOUNTER — Other Ambulatory Visit: Payer: Self-pay | Admitting: Internal Medicine

## 2012-04-25 ENCOUNTER — Other Ambulatory Visit: Payer: Self-pay | Admitting: Obstetrics and Gynecology

## 2012-04-25 DIAGNOSIS — Z803 Family history of malignant neoplasm of breast: Secondary | ICD-10-CM

## 2012-04-25 DIAGNOSIS — Z1231 Encounter for screening mammogram for malignant neoplasm of breast: Secondary | ICD-10-CM

## 2012-05-19 ENCOUNTER — Ambulatory Visit
Admission: RE | Admit: 2012-05-19 | Discharge: 2012-05-19 | Disposition: A | Discharge status: Home / Self Care | Payer: BC Managed Care – PPO | Source: Ambulatory Visit | Attending: Obstetrics and Gynecology | Admitting: Obstetrics and Gynecology

## 2012-05-19 DIAGNOSIS — Z1231 Encounter for screening mammogram for malignant neoplasm of breast: Secondary | ICD-10-CM

## 2012-05-19 DIAGNOSIS — Z803 Family history of malignant neoplasm of breast: Secondary | ICD-10-CM

## 2012-05-26 ENCOUNTER — Encounter: Payer: Self-pay | Admitting: Internal Medicine

## 2012-05-27 ENCOUNTER — Encounter: Payer: Self-pay | Admitting: Internal Medicine

## 2012-05-27 ENCOUNTER — Ambulatory Visit (INDEPENDENT_AMBULATORY_CARE_PROVIDER_SITE_OTHER): Payer: BC Managed Care – PPO | Admitting: Internal Medicine

## 2012-05-27 ENCOUNTER — Ambulatory Visit: Payer: BC Managed Care – PPO | Admitting: Internal Medicine

## 2012-05-27 VITALS — BP 130/84 | HR 64 | Temp 99.1°F | Resp 16 | Wt 190.2 lb

## 2012-05-27 DIAGNOSIS — J069 Acute upper respiratory infection, unspecified: Secondary | ICD-10-CM

## 2012-05-27 DIAGNOSIS — J029 Acute pharyngitis, unspecified: Secondary | ICD-10-CM

## 2012-05-27 MED ORDER — HYDROCODONE-HOMATROPINE 5-1.5 MG/5ML PO SYRP: 5.0000 mL | ORAL_SOLUTION | Freq: Three times a day (TID) | ORAL | Status: DC | PRN

## 2012-05-27 MED ORDER — LEVOFLOXACIN 500 MG PO TABS: 500.0000 mg | ORAL_TABLET | Freq: Every day | ORAL | Status: DC

## 2012-05-27 NOTE — Progress Notes (Signed)
Patient ID: Alexis Bradley, female   DOB: 07/04/1965, 47 y.o.   MRN: [id number]  Patient Active Problem List  Diagnosis  . PREMATURE VENTRICULAR CONTRACTIONS  . GERD  . PALPITATIONS  . FATIGUE  . EDEMA  . SHORTNESS OF BREATH  . AORTIC VALVE DISORDERS  . Obesity (BMI 30-39.9)  . GERD (gastroesophageal reflux disease)  . Gastric polyps  . Bright red rectal bleeding  . Varicose veins of lower extremities with other complications  . Acute URI    Subjective:  CC:   Chief Complaint  Patient presents with  . Cough  . Sore Throat    HPI:   Alexis Bradley a 47 y.o. female who present5 days of sore throat,  sinus congestion, and rhinorrhea causing nocturnal cough .  Her daughter was diagnosed with walking pneumonia and is now treated with azithromycin.  s   Past Medical History  Diagnosis Date  . Palpitations     Holder 2009 with PVCs and bigeminy. Holter 1/12 with PVCs and short runs of atrial tachycardia (up to 9 beats)  . S/P left heart catheterization by percutaneous approach 2009    Apparently done after patient passed out during a treadmill stress test. Luminal irregularities only in coronaries. EF 60%  . Fatigue   . 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 GERD (gastroesophageal reflux disease) Feb 2011    esophagitis by EGD Feb 2011  . Gastric polyps March 2012    Endoscopy  . Hiatal hernia   . Poor circulation of extremity     Bil legs  . Irregular heartbeat   . Varicose veins     Past Surgical History  Procedure Date  . Cardiac catheterization 2009    Left heart cath  . Cholecystectomy   . Vesicovaginal fistula closure w/ tah   . Cesarean section   . Abdominal hysterectomy   . Right gsv elas with 10-20 stab phlebectomies right calf 05-25-11    The following portions of the patient's history  were reviewed and updated as appropriate: Allergies, current medications, and problem list.   Review of Systems:   Patient denies headache, fevers, malaise, unintentional weight loss, skin rash, eye pain, sinus congestion and sinus pain, sore throat, dysphagia,  hemoptysis , cough, dyspnea, wheezing, chest pain, palpitations, orthopnea, edema, abdominal pain, nausea, melena, diarrhea, constipation, flank pain, dysuria, hematuria, urinary  Frequency, nocturia, numbness, tingling, seizures,  Focal weakness, Loss of consciousness,  Tremor, insomnia, depression, anxiety, and suicidal ideation.     History   Social History  . Marital Status: Married    Spouse Name: N/A    Number of Children: N/A  . Years of Education: N/A   Occupational History  . 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 Smoking status: Never Smoker   . Smokeless tobacco: Never Used  . Alcohol Use: 1.5 oz/week    3 drink(s) per week     Comment: Rarely  . Drug Use: No  . Sexually Active: Not on file   Other Topics Concern  . Not on file   Social History Narrative   Married    Objective:  BP 130/84  Pulse 64  Temp 99.1 F (37.3 C) (Oral)  Resp 16  Wt 190 lb 4 oz (86.297  kg)  SpO2 96%  General appearance: alert, cooperative and appears stated age Ears: normal TM's and external ear canals both ears Throat: lips, mucosa, and tongue normal; teeth and gums normal Neck: no adenopathy, no carotid bruit, supple, symmetrical, trachea midline and thyroid not enlarged, symmetric, no tenderness/mass/nodules Back: symmetric, no curvature. ROM normal. No CVA tenderness. Lungs: clear to auscultation bilaterally Heart: regular rate and rhythm, S1, S2 normal, no murmur, click, rub or gallop Abdomen: soft, non-tender; bowel sounds normal; no masses,  no organomegaly Pulses: 2+ and symmetric Skin: Skin color, texture, turgor normal. No rashes  or lesions Lymph nodes: Cervical, supraclavicular, and axillary nodes normal.  Assessment and Plan:  Acute URI Symptoms of  URI are caused by viral infection currently given her current symptoms.   I have explained that in viral URIS, an antibiotic will not help the symptoms and will increase the risk of developing diarrhea. Advised to use oral and nasal decongestants,  Ibuprofen 400 mg and tylenol 650 mq 8 hrs for aches and pains,  tessalon every 8 hours prn cough  Advised to start round of abx only if symptoms worsen to include fevers, facial pain, purulent sputum./drainage.    Updated Medication List Outpatient Encounter Prescriptions as of 05/27/2012  Medication Sig Dispense Refill  . Ascorbic Acid (VITAMIN C) 1000 MG tablet Take 1,000 mg by mouth daily.      Marland Kitchen MAGNESIUM PO Take 1 tablet by mouth daily.        . metoprolol succinate (TOPROL-XL) 25 MG 24 hr tablet TAKE ONE TABLET BY MOUTH TWICE DAILY  180 tablet  3  . Multiple Vitamin (MULTIVITAMIN) tablet Take 1 tablet by mouth daily.      Marland Kitchen NEXIUM 40 MG capsule TAKE ONE CAPSULE BY MOUTH EVERY DAY  30 capsule  4  . HYDROcodone-homatropine (HYCODAN) 5-1.5 MG/5ML syrup Take 5 mLs by mouth every 8 (eight) hours as needed for cough.  120 mL  0  . levofloxacin (LEVAQUIN) 500 MG tablet Take 1 tablet (500 mg total) by mouth daily.  7 tablet  0     Orders Placed This Encounter  Procedures  . POCT rapid strep A    No Follow-up on file.

## 2012-05-27 NOTE — Patient Instructions (Addendum)
You have a viral  Syndrome .  The post nasal drip is causing your sore throat. And your cough  Lavage your sinuses twice daily with Simply saline nasal spray.  Try benadryl 25 mg every 8 hours for the drainage and Sudafed PE 10 to 30 mg  every 8 hours for the sinus congestion and ear pressure.  Continue mucinex and delsym for daytime cough,  Use the hycodan cough syrup (1 or 2 teaspoons) for nighttime cough (it has hydrocodone in it so it will make you drowsy)    If the throat is no better  In 3 to 4 days OR  if you develop T > 100.4,  Green or bloody nasal discharge,  Or facial pain,  Start the levaquin ( antibiotic) .  If you start the levaquin,  Start a probiotic daily (align or floraje, availabel OTC)

## 2012-05-28 ENCOUNTER — Encounter: Payer: Self-pay | Admitting: Internal Medicine

## 2012-05-28 DIAGNOSIS — J069 Acute upper respiratory infection, unspecified: Secondary | ICD-10-CM | POA: Insufficient documentation

## 2012-05-28 NOTE — Assessment & Plan Note (Signed)
Symptoms of  URI are caused by viral infection currently given her current symptoms.   I have explained that in viral URIS, an antibiotic will not help the symptoms and will increase the risk of developing diarrhea. Advised to use oral and nasal decongestants,  Ibuprofen 400 mg and tylenol 650 mq 8 hrs for aches and pains,  tessalon every 8 hours prn cough  Advised to start round of abx only if symptoms worsen to include fevers, facial pain, purulent sputum./drainage.  

## 2012-06-11 ENCOUNTER — Other Ambulatory Visit: Payer: Self-pay

## 2012-08-22 ENCOUNTER — Other Ambulatory Visit: Payer: Self-pay | Admitting: Internal Medicine

## 2012-09-14 ENCOUNTER — Encounter: Payer: Self-pay | Admitting: Adult Health

## 2012-09-14 ENCOUNTER — Ambulatory Visit (INDEPENDENT_AMBULATORY_CARE_PROVIDER_SITE_OTHER): Payer: BC Managed Care – PPO | Admitting: Adult Health

## 2012-09-14 VITALS — BP 110/72 | HR 64 | Temp 98.0°F | Resp 12 | Wt 189.0 lb

## 2012-09-14 DIAGNOSIS — J069 Acute upper respiratory infection, unspecified: Secondary | ICD-10-CM

## 2012-09-14 DIAGNOSIS — R509 Fever, unspecified: Secondary | ICD-10-CM

## 2012-09-14 LAB — POCT INFLUENZA A/B
Influenza A, POC: NEGATIVE
Influenza B, POC: NEGATIVE

## 2012-09-14 MED ORDER — GUAIFENESIN-CODEINE 100-10 MG/5ML PO SYRP: 5.0000 mL | ORAL_SOLUTION | Freq: Three times a day (TID) | ORAL | Status: DC | PRN

## 2012-09-14 MED ORDER — AZITHROMYCIN 250 MG PO TABS: ORAL_TABLET | ORAL | Status: DC

## 2012-09-14 NOTE — Progress Notes (Signed)
  Subjective:    Patient ID: Alexis Bradley, female    DOB: 04-Jul-1965, 47 y.o.   MRN: [id number]  HPI  Patient is a 47 y/o female who presents to clinic with c/o fever, malaise, cough, chest congestion that has been ongoing x 2 days. Symptoms initially began with a sore throat. She thought it might be allergies; however, she then developed a fever and body aches. Fever has been as high as 101. She has been taking Advil with last dose at 7:00 am this morning. She has also taken some antihistamines without improvement. 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   Current Outpatient Prescriptions on File Prior to Visit  Medication Sig Dispense Refill  . Ascorbic Acid (VITAMIN C) 1000 MG tablet Take 1,000 mg by mouth daily.      Marland Kitchen MAGNESIUM PO Take 1 tablet by mouth daily.        . metoprolol succinate (TOPROL-XL) 25 MG 24 hr tablet TAKE ONE TABLET BY MOUTH TWICE DAILY  180 tablet  3  . Multiple Vitamin (MULTIVITAMIN) tablet Take 1 tablet by mouth daily.      Marland Kitchen NEXIUM 40 MG capsule TAKE 1 CAPSULE BY MOUTH EVERY DAY  30 capsule  5   No current facility-administered medications on file prior to visit.    Review of Systems  Constitutional: Positive for fever, chills and appetite change.  HENT: Positive for sore throat. Negative for congestion and sinus pressure.   Respiratory: Positive for cough and shortness of breath. Negative for wheezing.   Gastrointestinal: Negative for nausea, vomiting and diarrhea.  Neurological: Positive for headaches.    BP 110/72  Pulse 64  Temp(Src) 98 F (36.7 C) (Oral)  Resp 12  Wt 189 lb (85.73 kg)  BMI 32.43 kg/m2  SpO2 97%    Objective:   Physical Exam  Constitutional: She is oriented to person, place, and time. She appears well-developed and well-nourished. No distress.  HENT:  Head: Normocephalic and atraumatic.  Mouth/Throat: Oropharynx is clear and moist. No oropharyngeal exudate.   Cardiovascular: Normal rate, regular rhythm and normal heart sounds.  Exam reveals no gallop.   No murmur heard. Pulmonary/Chest: Effort normal and breath sounds normal. No respiratory distress. She has no wheezes. She has no rales.  Lymphadenopathy:    She has no cervical adenopathy.  Neurological: She is alert and oriented to person, place, and time.  Skin: Skin is warm and dry.  Psychiatric: She has a normal mood and affect. Her behavior is normal. Judgment and thought content normal.       Assessment & Plan:

## 2012-09-14 NOTE — Assessment & Plan Note (Signed)
Negative Flu. Start Azithromycin. Also start Robitussin AC. Recommend stay out of work until fever subsides.

## 2012-09-14 NOTE — Patient Instructions (Addendum)
  Please start Azithromycin today.  I have also prescribed Robitussin AC. This can make you sleepy.  Stay home as long as you are running a fever.  Continue to take tylenol or advil for fever and general aches.  Please call if your symptoms are not improved within 3-4 days.

## 2012-09-22 ENCOUNTER — Ambulatory Visit (INDEPENDENT_AMBULATORY_CARE_PROVIDER_SITE_OTHER): Payer: BC Managed Care – PPO | Admitting: Internal Medicine

## 2012-09-22 ENCOUNTER — Encounter: Payer: Self-pay | Admitting: Internal Medicine

## 2012-09-22 VITALS — BP 130/78 | HR 74 | Temp 98.4°F | Resp 16 | Wt 188.5 lb

## 2012-09-22 DIAGNOSIS — B029 Zoster without complications: Secondary | ICD-10-CM | POA: Insufficient documentation

## 2012-09-22 MED ORDER — HYDROCODONE-ACETAMINOPHEN 5-325 MG PO TABS: 1.0000 | ORAL_TABLET | Freq: Four times a day (QID) | ORAL | Status: DC | PRN

## 2012-09-22 MED ORDER — TRAMADOL HCL 50 MG PO TABS: ORAL_TABLET | ORAL | Status: DC

## 2012-09-22 MED ORDER — FAMCICLOVIR 500 MG PO TABS: 500.0000 mg | ORAL_TABLET | Freq: Three times a day (TID) | ORAL | Status: DC

## 2012-09-22 NOTE — Progress Notes (Signed)
Patient ID: Alexis Bradley, female   DOB: Sep 10, 1965, 47 y.o.   MRN: [id number]   Patient Active Problem List   Diagnosis Date Noted  . Herpes zoster 09/22/2012  . Acute URI 05/28/2012  . Varicose veins of lower extremities with other complications 05/11/2011  . Bright red rectal bleeding 04/28/2011  . Gastric polyps   . Obesity (BMI 30-39.9) 01/27/2011  . AORTIC VALVE DISORDERS 06/20/2010  . FATIGUE 05/19/2010  . EDEMA 05/19/2010  . SHORTNESS OF BREATH 05/19/2010  . GERD (gastroesophageal reflux disease) 05/28/2009  . PREMATURE VENTRICULAR CONTRACTIONS 10/02/2008  . GERD 10/02/2008  . PALPITATIONS 10/02/2008    Subjective:  CC:   Chief Complaint  Patient presents with  . Acute Visit    Rash with blistering on right upper leg.    HPI:   Alexis Ruark McCookis a 47 y.o. female who presents with a painful vesicular rash on right thigh.  She was treated last week for a viral syndrome,  And several days later developed burning pain followed by blistering rash on right anterior thigh.  History of chicken pox as a child.    Past Medical History  Diagnosis Date  . Palpitations     Holder 2009 with PVCs and bigeminy. Holter 1/12 with PVCs and short runs of atrial tachycardia (up to 9 beats)  . S/P left heart catheterization by percutaneous approach 2009    Apparently done after patient passed out during a treadmill stress test. Luminal irregularities only in coronaries. EF 60%  . Fatigue   . 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 GERD (gastroesophageal reflux disease) Feb 2011    esophagitis by EGD Feb 2011  . Gastric polyps March 2012    Endoscopy  . Hiatal hernia   . Poor circulation of extremity     Bil legs  . Irregular heartbeat   . Varicose veins     Past Surgical History  Procedure Laterality Date  .  Cardiac catheterization  2009    Left heart cath  . Cholecystectomy    . Vesicovaginal fistula closure w/ tah    . Cesarean section    . Abdominal hysterectomy    . Right gsv elas with 10-20 stab phlebectomies right calf  05-25-11       The following portions of the patient's history were reviewed and updated as appropriate: Allergies, current medications, and problem list.    Review of Systems:   12 Pt  review of systems was negative except those addressed in the HPI,     History   Social History  . Marital Status: Married    Spouse Name: N/A    Number of Children: N/A  . Years of Education: N/A   Occupational History  . 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 Smoking status: Never Smoker   . Smokeless tobacco: Never Used  . Alcohol Use: 1.5 oz/week    3 drink(s) per week     Comment: Rarely  . Drug Use: No  . Sexually Active: Not on file   Other Topics Concern  . Not on file   Social History Narrative   Married    Objective:  BP 130/78  Pulse 74  Temp(Src) 98.4 F (36.9 C) (Oral)  Resp 16  Wt 188 lb 8 oz (85.503 kg)  BMI 32.34 kg/m2  SpO2 99%  General appearance: alert, cooperative and appears stated age Ears: normal TM's and external ear canals both ears Throat: lips, mucosa, and tongue normal; teeth and gums normal Neck: no adenopathy, no carotid bruit, supple, symmetrical, trachea midline and thyroid not enlarged, symmetric, no tenderness/mass/nodules Back: symmetric, no curvature. ROM normal. No CVA tenderness. Lungs: clear to auscultation bilaterally Heart: regular rate and rhythm, S1, S2 normal, no murmur, click, rub or gallop Abdomen: soft, non-tender; bowel sounds normal; no masses,  no organomegaly Pulses: 2+ and symmetric Skin: vesicular erythematous rash anterior right thigh Lymph nodes: Cervical, supraclavicular, and axillary nodes normal.  Assessment  and Plan:  Herpes zoster famcyclovir prescribed.  VICODIN FOR SEVERE PAIN,  Avoid contact with pregnant women and all immunocompromised adults.    Updated Medication List Outpatient Encounter Prescriptions as of 09/22/2012  Medication Sig Dispense Refill  . Ascorbic Acid (VITAMIN C) 1000 MG tablet Take 1,000 mg by mouth daily.      Marland Kitchen MAGNESIUM PO Take 1 tablet by mouth daily.        . metoprolol succinate (TOPROL-XL) 25 MG 24 hr tablet TAKE ONE TABLET BY MOUTH TWICE DAILY  180 tablet  3  . Multiple Vitamin (MULTIVITAMIN) tablet Take 1 tablet by mouth daily.      Marland Kitchen NEXIUM 40 MG capsule TAKE 1 CAPSULE BY MOUTH EVERY DAY  30 capsule  5  . azithromycin (ZITHROMAX) 250 MG tablet Take 2 tablets today and then 1 tablet daily for the next 4 days.  6 tablet  0  . famciclovir (FAMVIR) 500 MG tablet Take 1 tablet (500 mg total) by mouth 3 (three) times daily.  21 tablet  0  . guaiFENesin-codeine (ROBITUSSIN AC) 100-10 MG/5ML syrup Take 5 mLs by mouth 3 (three) times daily as needed for cough.  120 mL  0  . HYDROcodone-acetaminophen (NORCO/VICODIN) 5-325 MG per tablet Take 1 tablet by mouth every 6 (six) hours as needed for pain.  60 tablet  3  . traMADol (ULTRAM) 50 MG tablet 1 or 2 tablets every 6 hours as needed for pain  120 tablet  1   No facility-administered encounter medications on file as of 09/22/2012.     No orders of the defined types were placed in this encounter.    No Follow-up on file.

## 2012-09-22 NOTE — Assessment & Plan Note (Signed)
famcyclovir prescribed.  VICODIN FOR SEVERE PAIN,  Avoid contact with pregnant women and all immunocompromised adults.

## 2012-09-22 NOTE — Patient Instructions (Addendum)
You have herpes zoster (shingles).  It usually occurs after a physiologic stress (illness, surgery,  Or prednisone shot)  Start the famvir right away,  Three times daily , to shorten the duration of inflammation.  Avoid contact with pregnant women and anyone who is not vaccinated for chicken pox    You can try  advil and tylenol combined for pain   Tramadol can be added to this,  Every 6 hours 50 to 100 mg as needed (rx sent to [facility])   vicodin for  severe pain  (and stop the other pain relievers)   Shingles Shingles (herpes zoster) is an infection that is caused by the same virus that causes chickenpox (varicella). The infection causes a painful skin rash and fluid-filled blisters, which eventually break open, crust over, and heal. It may occur in any area of the body, but it usually affects only one side of the body or face. The pain of shingles usually lasts about 1 month. However, some people with shingles may develop long-term (chronic) pain in the affected area of the body. Shingles often occurs many years after the person had chickenpox. It is more common:  In people older than 50 years.  In people with weakened immune systems, such as those with HIV, AIDS, or cancer.  In people taking medicines that weaken the immune system, such as transplant medicines.  In people under great stress. CAUSES  Shingles is caused by the varicella zoster virus (VZV), which also causes chickenpox. After a person is infected with the virus, it can remain in the person's body for years in an inactive state (dormant). To cause shingles, the virus reactivates and breaks out as an infection in a nerve root. The virus can be spread from person to person (contagious) through contact with open blisters of the shingles rash. It will only spread to people who have not had chickenpox. When these people are exposed to the virus, they may develop chickenpox. They will not develop shingles. Once the blisters  scab over, the person is no longer contagious and cannot spread the virus to others. SYMPTOMS  Shingles shows up in stages. The initial symptoms may be pain, itching, and tingling in an area of the skin. This pain is usually described as burning, stabbing, or throbbing.In a few days or weeks, a painful red rash will appear in the area where the pain, itching, and tingling were felt. The rash is usually on one side of the body in a band or belt-like pattern. Then, the rash usually turns into fluid-filled blisters. They will scab over and dry up in approximately 2 3 weeks. Flu-like symptoms may also occur with the initial symptoms, the rash, or the blisters. These may include:  Fever.  Chills.  Headache.  Upset stomach. DIAGNOSIS  Your caregiver will perform a skin exam to diagnose shingles. Skin scrapings or fluid samples may also be taken from the blisters. This sample will be examined under a microscope or sent to a lab for further testing. TREATMENT  There is no specific cure for shingles. Your caregiver will likely prescribe medicines to help you manage the pain, recover faster, and avoid long-term problems. This may include antiviral drugs, anti-inflammatory drugs, and pain medicines. HOME CARE INSTRUCTIONS   Take a cool bath or apply cool compresses to the area of the rash or blisters as directed. This may help with the pain and itching.   Only take over-the-counter or prescription medicines as directed by your caregiver.   Rest  as directed by your caregiver.  Keep your rash and blisters clean with mild soap and cool water or as directed by your caregiver.  Do not pick your blisters or scratch your rash. Apply an anti-itch cream or numbing creams to the affected area as directed by your caregiver.  Keep your shingles rash covered with a loose bandage (dressing).  Avoid skin contact with:  Babies.   Pregnant women.   Children with eczema.   Elderly people with  transplants.   People with chronic illnesses, such as leukemia or AIDS.   Wear loose-fitting clothing to help ease the pain of material rubbing against the rash.  Keep all follow-up appointments with your caregiver.If the area involved is on your face, you may receive a referral for follow-up to a specialist, such as an eye doctor (ophthalmologist) or an ear, nose, and throat (ENT) doctor. Keeping all follow-up appointments will help you avoid eye complications, chronic pain, or disability.  SEEK IMMEDIATE MEDICAL CARE IF:   You have facial pain, pain around the eye area, or loss of feeling on one side of your face.  You have ear pain or ringing in your ear.  You have loss of taste.  Your pain is not relieved with prescribed medicines.   Your redness or swelling spreads.   You have more pain and swelling.  Your condition is worsening or has changed.   You have a feveror persistent symptoms for more than 2 3 days.  You have a fever and your symptoms suddenly get worse. MAKE SURE YOU:  Understand these instructions.  Will watch your condition.  Will get help right away if you are not doing well or get worse. Document Released: 04/13/2005 Document Revised: 01/06/2012 Document Reviewed: 11/26/2011 [facility] Patient Information 2014 [place], [place]. Shingles Shingles (herpes zoster) is an infection that is caused by the same virus that causes chickenpox (varicella). The infection causes a painful skin rash and fluid-filled blisters, which eventually break open, crust over, and heal. It may occur in any area of the body, but it usually affects only one side of the body or face. The pain of shingles usually lasts about 1 month. However, some people with shingles may develop long-term (chronic) pain in the affected area of the body. Shingles often occurs many years after the person had chickenpox. It is more common:  In people older than 50 years.  In people with weakened  immune systems, such as those with HIV, AIDS, or cancer.  In people taking medicines that weaken the immune system, such as transplant medicines.  In people under great stress. CAUSES  Shingles is caused by the varicella zoster virus (VZV), which also causes chickenpox. After a person is infected with the virus, it can remain in the person's body for years in an inactive state (dormant). To cause shingles, the virus reactivates and breaks out as an infection in a nerve root. The virus can be spread from person to person (contagious) through contact with open blisters of the shingles rash. It will only spread to people who have not had chickenpox. When these people are exposed to the virus, they may develop chickenpox. They will not develop shingles. Once the blisters scab over, the person is no longer contagious and cannot spread the virus to others. SYMPTOMS  Shingles shows up in stages. The initial symptoms may be pain, itching, and tingling in an area of the skin. This pain is usually described as burning, stabbing, or throbbing.In a few days or  weeks, a painful red rash will appear in the area where the pain, itching, and tingling were felt. The rash is usually on one side of the body in a band or belt-like pattern. Then, the rash usually turns into fluid-filled blisters. They will scab over and dry up in approximately 2 3 weeks. Flu-like symptoms may also occur with the initial symptoms, the rash, or the blisters. These may include:  Fever.  Chills.  Headache.  Upset stomach. DIAGNOSIS  Your caregiver will perform a skin exam to diagnose shingles. Skin scrapings or fluid samples may also be taken from the blisters. This sample will be examined under a microscope or sent to a lab for further testing. TREATMENT  There is no specific cure for shingles. Your caregiver will likely prescribe medicines to help you manage the pain, recover faster, and avoid long-term problems. This may include  antiviral drugs, anti-inflammatory drugs, and pain medicines. HOME CARE INSTRUCTIONS   Take a cool bath or apply cool compresses to the area of the rash or blisters as directed. This may help with the pain and itching.   Only take over-the-counter or prescription medicines as directed by your caregiver.   Rest as directed by your caregiver.  Keep your rash and blisters clean with mild soap and cool water or as directed by your caregiver.  Do not pick your blisters or scratch your rash. Apply an anti-itch cream or numbing creams to the affected area as directed by your caregiver.  Keep your shingles rash covered with a loose bandage (dressing).  Avoid skin contact with:  Babies.   Pregnant women.   Children with eczema.   Elderly people with transplants.   People with chronic illnesses, such as leukemia or AIDS.   Wear loose-fitting clothing to help ease the pain of material rubbing against the rash.  Keep all follow-up appointments with your caregiver.If the area involved is on your face, you may receive a referral for follow-up to a specialist, such as an eye doctor (ophthalmologist) or an ear, nose, and throat (ENT) doctor. Keeping all follow-up appointments will help you avoid eye complications, chronic pain, or disability.  SEEK IMMEDIATE MEDICAL CARE IF:   You have facial pain, pain around the eye area, or loss of feeling on one side of your face.  You have ear pain or ringing in your ear.  You have loss of taste.  Your pain is not relieved with prescribed medicines.   Your redness or swelling spreads.   You have more pain and swelling.  Your condition is worsening or has changed.   You have a feveror persistent symptoms for more than 2 3 days.  You have a fever and your symptoms suddenly get worse. MAKE SURE YOU:  Understand these instructions.  Will watch your condition.  Will get help right away if you are not doing well or get  worse. Document Released: 04/13/2005 Document Revised: 01/06/2012 Document Reviewed: 11/26/2011 [facility] Patient Information 2014 [place], [place]. Postherpetic Neuralgia Shingles is a painful disease. It is caused by the herpes zoster virus. This is the same virus which also causes chickenpox. It can affect the torso, limbs, or the face. For most people, shingles is a condition of rather sudden onset. Pain usually lasts about 1 month. In older patients, or patients with poor immune systems, a painful, long-standing (chronic) condition called postherpetic neuralgia can develop. This condition rarely happens before age 9. But at least 50% of people over 50 become affected following an attack  of shingles. There is a natural tendency for this condition to improve over time with no treatment. Less than 5% of patients have pain that lasts for more than 1 year. DIAGNOSIS  Herpes is usually easily diagnosed on physical exam. Pain sometimes follows when the skin sores (lesions) have disappeared. It is called postherpetic neuralgia. That name simply means the pain that follows herpes. TREATMENT   Treating this condition may be difficult. Usually one of the tricyclic antidepressants, often amitriptyline, is the first line of treatment. There is evidence that the sooner these medications are given, the more likely they are to reduce pain.  Conventional analgesics, regional nerve blocks, and anticonvulsants have little benefit in most cases when used alone. Other tricyclic anti-depressants are used as a second option if the first antidepressant is unsuccessful.  Anticonvulsants, including carbamazepine, have been found to provide some added benefit when used with a tricyclic anti-depressant. This is especially for the stabbing type of pain similar to that of trigeminal neuralgia.  Chronic opioid therapy. This is a strong narcotic pain medication. It is used to treat pain that is resistant to other measures. The  issues of dependency and tolerance can be reduced with closely managed care.  Some cream treatments are applied locally to the affected area. They can help when used with other treatments. Their use may be difficult in the case of postherpetic trigeminal neuralgia. This is involved with the face. So the substances can irritate the eye and the skin around the eye. Examples of creams used include Capsaicin and lidocaine creams.  For shingles, antiviral therapies along with analgesics are recommended. Studies of the effect of anti-viral agents such as acyclovir on shingles have been done. They show improved rates of healing and decreased severity of sudden (acute) pain. Some observations suggest that nerve blocks during shingles infection will:  Reduce pain.  Shorten the acute episode.  Prevent the emergence of postherpetic neuralgia. Viral medications used include Acyclovir (Zovirax), Valacyclovir, Famciclovir and a lysine diet. Document Released: 07/04/2002 Document Revised: 07/06/2011 Document Reviewed: 04/13/2005 [facility] Patient Information 2014 [place], [place].

## 2012-10-10 ENCOUNTER — Other Ambulatory Visit: Payer: Self-pay | Admitting: *Deleted

## 2012-10-12 ENCOUNTER — Other Ambulatory Visit: Payer: Self-pay | Admitting: Cardiology

## 2012-10-12 ENCOUNTER — Encounter: Payer: Self-pay | Admitting: Internal Medicine

## 2012-10-12 MED ORDER — METOPROLOL SUCCINATE ER 25 MG PO TB24: ORAL_TABLET | ORAL | Status: DC

## 2013-03-02 ENCOUNTER — Other Ambulatory Visit: Payer: Self-pay

## 2013-03-08 ENCOUNTER — Other Ambulatory Visit: Payer: Self-pay | Admitting: Internal Medicine

## 2013-03-09 NOTE — Telephone Encounter (Signed)
Eprescribed.

## 2013-03-31 ENCOUNTER — Ambulatory Visit (INDEPENDENT_AMBULATORY_CARE_PROVIDER_SITE_OTHER): Payer: BC Managed Care – PPO | Admitting: Nurse Practitioner

## 2013-03-31 ENCOUNTER — Encounter: Payer: Self-pay | Admitting: Nurse Practitioner

## 2013-03-31 ENCOUNTER — Encounter: Payer: Self-pay | Admitting: Radiology

## 2013-03-31 ENCOUNTER — Telehealth: Payer: Self-pay | Admitting: Internal Medicine

## 2013-03-31 ENCOUNTER — Telehealth: Payer: Self-pay | Admitting: Cardiology

## 2013-03-31 ENCOUNTER — Encounter (INDEPENDENT_AMBULATORY_CARE_PROVIDER_SITE_OTHER): Payer: BC Managed Care – PPO

## 2013-03-31 VITALS — BP 110/78 | HR 48 | Ht 64.0 in | Wt 166.4 lb

## 2013-03-31 DIAGNOSIS — I498 Other specified cardiac arrhythmias: Secondary | ICD-10-CM

## 2013-03-31 DIAGNOSIS — I359 Nonrheumatic aortic valve disorder, unspecified: Secondary | ICD-10-CM

## 2013-03-31 DIAGNOSIS — R002 Palpitations: Secondary | ICD-10-CM

## 2013-03-31 DIAGNOSIS — R001 Bradycardia, unspecified: Secondary | ICD-10-CM

## 2013-03-31 LAB — CBC WITH DIFFERENTIAL/PLATELET
Basophils Absolute: 0 10*3/uL (ref 0.0–0.1)
Basophils Relative: 0.4 % (ref 0.0–3.0)
Eosinophils Absolute: 0.1 10*3/uL (ref 0.0–0.7)
Eosinophils Relative: 1.6 % (ref 0.0–5.0)
HCT: 39.7 % (ref 36.0–46.0)
Hemoglobin: 13.3 g/dL (ref 12.0–15.0)
Lymphocytes Relative: 35 % (ref 12.0–46.0)
Lymphs Abs: 2.8 10*3/uL (ref 0.7–4.0)
MCHC: 33.5 g/dL (ref 30.0–36.0)
MCV: 91.8 fl (ref 78.0–100.0)
Monocytes Absolute: 0.6 10*3/uL (ref 0.1–1.0)
Monocytes Relative: 7.2 % (ref 3.0–12.0)
Neutro Abs: 4.4 10*3/uL (ref 1.4–7.7)
Neutrophils Relative %: 55.8 % (ref 43.0–77.0)
Platelets: 246 10*3/uL (ref 150.0–400.0)
RBC: 4.32 Mil/uL (ref 3.87–5.11)
RDW: 13.6 % (ref 11.5–14.6)
WBC: 7.9 10*3/uL (ref 4.5–10.5)

## 2013-03-31 LAB — BASIC METABOLIC PANEL
BUN: 10 mg/dL (ref 6–23)
CO2: 31 mEq/L (ref 19–32)
Calcium: 9.3 mg/dL (ref 8.4–10.5)
Chloride: 100 mEq/L (ref 96–112)
Creatinine, Ser: 0.6 mg/dL (ref 0.4–1.2)
GFR: 108.5 mL/min (ref >60.00)
Glucose, Bld: 90 mg/dL (ref 70–99)
Potassium: 3.5 mEq/L (ref 3.5–5.1)
Sodium: 137 mEq/L (ref 135–145)

## 2013-03-31 LAB — TSH: TSH: 3.11 u[IU]/mL (ref 0.35–5.50)

## 2013-03-31 NOTE — Telephone Encounter (Signed)
Advised patient not to drive to appt today.

## 2013-03-31 NOTE — Telephone Encounter (Signed)
Pt states she was transferred to triage, states she was on hold for a while and hung up.  States her cardiologist todl her they can get her an appt in [place] today if needed.  Pt states last night pt was dizzy, almost passed out.  BP was low and HR was around 40.  Feels better today.  HR still in low 50s.  Pt did not want to be transferred back to triage nurse.  Advised pt if she can get in with cardiologist to do so.  Advised msg would be sent to Dr. Darrick Huntsman as well.  Please contact pt.

## 2013-03-31 NOTE — Patient Instructions (Signed)
We are going to place an event monitor   We will check labs today  Cut your metoprolol back to just 1/2 (12.5mg ) at night  See Dr. Shirlee Latch in 5 weeks  Call the [facility] office at [phone] if you have any questions, problems or concerns.

## 2013-03-31 NOTE — Telephone Encounter (Signed)
Pt states last night she was walking last night, not very fast and felt dizzy. She checked her pulse and it was in the 40s. She did not take her metoprolol last night. She is feeling better today, pulse still low in the 50s this morning.

## 2013-03-31 NOTE — Progress Notes (Signed)
Patient ID: Alexis Bradley, female   DOB: 1966-01-04, 47 y.o.   MRN: [id number] E cardio 30 day monitor applied

## 2013-03-31 NOTE — Telephone Encounter (Signed)
Pt states she was sick last week. I have scheduled pt appt today at 2:30PM with Sunday Spillers.

## 2013-03-31 NOTE — Telephone Encounter (Signed)
New message    Last night pulse fell to 40 while walking and she almost passed out.  This am, pulse is 50.  She is on metoprolol.  Could this be her medication or will she need to be seen?

## 2013-03-31 NOTE — Progress Notes (Signed)
Alexis Bradley Date of Birth: Oct 14, 1965 Medical Record #[id number]  History of Present Illness: Alexis Bradley is seen back today for a work in visit. Seen for Dr. Shirlee Latch. She is a 47 year old female with a history of fatigue, palpitations with short runs of atrial tach and PVCs. Has had prior cath at [facility] by Dr. Kirke Corin after passing out during her treadmill test in 2009 - minimal luminal irregularities and normal EF. Has been treated with beta blocker. Last Holter in 2012. Echo with normal EF and mild to moderate aortic insufficiency and mild MR.   Last seen here in February of 2013 and was doing well.  Comes in today. Here with her husband. She has not been been here since 2013. Has basically done ok. Still with palpitations. Husband seems to think she has had more than what she admits to. Has really embraced exercise and diet. Has lost over 30 pounds. Exercising daily. Calories restricted to 1250 but some days down to 500 calories - she did 500 calories this past Tuesday. Last night she was out walking - got very dizzy and lightheaded. Felt like she was in a fog. Thought she was going to pass out but did not. Got back home and laid down. BP was at her norm ~ 100 but HR was 41. She laid around all night. Did not take her metoprolol last night but did take it this morning. She feels extremely tired. Had lots of palpitations last night as well - more so that usual. She does not restrict her caffeine. Likes 4 cups of coffee and drinks several diet Coke's. Does drink alcohol. Did have ?pneumonia last week - treated with antibiotics and inhaler. Still fatigued today. HR remains in the 40's.   Current Outpatient Prescriptions  Medication Sig Dispense Refill  . Ascorbic Acid (VITAMIN C) 1000 MG tablet Take 1,000 mg by mouth daily.      Marland Kitchen MAGNESIUM PO Take 1 tablet by mouth daily.        . metoprolol succinate (TOPROL-XL) 25 MG 24 hr tablet TAKE ONE TABLET BY MOUTH TWICE DAILY  180 tablet  3  . Multiple  Vitamin (MULTIVITAMIN) tablet Take 1 tablet by mouth daily.      Marland Kitchen NEXIUM 40 MG capsule TAKE ONE CAPSULE BY MOUTH EVERY DAY  30 capsule  1   No current facility-administered medications for this visit.    Allergies  Allergen Reactions  . Tape Hives and Other (See Comments)    redness    Past Medical History  Diagnosis Date  . Palpitations     Holder 2009 with PVCs and bigeminy. Holter 1/12 with PVCs and short runs of atrial tachycardia (up to 9 beats)  . S/P left heart catheterization by percutaneous approach 2009    Apparently done after patient passed out during a treadmill stress test. Luminal irregularities only in coronaries. EF 60%  . Fatigue   . 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 GERD (gastroesophageal reflux disease) Feb 2011    esophagitis by EGD Feb 2011  . Gastric polyps March 2012    Endoscopy  . Hiatal hernia   . Poor circulation of extremity     Bil legs  . Irregular heartbeat   . Varicose veins     Past Surgical History  Procedure Laterality Date  . Cardiac catheterization  2009    Left heart cath  . Cholecystectomy    . Vesicovaginal fistula closure w/ tah    . Cesarean section    . Abdominal hysterectomy    . 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 Never Smoker   Smokeless tobacco  . Never Used    History  Alcohol Use  . 1.5 oz/week  . 3 drink(s) per week    Comment: Rarely    Family History  Problem Relation Age of Onset  . Coronary artery disease Neg Hx     Premature  . Stroke Other     Grandfather  . Cancer Sister     breast  . Breast cancer Sister   . Breast cancer Maternal Grandmother   . Breast cancer Paternal Grandmother   . Heart disease Paternal Grandfather     Review of Systems: The review of  systems is per the HPI.  All other systems were reviewed and are negative.  Physical Exam: BP 110/78  Pulse 48  Ht 5\' 4"  (1.626 m)  Wt 166 lb 6.4 oz (75.479 kg)  BMI 28.55 kg/m2 Patient is very pleasant and in no acute distress. Skin is warm and dry. Color is normal.  HEENT is unremarkable. Normocephalic/atraumatic. PERRL. Sclera are nonicteric. Neck is supple. No masses. No JVD. Lungs are clear. Cardiac exam shows a regular rate and rhythm. Rate is slow. Abdomen is soft. Extremities are without edema. Gait and ROM are intact. No gross neurologic deficits noted.  Wt Readings from Last 3 Encounters:  03/31/13 166 lb 6.4 oz (75.479 kg)  09/22/12 188 lb 8 oz (85.503 kg)  09/14/12 189 lb (85.73 kg)     LABORATORY DATA: EKG with sinus brady - rate of 48  Lab Results  Component Value Date   WBC 7.2 05/19/2010   HGB 13.8 05/19/2010   HCT 40.1 05/19/2010   PLT 248 05/19/2010   GLUCOSE 106* 04/27/2011   CHOL 190 04/27/2011   TRIG 103.0 04/27/2011   HDL 59.50 04/27/2011   LDLCALC 110* 04/27/2011   ALT 16 04/27/2011   AST 22 04/27/2011   NA 138 04/27/2011   K 4.0 04/27/2011   CL 107 04/27/2011   CREATININE 0.7 04/27/2011   BUN 13 04/27/2011   CO2 25 04/27/2011   TSH 3.682 05/19/2010   HGBA1C 5.9 04/27/2011     Assessment / Plan: 1. Symptomatic bradycardia - I have cut her metoprolol back to just 12.5 mg at night. May have to stop altogether.  2. Palpitations - past history of tachycardia - now with bradycardia - still with palpitations - will place event monitor for 30 days. I do not think a 24 hour holter will be sufficient to give [place] the info needed, especially if we end up stopping her medicines and see what her rhythm does. Her palpitations are not every day as well. Will check baseline labs today.  Will get her back to see Dr. Shirlee Latch in 5 weeks. May need to consider EP referral. Really needs to restrict caffeine and alcohol.   Patient is agreeable to this plan and will call if  any problems develop in the interim.   Rosalio Macadamia, RN, ANP-C [facility] [address] [place], [place]  [address]

## 2013-03-31 NOTE — Telephone Encounter (Signed)
Returned call to patient and she has an appointment with cardiologist at 2.30 [place] office in [place]. FYI

## 2013-04-03 ENCOUNTER — Telehealth: Payer: Self-pay | Admitting: Cardiology

## 2013-04-03 NOTE — Telephone Encounter (Signed)
Follow up        Pt returning our call for lab results.  Please call pt back.

## 2013-05-09 ENCOUNTER — Ambulatory Visit: Payer: BC Managed Care – PPO | Admitting: Cardiology

## 2013-05-11 ENCOUNTER — Telehealth: Payer: Self-pay | Admitting: *Deleted

## 2013-05-11 ENCOUNTER — Other Ambulatory Visit: Payer: Self-pay

## 2013-05-11 DIAGNOSIS — Z1231 Encounter for screening mammogram for malignant neoplasm of breast: Secondary | ICD-10-CM

## 2013-05-11 NOTE — Telephone Encounter (Signed)
Follow up  ° ° ° °Returning call back to nurse  °

## 2013-05-11 NOTE — Telephone Encounter (Signed)
Discussed results with patient. Pt states palpitations not too bad, tiredness main complaint,  she will not make any changes at present and discuss further with Dr Aundra Dubin at time of appt 06/01/13. Pt has completely avoided caffeine.

## 2013-05-11 NOTE — Telephone Encounter (Signed)
Dr Aundra Dubin reviewed monitor done 03/31/13-04/29/13  Short atrial runs Mild bradycardia(upper 40s) Cut back on caffeine If she can tolerate mild palpitations from short SVT runs, no further treatment necessary. Hold off on increasing BB with low HR.  LMTCB for pt.

## 2013-05-15 ENCOUNTER — Encounter: Payer: Self-pay | Admitting: Internal Medicine

## 2013-05-15 LAB — HM MAMMOGRAPHY: HM Mammogram: NORMAL

## 2013-05-30 ENCOUNTER — Ambulatory Visit
Admission: RE | Admit: 2013-05-30 | Discharge: 2013-05-30 | Disposition: A | Discharge status: Home / Self Care | Payer: BC Managed Care – PPO | Source: Ambulatory Visit

## 2013-05-30 DIAGNOSIS — Z1231 Encounter for screening mammogram for malignant neoplasm of breast: Secondary | ICD-10-CM

## 2013-06-01 ENCOUNTER — Encounter: Payer: Self-pay | Admitting: Cardiology

## 2013-06-01 ENCOUNTER — Ambulatory Visit (INDEPENDENT_AMBULATORY_CARE_PROVIDER_SITE_OTHER): Payer: BC Managed Care – PPO | Admitting: Cardiology

## 2013-06-01 VITALS — BP 140/76 | HR 52 | Ht 64.0 in | Wt 154.0 lb

## 2013-06-01 DIAGNOSIS — I498 Other specified cardiac arrhythmias: Secondary | ICD-10-CM

## 2013-06-01 DIAGNOSIS — R001 Bradycardia, unspecified: Secondary | ICD-10-CM

## 2013-06-01 DIAGNOSIS — R002 Palpitations: Secondary | ICD-10-CM

## 2013-06-01 NOTE — Patient Instructions (Signed)
Stop Toprol.  Your physician wants you to follow-up in: 6 months with Dr Aundra Dubin. (August 2015). You will receive a reminder letter in the mail two months in advance. If you don't receive a letter, please call our office to schedule the follow-up appointment.

## 2013-06-02 DIAGNOSIS — R001 Bradycardia, unspecified: Secondary | ICD-10-CM | POA: Insufficient documentation

## 2013-06-02 NOTE — Progress Notes (Signed)
Patient ID: Alexis Bradley, female   DOB: [birth year], 48 y.o.   MRN: [id number] PCP: Dr. Derrel Nip  48 yo with history of fatigue, short runs of atrial tachycardia, and  PVCs returns for cardiology followup. Cardiac catheterization in 2009 showed minimal luminal irregularities and normal EF.  Patient was seen again in 12/11 for palpitations. I had her do a holter monitor in 1/12, which showed occasional PVCs and short runs of atrial tachycardia (up to 9 beats). Echo at that time showed normal EF with mild to moderate aortic insufficiency and mild mitral regurgitation.  I put her on Toprol XL after that.    She did well for a couple of years, then started having episodes of dizziness.  She would take her pulse with these episodes and find it to be in the 40s.  The worse episode of dizziness correlated to a HR in the high 30s.  She saw Truitt Merle in 12/14 and had holter monitor done.  This showed short runs of probable atrial tachycardia along with mild sinus bradycardia (HR in the upper 40s).  She cut back Toprol XL to 12.5 mg daily.  She has cut out caffeine.  She continues to walk 3-4 miles/day with no exertional dyspnea or chest pain.   Labs (8/09): BNP 125  Labs (1/12): BNP 50, TSH normal, HCT 40.1   \Allergies (verified):  No Known Drug Allergies   Past History:  Past Medical History:  1. PALPITATIONS: Holter 2009 with PVCs and bigeminy. Holter 1/12 with PVCs and short runs of atrial tachycardia (up to 9 beats).  Holter (12/14) with short runs of likely atrial tachycardia and mild bradycardia (HR at times in the upper 40s).   2. GERD  3. Left heart cath (2009): Apparently done after patient passed out during a treadmill stress test. Luminal irregularities only in coronaries. EF 60%.  4. Fatigue  5. 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  Repeat echo (2/13)  with EF 55-65%, mild AI, mild MR.  6. Venous insufficiency  Family History: CVA (grandfather). No premature CAD.   Social History:  The patient is a nonsmoker. She rarely drinks alcohol.  She is married. 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 No illicit drugs.   ROS: All systems reviewed and negative except as per HPI.   Current Outpatient Prescriptions  Medication Sig Dispense Refill  . Ascorbic Acid (VITAMIN C) 1000 MG tablet Take 1,000 mg by mouth daily.      Marland Kitchen MAGNESIUM PO Take 1 tablet by mouth daily.        . Multiple Vitamin (MULTIVITAMIN) tablet Take 1 tablet by mouth daily.      Marland Kitchen NEXIUM 40 MG capsule TAKE ONE CAPSULE BY MOUTH EVERY DAY  30 capsule  1   No current facility-administered medications for this visit.    BP 140/76  Pulse 52  Ht 5\' 4"  (1.626 m)  Wt 69.854 kg (154 lb)  BMI 26.42 kg/m2 General: NAD Neck: No JVD, no thyromegaly or thyroid nodule.  Lungs: Clear to auscultation bilaterally with normal respiratory effort. No edema.  No carotid bruit.  Normal pedal pulses.  Abdomen: Soft, nontender, no hepatosplenomegaly, no distention.  Neurologic: Alert and oriented x 3.  Psych: Normal affect. Extremities: No clubbing or cyanosis.   Assessment/Plan: 1. Bradycardia: Mild sinus bradycardia with HR in the  upper 40s occasionally.  She occasionally feels lightheaded when HR is low but has not passed out.  I am going to have her stop metoprolol altogether.  2. Atrial tachycardia: Patient has had documented short runs of atrial tachycardia.  She has not felt palpitations as often since cutting out caffeine.  I recommended that she stay off caffeine and make sure to get adequate sleep and exercise.  As above, I am going to try having her stop Toprol XL.  Hopefully this will not result in worsening of palpitations.   Loralie Champagne 06/02/2013

## 2013-06-29 ENCOUNTER — Telehealth: Payer: Self-pay | Admitting: Cardiology

## 2013-06-29 NOTE — Telephone Encounter (Signed)
Pt calls today b/c over the past 2 days she has been having a lot of palpitations. Yesterday she felt dizzy. Today she does not. Today states she feels kind of tired & still having palpitations.  States she has not taken any of her Toprol since early February.  Advised that she could take the 12.5 mg Toprol today as she still has this medication to help with the palpitations.  She was wondering should she restart taking it everyday.  Will forward this message to Dr. Aundra Dubin for review.

## 2013-06-29 NOTE — Telephone Encounter (Signed)
lmovm Debbie Rey Fors RN  

## 2013-06-29 NOTE — Telephone Encounter (Signed)
Take the toprol XL daily and see how she is doing.  If no better, needs to be seen in office.

## 2013-06-29 NOTE — Telephone Encounter (Signed)
New message    C/o fast heart beat and dizzy.  Patient is off her metoprolol.  Please advise

## 2013-06-30 NOTE — Telephone Encounter (Signed)
Pt advised,verbalized understanding, will let us know if this doesn't help.

## 2013-06-30 NOTE — Telephone Encounter (Signed)
LMTCB

## 2013-07-24 ENCOUNTER — Telehealth: Payer: Self-pay | Admitting: Cardiology

## 2013-07-24 NOTE — Telephone Encounter (Signed)
Unfortunately, all medications that help palpitations also slow the HR.  She can try to use metoprolol as needed if palpitations are especially bothersome.

## 2013-07-24 NOTE — Telephone Encounter (Signed)
New problem   Pt is having decreased heart rate and need to speak to nursing concerning this. Please call pt

## 2013-07-24 NOTE — Telephone Encounter (Signed)
I spoke with pt & she is aware. She will take Toprol as needed for palpitations  Horton Chin RN

## 2013-07-24 NOTE — Telephone Encounter (Signed)
Pt calls today b/c yesterday                          heart rate was 46 & bp 110/54.  She felt bad. She did not take the 12.5mg  Toprol at bedtime  Today she states she is feeling much better.     Heart rate  60    bp 120/60s  She would like to know what other medication she can take for her palpitations that would not drop her heart rate?  Forwarded to Dr. Alva Garnet RN

## 2013-08-16 ENCOUNTER — Encounter: Payer: BC Managed Care – PPO | Admitting: Internal Medicine

## 2013-09-04 LAB — HM PAP SMEAR

## 2013-09-08 ENCOUNTER — Ambulatory Visit (INDEPENDENT_AMBULATORY_CARE_PROVIDER_SITE_OTHER): Payer: BC Managed Care – PPO | Admitting: Internal Medicine

## 2013-09-08 ENCOUNTER — Encounter: Payer: Self-pay | Admitting: Internal Medicine

## 2013-09-08 VITALS — BP 104/68 | HR 60 | Temp 97.9°F | Resp 16 | Ht 64.25 in | Wt 142.8 lb

## 2013-09-08 DIAGNOSIS — I359 Nonrheumatic aortic valve disorder, unspecified: Secondary | ICD-10-CM

## 2013-09-08 DIAGNOSIS — R634 Abnormal weight loss: Secondary | ICD-10-CM

## 2013-09-08 DIAGNOSIS — R739 Hyperglycemia, unspecified: Secondary | ICD-10-CM

## 2013-09-08 DIAGNOSIS — I83893 Varicose veins of bilateral lower extremities with other complications: Secondary | ICD-10-CM

## 2013-09-08 DIAGNOSIS — Z0000 Encounter for general adult medical examination without abnormal findings: Secondary | ICD-10-CM

## 2013-09-08 DIAGNOSIS — R7309 Other abnormal glucose: Secondary | ICD-10-CM

## 2013-09-08 DIAGNOSIS — I4949 Other premature depolarization: Secondary | ICD-10-CM

## 2013-09-08 LAB — COMPREHENSIVE METABOLIC PANEL
ALT: 16 U/L (ref 0–35)
AST: 23 U/L (ref 0–37)
Albumin: 4.4 g/dL (ref 3.5–5.2)
Alkaline Phosphatase: 51 U/L (ref 39–117)
BUN: 12 mg/dL (ref 6–23)
CO2: 28 meq/L (ref 19–32)
Calcium: 9.4 mg/dL (ref 8.4–10.5)
Chloride: 102 mEq/L (ref 96–112)
Creatinine, Ser: 0.6 mg/dL (ref 0.4–1.2)
GFR: 112.48 mL/min (ref >60.00)
Glucose, Bld: 74 mg/dL (ref 70–99)
Potassium: 4.1 mEq/L (ref 3.5–5.1)
SODIUM: 139 meq/L (ref 135–145)
TOTAL PROTEIN: 6.9 g/dL (ref 6.0–8.3)
Total Bilirubin: 0.9 mg/dL (ref 0.2–1.2)

## 2013-09-08 LAB — HEMOGLOBIN A1C: Hgb A1c MFr Bld: 5.6 % (ref 4.6–6.5)

## 2013-09-08 MED ORDER — DILTIAZEM HCL 30 MG PO TABS: 30.0000 mg | ORAL_TABLET | Freq: Four times a day (QID) | ORAL | Status: DC

## 2013-09-08 NOTE — Patient Instructions (Addendum)
[email or web address]  For a variety of compression socks/stockings   Trial of low dose cardizem instead of toprol to control heart rate.  Start with one tablet daily .  May take up to 4 times daily if necessary  Check with HD about tetanus check with insurance about Zostavax (shingles shot)   You need 25 grams of fiber daily for healthy bowel s    a1c today,  No meds unless > 6.5   This is  One version of a  "Low GI"  Diet:  It will still lower your blood sugars and allow you to lose 4 to 8  lbs  per month if you follow it carefully.  Your goal with exercise is a minimum of 30 minutes of aerobic exercise 5 days per week (Walking does not count once it becomes easy!)    All of the foods can be found at grocery stores and in bulk at [employer].  The Atkins protein bars and shakes are available in more varieties at [employer], [employer] and [place].     7 AM Breakfast:  Choose from the following:  Low carbohydrate Protein  Shakes (I recommend the EAS [employer]vantEdge "Carb Control" shakes  Or the low carb shakes by Atkins.    2.5 carbs   Arnold's "Sandwhich Thin"toasted  w/ peanut butter (no jelly: about 20 net carbs  "Bagel Thin" with cream cheese and salmon: about 20 carbs   a scrambled egg/bacon/cheese burrito made with Mission's "carb balance" whole wheat tortilla  (about 10 net carbs )   Avoid cereal and bananas, oatmeal and cream of wheat and grits. They are loaded with carbohydrates!   10 AM: high protein snack  Protein bar by Atkins (the snack size, under 200 cal, usually < 6 net carbs).    A stick of cheese:  Around 1 carb,  100 cal     Dannon Light n Fit [place] Yogurt  (80 cal, 8 carbs)  Other so called "protein bars" and Greek yogurts tend to be loaded with carbohydrates.  Remember, in food advertising, the word "energy" is synonymous for " carbohydrate."  Lunch:   A Sandwich using the bread choices listed, Can use any  Eggs,  lunchmeat, grilled meat or canned tuna), avocado, regular  mayo/mustard  and cheese.  A Salad using blue cheese, ranch,  Goddess or vinagrette,  No croutons or "confetti" and no "candied nuts" but regular nuts OK.   No pretzels or chips.  Pickles and miniature sweet peppers are a good low carb alternative that provide a "crunch"  The bread is the only source of carbohydrate in a sandwich and  can be decreased by trying some of these alternatives to traditional loaf bread  Joseph's makes a pita bread and a flat bread that are 50 cal and 4 net carbs available at [place] and [place].  This can be toasted to use with hummous as well  Toufayan makes a low carb flatbread that's 100 cal and 9 net carbs available at [employer] and [employer]J's makes 2 sizes of  Low carb whole wheat tortilla  (The large one is 210 cal and 6 net carbs) Avoid "Low fat dressings, as well as Barry Brunner and [place] dressings They are loaded with sugar!   3 PM/ Mid day  Snack:  Consider  1 ounce of  almonds, walnuts, pistachios, pecans, peanuts,  Macadamia nuts or a nut medley.  Avoid "granola"; the dried cranberries and raisins are loaded with carbohydrates. Mixed  nuts as long as there are no raisins,  cranberries or dried fruit.    Try the prosciutto/mozzarella cheese sticks by Fiorruci  In deli /backery section   High protein      6 PM  Dinner:     Meat/fowl/fish with a green salad, and either broccoli, cauliflower, green beans, spinach, brussel sprouts or  Lima beans. DO NOT BREAD THE PROTEIN!!      There is a low carb pasta by Dreamfield's that is acceptable and tastes great: only 5 digestible carbs/serving.( All grocery stores but BJs carry it )  Try Hurley Cisco Angelo's chicken piccata or chicken or eggplant parm over low carb pasta.(Lowes and BJs)   Marjory Lies Sanchez's "Carnitas" (pulled pork, no sauce,  0 carbs) or his beef pot roast to make a dinner burrito (at BJ's)  Pesto over low carb pasta (bj's sells a good quality pesto in the center refrigerated section of the deli   Try  sauteing  Cheral Marker with mushroooms  Whole wheat pasta is still full of digestible carbs and  Not as low in glycemic index as Dreamfield's.   Brown rice is still rice,  So skip the rice and noodles if you eat [place] or [place] (or at least limit to 1/2 cup)  9 PM snack :   Breyer's "low carb" fudgsicle or  ice cream bar (Carb Smart line), or  Weight Watcher's ice cream bar , or another "no sugar added" ice cream;  a serving of fresh berries/cherries with whipped cream   Cheese or DANNON'S LlGHT N FIT [place] YOGURT  8 ounces of [place] unsweetened almond/cococunut milk  , great source of calcium,  Only 1 carb 45 cal   Avoid bananas, pineapple, grapes  and watermelon on a regular basis because they are high in sugar.  THINK OF THEM AS DESSERT  Remember that snack Substitutions should be less than 10 NET carbs per serving and meals < 20 carbs. Remember to subtract fiber grams to get the "net carbs."

## 2013-09-08 NOTE — Progress Notes (Signed)
Pre-visit discussion using our clinic review tool. No additional management support is needed unless otherwise documented below in the visit note.  

## 2013-09-08 NOTE — Progress Notes (Signed)
Patient ID: Alexis Bradley, female   DOB: January 06, 1966, 48 y.o.   MRN: [id number]    Subjective:     Alexis Bradley is a 48 y.o. female and is here for a comprehensive physical exam. The patient reports as described below.   Has lost significant amount of wt intentionally ,  BMI now < 25  Recurrent infrequent episodes of palpitations and ear syncope due to brradycardia  Wants a1c rechecked   History   Social History  . Marital Status: Married    Spouse Name: N/A    Number of Children: N/A  . Years of Education: N/A   Occupational History  . 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 Smoking status: Never Smoker   . Smokeless tobacco: Never Used  . Alcohol Use: 1.5 oz/week    3 drink(s) per week     Comment: Rarely  . Drug Use: No  . Sexual Activity: Not on file   Other Topics Concern  . Not on file   Social History Narrative   Married   Health Maintenance  Topic Date Due  . Tetanus/tdap  09/16/1982  . Influenza Vaccine  11/25/2013  . Pap Smear  09/04/2016    The following portions of the patient's history were reviewed and updated as appropriate: allergies, current medications, past family history, past medical history, past social history, past surgical history and problem list.  Review of Systems A comprehensive review of systems was negative.   Objective:   BP 104/68  Pulse 60  Temp(Src) 97.9 F (36.6 C) (Oral)  Resp 16  Ht 5' 4.25" (1.632 m)  Wt 142 lb 12 oz (64.751 kg)  BMI 24.31 kg/m2  SpO2 99%  General appearance: alert, cooperative and appears stated age Head: Normocephalic, without obvious abnormality, atraumatic Eyes: conjunctivae/corneas clear. PERRL, EOM's intact. Fundi benign. Ears: normal TM's and external ear canals both ears Nose: Nares normal. Septum midline. Mucosa normal. No drainage or sinus tenderness. Throat: lips, mucosa, and tongue normal; teeth and  gums normal Neck: no adenopathy, no carotid bruit, no JVD, supple, symmetrical, trachea midline and thyroid not enlarged, symmetric, no tenderness/mass/nodules Lungs: clear to auscultation bilaterally Breasts: normal appearance, no masses or tenderness Heart: regular rate and rhythm, S1, S2 normal, no murmur, click, rub or gallop Abdomen: soft, non-tender; bowel sounds normal; no masses,  no organomegaly Extremities: extremities normal, atraumatic, no cyanosis or edema Pulses: 2+ and symmetric Skin: Skin color, texture, turgor normal. No rashes or lesions Neurologic: Alert and oriented X 3, normal strength and tone. Normal symmetric reflexes. Normal coordination and gait.     Assessment and Plan:   PREMATURE VENTRICULAR CONTRACTIONS On low dose metoprolol but having recurrent episodes of and bradycardia.  Discussed trial of diltiazem.  Risks of fluid retention discussed.   Visit for preventive health examination Annual comprehensive exam was done including breast, excluding pelvic and PAP smear. All screenings have been addressed .   Varicose veins of lower extremities with other complications Recommended use of compression knee highs when able for management  Loss of weight I have congratulated her in reduction of   BMI and loss of 46 lbs to day. A1c is now in normal range.   Lab Results  Component Value Date   HGBA1C 5.6 09/08/2013    Lab Results  Component Value Date   TSH 3.11 03/31/2013      Updated Medication List Outpatient  Encounter Prescriptions as of 09/08/2013  Medication Sig  . Ascorbic Acid (VITAMIN C) 1000 MG tablet Take 1,000 mg by mouth daily.  Marland Kitchen MAGNESIUM PO Take 1 tablet by mouth daily.    . metoprolol succinate (TOPROL-XL) 25 MG 24 hr tablet Take 12.5 mg by mouth as needed.  . Multiple Vitamin (MULTIVITAMIN) tablet Take 1 tablet by mouth daily.  Marland Kitchen diltiazem (CARDIZEM) 30 MG tablet Take 1 tablet (30 mg total) by mouth 4 (four) times daily.  Marland Kitchen NEXIUM 40 MG  capsule TAKE ONE CAPSULE BY MOUTH EVERY DAY

## 2013-09-10 ENCOUNTER — Encounter: Payer: Self-pay | Admitting: Internal Medicine

## 2013-09-10 DIAGNOSIS — R634 Abnormal weight loss: Secondary | ICD-10-CM | POA: Insufficient documentation

## 2013-09-10 DIAGNOSIS — Z0000 Encounter for general adult medical examination without abnormal findings: Secondary | ICD-10-CM | POA: Insufficient documentation

## 2013-09-10 NOTE — Assessment & Plan Note (Signed)
I have congratulated her in reduction of   BMI and loss of 46 lbs to day. A1c is now in normal range.   Lab Results  Component Value Date   HGBA1C 5.6 09/08/2013    Lab Results  Component Value Date   TSH 3.11 03/31/2013

## 2013-09-10 NOTE — Assessment & Plan Note (Addendum)
On low dose metoprolol but having recurrent episodes of and bradycardia.  Discussed trial of diltiazem.  Risks of fluid retention discussed.

## 2013-09-10 NOTE — Assessment & Plan Note (Signed)
Recommended use of compression knee highs when able for management

## 2013-09-10 NOTE — Assessment & Plan Note (Signed)
Annual comprehensive exam was done including breast, excluding pelvic and PAP smear. All screenings have been addressed .  

## 2013-09-12 NOTE — Telephone Encounter (Signed)
Mailed unread message to pt  

## 2013-09-26 ENCOUNTER — Encounter: Payer: Self-pay | Admitting: Cardiology

## 2014-04-23 ENCOUNTER — Other Ambulatory Visit: Payer: Self-pay | Admitting: *Deleted

## 2014-04-23 DIAGNOSIS — I83892 Varicose veins of left lower extremities with other complications: Secondary | ICD-10-CM

## 2014-05-14 ENCOUNTER — Encounter: Payer: Self-pay | Admitting: Vascular Surgery

## 2014-05-15 ENCOUNTER — Ambulatory Visit (INDEPENDENT_AMBULATORY_CARE_PROVIDER_SITE_OTHER): Payer: BC Managed Care – PPO | Admitting: Vascular Surgery

## 2014-05-15 ENCOUNTER — Encounter: Payer: Self-pay | Admitting: Vascular Surgery

## 2014-05-15 ENCOUNTER — Ambulatory Visit (HOSPITAL_COMMUNITY)
Admission: RE | Admit: 2014-05-15 | Discharge: 2014-05-15 | Disposition: A | Discharge status: Home / Self Care | Payer: BC Managed Care – PPO | Source: Ambulatory Visit | Attending: Vascular Surgery | Admitting: Vascular Surgery

## 2014-05-15 VITALS — BP 148/79 | HR 64 | Resp 16 | Ht 64.0 in | Wt 137.0 lb

## 2014-05-15 DIAGNOSIS — I83892 Varicose veins of left lower extremities with other complications: Secondary | ICD-10-CM

## 2014-05-15 DIAGNOSIS — I83899 Varicose veins of unspecified lower extremities with other complications: Secondary | ICD-10-CM | POA: Insufficient documentation

## 2014-05-15 NOTE — Progress Notes (Signed)
Subjective:     Patient ID: Alexis Bradley, female   DOB: October 13, 1965, 50 y.o.   MRN: [id number]  HPI this 49 year old female had laser ablation of bilateral great saphenous veins plus bilateral stab phlebectomy performed by me in 2013. She had an excellent early result. Recently she noticed some "bruising" in the left distal medial thigh area and some discomfort. She also noticed a prominent vein on the anterior distal thigh. She has no history of DVT thrombophlebitis stasis ulcers or bleeding. She does not were elastic compression stockings now. She is evaluated today for recurrent varicosities.  Past Medical History  Diagnosis Date  . Palpitations     Holder 2009 with PVCs and bigeminy. Holter 1/12 with PVCs and short runs of atrial tachycardia (up to 9 beats)  . S/P left heart catheterization by percutaneous approach 2009    Apparently done after patient passed out during a treadmill stress test. Luminal irregularities only in coronaries. EF 60%  . Fatigue   . 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 GERD (gastroesophageal reflux disease) Feb 2011    esophagitis by EGD Feb 2011  . Gastric polyps March 2012    Endoscopy  . Hiatal hernia   . Poor circulation of extremity     Bil legs  . Irregular heartbeat   . Varicose veins     History  Substance Use Topics  . Smoking status: Never Smoker   . Smokeless tobacco: Never Used  . Alcohol Use: 1.5 oz/week    3 drink(s) per week     Comment: Rarely    Family History  Problem Relation Age of Onset  . Coronary artery disease Neg Hx     Premature  . Stroke Other     Grandfather  . Cancer Sister     breast  . Breast cancer Sister   . Breast cancer Maternal Grandmother   . Breast cancer Paternal Grandmother   . Heart disease Paternal Grandfather     Allergies   Allergen Reactions  . Tape Hives and Other (See Comments)    redness     Current outpatient prescriptions:  .  Ascorbic Acid (VITAMIN C) 1000 MG tablet, Take 1,000 mg by mouth daily., Disp: , Rfl:  .  diltiazem (CARDIZEM) 30 MG tablet, Take 1 tablet (30 mg total) by mouth 4 (four) times daily., Disp: 120 tablet, Rfl: 0 .  MAGNESIUM PO, Take 1 tablet by mouth daily.  , Disp: , Rfl:  .  metoprolol succinate (TOPROL-XL) 25 MG 24 hr tablet, Take 12.5 mg by mouth as needed., Disp: , Rfl:  .  Multiple Vitamin (MULTIVITAMIN) tablet, Take 1 tablet by mouth daily., Disp: , Rfl:  .  NEXIUM 40 MG capsule, TAKE ONE CAPSULE BY MOUTH EVERY DAY (Patient not taking: Reported on 05/15/2014), Disp: 30 capsule, Rfl: 1  BP 148/79 mmHg  Pulse 64  Resp 16  Ht 5\' 4"  (1.626 m)  Wt 137 lb (62.143 kg)  BMI 23.50 kg/m2  Body mass index is 23.5 kg/(m^2).           Review of Systems chest pain, dyspnea on exertion, PND, orthopnea, claudication. Patient has lost 60 pounds over the past 2 years   by adjusting her diet and activity level. Objective:   Physical Exam BP 148/79 mmHg  Pulse 64  Resp 16  Ht  5\' 4"  (1.626 m)  Wt 137 lb (62.143 kg)  BMI 23.50 kg/m2  Gen. well-developed well-nourished female no apparent stress alert and oriented 3 Lungs no rhonchi or wheezing Left leg with prominent vein in the anterior mid to distal thigh. No bulging varicosities noted. Minimal edema distally with 3+ dorsalis pedis pulse.  Today I ordered a venous duplex exam the left leg which I reviewed and interpreted. Left great saphenous vein is totally closed and there is no DVT. There is some deep reflux. There is an anterior accessory branch which is supplying these bulges the caliber of 0.41 in the proximal thigh.     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 We will discuss this with her and she will decide if she would like to proceed with this.

## 2014-07-17 ENCOUNTER — Ambulatory Visit (INDEPENDENT_AMBULATORY_CARE_PROVIDER_SITE_OTHER): Payer: BC Managed Care – PPO | Admitting: Nurse Practitioner

## 2014-07-17 ENCOUNTER — Encounter: Payer: Self-pay | Admitting: Nurse Practitioner

## 2014-07-17 VITALS — BP 122/70 | HR 56 | Resp 12 | Ht 64.0 in | Wt 139.0 lb

## 2014-07-17 DIAGNOSIS — M7522 Bicipital tendinitis, left shoulder: Secondary | ICD-10-CM | POA: Insufficient documentation

## 2014-07-17 DIAGNOSIS — M778 Other enthesopathies, not elsewhere classified: Secondary | ICD-10-CM | POA: Diagnosis not present

## 2014-07-17 DIAGNOSIS — M779 Enthesopathy, unspecified: Principal | ICD-10-CM

## 2014-07-17 NOTE — Progress Notes (Signed)
Pre visit review using our clinic review tool, if applicable. No additional management support is needed unless otherwise documented below in the visit note. 

## 2014-07-17 NOTE — Progress Notes (Signed)
   Subjective:    Patient ID: Alexis Bradley, female    DOB: 12-Jan-1966, 49 y.o.   MRN: [id number]  HPI  Alexis Bradley is a 49 yo female with a CC of left arm pain worsening over 3 weeks.   1) Left arm pain- aching in the upper arm, waking her up at night time,  No trauma Painful  Worst- 5/10, 0/10 Best  Best a rest  Worse when reaching across the body or behind her.  Treatment to date- Advil- helpful  2 tablets q 4 hours   Review of Systems  Constitutional: Negative for fever, chills, diaphoresis and fatigue.  Respiratory: Negative for chest tightness, shortness of breath and wheezing.   Cardiovascular: Negative for chest pain, palpitations and leg swelling.  Gastrointestinal: Negative for nausea, vomiting and diarrhea.  Musculoskeletal: Positive for arthralgias.       Left arm pain  Skin: Negative for rash.  Neurological: Negative for dizziness, weakness, numbness and headaches.  Psychiatric/Behavioral: The patient is not nervous/anxious.       Objective:   Physical Exam  Constitutional: She is oriented to person, place, and time. She appears well-developed and well-nourished. No distress.  BP 122/70 mmHg  Pulse 56  Resp 12  Ht 5\' 4"  (1.626 m)  Wt 139 lb (63.05 kg)  BMI 23.85 kg/m2  SpO2 99%   HENT:  Head: Normocephalic and atraumatic.  Right Ear: External ear normal.  Left Ear: External ear normal.  Cardiovascular: Normal rate, regular rhythm, normal heart sounds and intact distal pulses.  Exam reveals no gallop and no friction rub.   No murmur heard. Pulmonary/Chest: Effort normal and breath sounds normal. No respiratory distress. She has no wheezes. She has no rales. She exhibits no tenderness.  Musculoskeletal: She exhibits tenderness. She exhibits no edema.  Tenderness over tricep on left and down to elbow. Worse with extension of arm against resistance.   Neurological: She is alert and oriented to person, place, and time. No cranial nerve deficit. She exhibits  normal muscle tone. Coordination normal.  Skin: Skin is warm and dry. No rash noted. She is not diaphoretic.  Psychiatric: She has a normal mood and affect. Her behavior is normal. Judgment and thought content normal.          Assessment & Plan:

## 2014-07-17 NOTE — Patient Instructions (Signed)

## 2014-07-17 NOTE — Assessment & Plan Note (Signed)
Tendonitis probable on left.

## 2014-07-23 NOTE — Assessment & Plan Note (Signed)
Tricep tendonitis probable. Will try rest, ice, and continue Advil

## 2014-08-06 ENCOUNTER — Encounter: Payer: Self-pay | Admitting: Internal Medicine

## 2014-08-06 ENCOUNTER — Ambulatory Visit (INDEPENDENT_AMBULATORY_CARE_PROVIDER_SITE_OTHER): Payer: BC Managed Care – PPO | Admitting: Internal Medicine

## 2014-08-06 VITALS — BP 140/76 | HR 59 | Temp 97.8°F | Resp 14 | Ht 64.0 in | Wt 140.8 lb

## 2014-08-06 DIAGNOSIS — M7551 Bursitis of right shoulder: Secondary | ICD-10-CM

## 2014-08-06 DIAGNOSIS — E663 Overweight: Secondary | ICD-10-CM | POA: Diagnosis not present

## 2014-08-06 DIAGNOSIS — R634 Abnormal weight loss: Secondary | ICD-10-CM | POA: Diagnosis not present

## 2014-08-06 DIAGNOSIS — M7552 Bursitis of left shoulder: Secondary | ICD-10-CM | POA: Diagnosis not present

## 2014-08-06 MED ORDER — TRAMADOL HCL 50 MG PO TABS: 50.0000 mg | ORAL_TABLET | Freq: Every day | ORAL | Status: DC

## 2014-08-06 MED ORDER — DICLOFENAC SODIUM 75 MG PO TBEC: 75.0000 mg | DELAYED_RELEASE_TABLET | Freq: Two times a day (BID) | ORAL | Status: DC

## 2014-08-06 NOTE — Progress Notes (Signed)
Patient ID: Alexis Bradley, female   DOB: [birth year], 49 y.o.   MRN: [id number]  Patient Active Problem List   Diagnosis Date Noted  . Subacromial or subdeltoid bursitis 08/07/2014  . Overweight 08/07/2014  . Biceps tendonitis on left 07/17/2014  . Triceps tendonitis 07/17/2014  . Varicose veins of leg with complications 41/32/4401  . Visit for preventive health examination 09/10/2013  . Loss of weight 09/10/2013  . Bradycardia 06/02/2013  . Herpes zoster 09/22/2012  . Varicose veins of lower extremities with other complications 02/72/5366  . Gastric polyps   . Aortic valve insufficiency 06/20/2010  . EDEMA 05/19/2010  . SHORTNESS OF BREATH 05/19/2010  . GERD (gastroesophageal reflux disease) 05/28/2009  . PREMATURE VENTRICULAR CONTRACTIONS 10/02/2008  . GERD 10/02/2008  . PALPITATIONS 10/02/2008    Subjective:  CC:   Chief Complaint  Patient presents with  . Shoulder Pain    left shoulder pain X 2 months, does not recall any injury just started hurting mainly at night.    HPI:   Alexis Bradley is a 49 y.o. female who presents for  Right Shoulder pain for two months.  There has been no prior injury .  She was treated for biceps/triceps tendonitis on March 22 with rest, ice and advil, but the pain has not resolved.  It wakes her up at night,  But is temporarily relieved with advil.  The pain radiates down her upper arm .      Past Medical History  Diagnosis Date  . Palpitations     Holder 2009 with PVCs and bigeminy. Holter 1/12 with PVCs and short runs of atrial tachycardia (up to 9 beats)  . S/P left heart catheterization by percutaneous approach 2009    Apparently done after patient passed out during a treadmill stress test. Luminal irregularities only in coronaries. EF 60%  . Fatigue   . Echocardiogram abnormal 05/2010    EF 65-70%; mild to moderate aortic insufficiency (trileaflet aortic valve), mild mitral regurgitation, PA systolic pressure 35 mmHg, ascending aorta  dilated to 3.7 cm  . GERD (gastroesophageal reflux disease) Feb 2011    esophagitis by EGD Feb 2011  . Gastric polyps March 2012    Endoscopy  . Hiatal hernia   . Poor circulation of extremity     Bil legs  . Irregular heartbeat   . Varicose veins     Past Surgical History  Procedure Laterality Date  . Cardiac catheterization  2009    Left heart cath  . Cholecystectomy    . Vesicovaginal fistula closure w/ tah    . Cesarean section    . Abdominal hysterectomy    . Right gsv elas with 10-20 stab phlebectomies right calf  05-25-11       The following portions of the patient's history were reviewed and updated as appropriate: Allergies, current medications, and problem list.    Review of Systems:   Patient denies headache, fevers, malaise, unintentional weight loss, skin rash, eye pain, sinus congestion and sinus pain, sore throat, dysphagia,  hemoptysis , cough, dyspnea, wheezing, chest pain, palpitations, orthopnea, edema, abdominal pain, nausea, melena, diarrhea, constipation, flank pain, dysuria, hematuria, urinary  Frequency, nocturia, numbness, tingling, seizures,  Focal weakness, Loss of consciousness,  Tremor, insomnia, depression, anxiety, and suicidal ideation.     History   Social History  . Marital Status: Married    Spouse Name: N/A  . Number of Children: N/A  . Years of Education: N/A   Occupational History  .  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 Smoking status: Never Smoker   . Smokeless tobacco: Never Used  . Alcohol Use: 1.5 oz/week    3 drink(s) per week     Comment: Rarely  . Drug Use: No  . Sexual Activity: Not on file   Other Topics Concern  . Not on file   Social History Narrative   Married    Objective:  Filed Vitals:   08/06/14 1710  BP: 140/76  Pulse: 59  Temp: 97.8 F (36.6 C)  Resp: 14     General appearance: alert, cooperative and  appears stated age Ears: normal TM's and external ear canals both ears Throat: lips, mucosa, and tongue normal; teeth and gums normal Neck: no adenopathy, no carotid bruit, supple, symmetrical, trachea midline and thyroid not enlarged, symmetric, no tenderness/mass/nodules Back: symmetric, no curvature. ROM normal. No CVA tenderness. Lungs: clear to auscultation bilaterally Heart: regular rate and rhythm, S1, S2 normal, no murmur, click, rub or gallop Abdomen: soft, non-tender; bowel sounds normal; no masses,  no organomegaly Pulses: 2+ and symmetric Skin: Skin color, texture, turgor normal. No rashes or lesions Lymph nodes: Cervical, supraclavicular, and axillary nodes normal.  Assessment and Plan:  Subacromial or subdeltoid bursitis Suspected, given failure to resolve with rest and nsaids.  Referral to Dr Charlann Boxer for evaluation and treatment.  Changing NSAID to diclofenac, adding tramadol for pain control.    Loss of weight I have congratulated her in reduction of   BMI of 32 in May 2014 to Body mass index is 24.15 kg/(m^2). using a low glycemic index diet and regular exercise.      Updated Medication List Outpatient Encounter Prescriptions as of 08/06/2014  Medication Sig  . Ascorbic Acid (VITAMIN C) 1000 MG tablet Take 1,000 mg by mouth daily.  Marland Kitchen MAGNESIUM PO Take 1 tablet by mouth daily.    . metoprolol succinate (TOPROL-XL) 25 MG 24 hr tablet Take 12.5 mg by mouth as needed.  . Multiple Vitamin (MULTIVITAMIN) tablet Take 1 tablet by mouth daily.  . diclofenac (VOLTAREN) 75 MG EC tablet Take 1 tablet (75 mg total) by mouth 2 (two) times daily.  . traMADol (ULTRAM) 50 MG tablet Take 1 tablet (50 mg total) by mouth at bedtime.     Orders Placed This Encounter  Procedures  . Ambulatory referral to Sports Medicine    No Follow-up on file.

## 2014-08-06 NOTE — Patient Instructions (Signed)
Referral to Dr Tamala Julian for presumed subacromial bursitis  I am changing your anti inflammatory to diclofenac  Twice daily  No more advil   add tylenol 100 mg twice daily  Add tramadol at bedtime,

## 2014-08-06 NOTE — Progress Notes (Signed)
Pre-visit discussion using our clinic review tool. No additional management support is needed unless otherwise documented below in the visit note.  

## 2014-08-07 ENCOUNTER — Encounter: Payer: Self-pay | Admitting: Internal Medicine

## 2014-08-07 DIAGNOSIS — M7550 Bursitis of unspecified shoulder: Secondary | ICD-10-CM | POA: Insufficient documentation

## 2014-08-07 DIAGNOSIS — M7552 Bursitis of left shoulder: Secondary | ICD-10-CM | POA: Insufficient documentation

## 2014-08-07 DIAGNOSIS — E663 Overweight: Secondary | ICD-10-CM | POA: Insufficient documentation

## 2014-08-07 NOTE — Assessment & Plan Note (Addendum)
Suspected, given failure to resolve with rest and nsaids.  Referral to Dr Charlann Boxer for evaluation and treatment.  Changing NSAID to diclofenac, adding tramadol for pain control.

## 2014-08-07 NOTE — Assessment & Plan Note (Signed)
I have congratulated her in reduction of   BMI of 32 in May 2014 to Body mass index is 24.15 kg/(m^2). using a low glycemic index diet and regular exercise.

## 2014-08-12 ENCOUNTER — Encounter: Payer: Self-pay | Admitting: Internal Medicine

## 2014-08-13 NOTE — Telephone Encounter (Signed)
Please advise 

## 2014-09-03 ENCOUNTER — Encounter: Payer: Self-pay | Admitting: Family Medicine

## 2014-09-03 ENCOUNTER — Other Ambulatory Visit (INDEPENDENT_AMBULATORY_CARE_PROVIDER_SITE_OTHER): Payer: BC Managed Care – PPO

## 2014-09-03 ENCOUNTER — Ambulatory Visit (INDEPENDENT_AMBULATORY_CARE_PROVIDER_SITE_OTHER): Payer: BC Managed Care – PPO | Admitting: Family Medicine

## 2014-09-03 VITALS — BP 126/74 | HR 55 | Ht 64.0 in | Wt 137.0 lb

## 2014-09-03 DIAGNOSIS — M7502 Adhesive capsulitis of left shoulder: Secondary | ICD-10-CM

## 2014-09-03 DIAGNOSIS — M79602 Pain in left arm: Secondary | ICD-10-CM | POA: Diagnosis not present

## 2014-09-03 DIAGNOSIS — M7500 Adhesive capsulitis of unspecified shoulder: Secondary | ICD-10-CM | POA: Insufficient documentation

## 2014-09-03 NOTE — Assessment & Plan Note (Signed)
Patient was given an injection today. We discussed icing regimen and home exercises. Patient did work with Product/process development scientist today. Discussed the progression of frozen shoulder. Patient also has the posterior labral tear that could also be affecting some of this we may need more advanced treatment in the future. Patient continues to have difficulty we may need to consider advanced imaging versus possibly formal physical therapy. Patient all follow-up in 3 weeks.

## 2014-09-03 NOTE — Progress Notes (Signed)
Pre visit review using our clinic review tool, if applicable. No additional management support is needed unless otherwise documented below in the visit note. 

## 2014-09-03 NOTE — Patient Instructions (Signed)
Good to see you.  Ice 20 minutes 2 times daily. Usually after activity and before bed. Exercises 3 times a week.  pennsaid pinkie amount topically 2 times daily as needed.  Turmeric 500mg  twice daily Vitamin D 2000 IU daily See me again in 3 weeks.

## 2014-09-03 NOTE — Progress Notes (Signed)
[facility] [address] [place], [place] [address] Phone: [phone] Subjective:    I'm seeing this patient by the request  of:  TULLO, TERESA L, MD   CC: Left shoulder pain  [name] is a 49 y.o. female coming in with complaint of left shoulder pain. She has had this pain for approximately 3 months. Patient states that certain activities of daily living has become difficult. Such as getting her bra on. Patient also states that at night it can be very difficult to find a comfortable position. Patient has tried anti-inflammatory which has been somewhat beneficial. Patient has not tried any other home modalities. Does not remember any injury. Rates the severity of pain as 5 out of 10. Describes it as a dull throbbing ache that can have sharp pain with certain movements.  Past Medical History  Diagnosis Date  . Palpitations     Holder 2009 with PVCs and bigeminy. Holter 1/12 with PVCs and short runs of atrial tachycardia (up to 9 beats)  . S/P left heart catheterization by percutaneous approach 2009    Apparently done after patient passed out during a treadmill stress test. Luminal irregularities only in coronaries. EF 60%  . Fatigue   . 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 GERD (gastroesophageal reflux disease) Feb 2011    esophagitis by EGD Feb 2011  . Gastric polyps March 2012    Endoscopy  . Hiatal hernia   . Poor circulation of extremity     Bil legs  . Irregular heartbeat   . Varicose veins    Past Surgical History  Procedure Laterality Date  . Cardiac catheterization  2009    Left heart cath  . Cholecystectomy    . Vesicovaginal fistula closure w/ tah    . Cesarean section    . Abdominal hysterectomy    . Right gsv elas with 10-20 stab phlebectomies right  calf  05-25-11   History   Social History  . Marital Status: Married    Spouse Name: N/A  . Number of Children: N/A  . Years of Education: N/A   Occupational History  . 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 Smoking status: Never Smoker   . Smokeless tobacco: Never Used  . Alcohol Use: 1.5 oz/week    3 drink(s) per week     Comment: Rarely  . Drug Use: No  . Sexual Activity: Not on file   Other Topics Concern  . Not on file   Social History Narrative   Married   Allergies  Allergen Reactions  . Tape Hives and Other (See Comments)    redness   Family History  Problem Relation Age of Onset  . Coronary artery disease Neg Hx     Premature  . Stroke Other     Grandfather  . Cancer Sister     breast  . Breast cancer Sister   . Breast cancer Maternal Grandmother   . Breast cancer Paternal Grandmother   . Heart disease Paternal Grandfather         Past medical history, social, surgical and family history all reviewed in electronic medical record.   Review of Systems: No headache, visual changes, nausea, vomiting, diarrhea, constipation,  dizziness, abdominal pain, skin rash, fevers, chills, night sweats, weight loss, swollen lymph nodes, body aches, joint swelling, muscle aches, chest pain, shortness of breath, mood changes.   Objective Blood pressure 126/74, pulse 55, height 5\' 4"  (1.626 m), weight 137 lb (62.143 kg), SpO2 98 %.  General: No apparent distress alert and oriented x3 mood and affect normal, dressed appropriately.  HEENT: Pupils equal, extraocular movements intact  Respiratory: Patient's speak in full sentences and does not appear short of breath  Cardiovascular: No lower extremity edema, non tender, no erythema  Skin: Warm dry intact with no signs of infection or rash on extremities or on axial skeleton.  Abdomen: Soft nontender  Neuro: Cranial nerves II through XII are  intact, neurovascularly intact in all extremities with 2+ DTRs and 2+ pulses.  Lymph: No lymphadenopathy of posterior or anterior cervical chain or axillae bilaterally.  Gait normal with good balance and coordination.  MSK:  Non tender with full range of motion and good stability and symmetric strength and tone of  elbows, wrist, hip, knee and ankles bilaterally.  Shoulder: left Inspection reveals no abnormalities, atrophy or asymmetry. Palpation is normal with no tenderness over AC joint or bicipital groove. Motion shows the patient does have a decrease in external range of motion to 5, internal range of motion to sacrum, and forward flexion to 170 Rotator cuff strength normal throughout. signs of impingement with positive Neer and Hawkin's tests, but negative empty can sign. Speeds and Yergason's tests normal. All positive labral pathology with O'Brien's Normal scapular function observed. No painful arc and no drop arm sign. No apprehension sign Lateral shoulder unremarkable.  MSK US performed of: left This study was ordered, performed, and interpreted by Charlann Boxer D.O.  Shoulder:   Supraspinatus:  Appears normal on long and transverse views, Bursal bulge seen with shoulder abduction on impingement view. Infraspinatus:  Appears normal on long and transverse views. Significant increase in Doppler flow Subscapularis:  Appears normal on long and transverse views. Positive bursa Teres Minor:  Appears normal on long and transverse views. AC joint:  Capsule undistended, no geyser sign. Glenohumeral Joint:  Appears normal without effusion. Glenoid Labrum:  Patient does have a tear that is noted in in the posterior labrum. On the humeral side. Biceps Tendon:  Appears normal on long and transverse views, no fraying of tendon, tendon located in intertubercular groove, no subluxation with shoulder internal or external rotation.  Impression: Subacromial bursitis, tear of the posterior labrum,  frozen shoulder  Procedure: Real-time Ultrasound Guided Injection of left glenohumeral joint Device: GE Logiq E  Ultrasound guided injection is preferred based studies that show increased duration, increased effect, greater accuracy, decreased procedural pain, increased response rate with ultrasound guided versus blind injection.  Verbal informed consent obtained.  Time-out conducted.  Noted no overlying erythema, induration, or other signs of local infection.  Skin prepped in a sterile fashion.  Local anesthesia: Topical Ethyl chloride.  With sterile technique and under real time ultrasound guidance:  Joint visualized.  23g 1  inch needle inserted posterior approach. Pictures taken for needle placement. Patient did have injection of 2 cc of 1% lidocaine, 2 cc of 0.5% Marcaine, and 1.0 cc of Kenalog 40 mg/dL. Completed without difficulty  Pain immediately resolved suggesting accurate placement of the medication.  Advised to call if fevers/chills, erythema, induration, drainage, or persistent bleeding.  Images permanently stored and available for review in the ultrasound unit.  Impression: Technically successful ultrasound guided injection.  procedure note 99774; 15 minutes spent for Therapeutic exercises as stated in above notes.  This included exercises focusing on stretching, strengthening, with significant focus on eccentric aspects.  Basic scapular stabilization to include adduction and depression of scapula Scaption, focusing on proper movement and good control Internal and External rotation utilizing a theraband, with elbow tucked at side entire time Rows with theraband Proper technique shown and discussed handout in great detail with ATC.  All questions were discussed and answered.    Impression and Recommendations:     This case required medical decision making of moderate complexity.

## 2014-09-14 ENCOUNTER — Other Ambulatory Visit: Payer: Self-pay | Admitting: Internal Medicine

## 2014-09-14 DIAGNOSIS — F5102 Adjustment insomnia: Secondary | ICD-10-CM

## 2014-09-14 MED ORDER — ALPRAZOLAM 0.5 MG PO TABS
0.5000 mg | ORAL_TABLET | Freq: Every evening | ORAL | Status: DC | PRN
Start: 2014-09-14 — End: 2015-01-04

## 2014-09-25 ENCOUNTER — Encounter: Payer: Self-pay | Admitting: Family Medicine

## 2014-09-25 ENCOUNTER — Ambulatory Visit (INDEPENDENT_AMBULATORY_CARE_PROVIDER_SITE_OTHER): Payer: BC Managed Care – PPO | Admitting: Family Medicine

## 2014-09-25 VITALS — BP 98/74 | HR 54 | Ht 64.0 in | Wt 140.0 lb

## 2014-09-25 DIAGNOSIS — M7502 Adhesive capsulitis of left shoulder: Secondary | ICD-10-CM

## 2014-09-25 DIAGNOSIS — R599 Enlarged lymph nodes, unspecified: Secondary | ICD-10-CM

## 2014-09-25 DIAGNOSIS — R591 Generalized enlarged lymph nodes: Secondary | ICD-10-CM

## 2014-09-25 MED ORDER — AMOXICILLIN-POT CLAVULANATE 875-125 MG PO TABS: 1.0000 | ORAL_TABLET | Freq: Two times a day (BID) | ORAL | Status: DC

## 2014-09-25 NOTE — Progress Notes (Signed)
Pre visit review using our clinic review tool, if applicable. No additional management support is needed unless otherwise documented below in the visit note. 

## 2014-09-25 NOTE — Progress Notes (Signed)
[name] [address] [place], [place] [address] Phone: [phone] Subjective:     CC: Left shoulder pain follow up  Alexis Bradley is a 49 y.o. female coming in with complaint of left shoulder pain. Patient was found to have subacromial bursitis, posterior labral tear, as well as frozen shoulder. Patient states though after the injection she is approximately 80-85% better. Patient states that she's can sleep comfortably at night and do daily activities. Still some soreness with certain range of motion. Still not full range of motion.  Recent mild neck pain on the right side. Patient states he can hurt a little bit to swallow. Denies any fevers chills or any abnormal weight loss.  Past Medical History  Diagnosis Date  . Palpitations     Holder 2009 with PVCs and bigeminy. Holter 1/12 with PVCs and short runs of atrial tachycardia (up to 9 beats)  . S/P left heart catheterization by percutaneous approach 2009    Apparently done after patient passed out during a treadmill stress test. Luminal irregularities only in coronaries. EF 60%  . Fatigue   . 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 GERD (gastroesophageal reflux disease) Feb 2011    esophagitis by EGD Feb 2011  . Gastric polyps March 2012    Endoscopy  . Hiatal hernia   . Poor circulation of extremity     Bil legs  . Irregular heartbeat   . Varicose veins    Past Surgical History  Procedure Laterality Date  . Cardiac catheterization  2009    Left heart cath  . Cholecystectomy    . Vesicovaginal fistula closure w/ tah    . Cesarean section    . Abdominal hysterectomy    . Right gsv elas with 10-20 stab phlebectomies right calf  05-25-11   History   Social History  . Marital Status: Married    Spouse Name:  N/A  . Number of Children: N/A  . Years of Education: N/A   Occupational History  . 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 Smoking status: Never Smoker   . Smokeless tobacco: Never Used  . Alcohol Use: 1.5 oz/week    3 drink(s) per week     Comment: Rarely  . Drug Use: No  . Sexual Activity: Not on file   Other Topics Concern  . Not on file   Social History Narrative   Married   Allergies  Allergen Reactions  . Tape Hives and Other (See Comments)    redness   Family History  Problem Relation Age of Onset  . Coronary artery disease Neg Hx     Premature  . Stroke Other     Grandfather  . Cancer Sister     breast  . Breast cancer Sister   . Breast cancer Maternal Grandmother   . Breast cancer Paternal Grandmother   . Heart disease Paternal Grandfather         Past medical history, social, surgical and family history all reviewed in electronic medical record.   Review of Systems: No headache, visual changes, nausea, vomiting, diarrhea, constipation, dizziness, abdominal pain, skin rash, fevers, chills, night sweats, weight loss, swollen lymph nodes, body aches, joint swelling, muscle aches, chest  pain, shortness of breath, mood changes.   Objective Blood pressure 98/74, pulse 54, height 5\' 4"  (1.626 m), weight 140 lb (63.504 kg), SpO2 97 %.  General: No apparent distress alert and oriented x3 mood and affect normal, dressed appropriately.  HEENT: Pupils equal, extraocular movements intact  Respiratory: Patient's speak in full sentences and does not appear short of breath patient does have some mild anterior cervical lymphadenopathy with a very enlarged lymph node that is tender to palpation and mobile. Cardiovascular: No lower extremity edema, non tender, no erythema  Skin: Warm dry intact with no signs of infection or rash on extremities or on axial skeleton.  Abdomen: Soft nontender    Neuro: Cranial nerves II through XII are intact, neurovascularly intact in all extremities with 2+ DTRs and 2+ pulses.  Lymph: No lymphadenopathy of posterior or anterior cervical chain or axillae bilaterally.  Gait normal with good balance and coordination.  MSK:  Non tender with full range of motion and good stability and symmetric strength and tone of  elbows, wrist, hip, knee and ankles bilaterally.  Shoulder: left Inspection reveals no abnormalities, atrophy or asymmetry. Palpation is normal with no tenderness over AC joint or bicipital groove. Motion shows the patient does have a decrease in external range of motion to 5, internal range of motion to L3, and forward flexion to 175 Rotator cuff strength normal throughout. signs of impingement with positive Neer and Hawkin's tests, but negative empty can sign. Speeds and Yergason's tests normal. All positive labral pathology with O'Brien's Normal scapular function observed. No painful arc and no drop arm sign. No apprehension sign Lateral shoulder unremarkable.         Impression and Recommendations:     This case required medical decision making of moderate complexity.

## 2014-09-25 NOTE — Patient Instructions (Addendum)
Good to see you Shoulder is doing well Ice is your friend Augmentin 2 times a day for 10 days Call me if you change mind on PT Continue the exercises.  See me again in 2-3 weeks if shoulder is not 95%

## 2014-09-25 NOTE — Assessment & Plan Note (Signed)
Patient is improving after the injection. We discussed the possibility of a repeat injection for the frozen shoulder which patient declined. We also discussed the possibility of formal physical therapy which patient declined. Patient will call if she changes her mind. Discuss continuing the home exercises, icing protocol, as well as the natural supplementations. Patient will come back and see me again in 3-4 weeks.

## 2014-10-11 ENCOUNTER — Ambulatory Visit: Payer: BC Managed Care – PPO | Admitting: Family Medicine

## 2014-11-08 ENCOUNTER — Encounter: Payer: Self-pay | Admitting: Internal Medicine

## 2014-12-28 ENCOUNTER — Encounter: Payer: Self-pay | Admitting: Internal Medicine

## 2015-01-01 ENCOUNTER — Encounter: Payer: Self-pay | Admitting: Internal Medicine

## 2015-01-01 ENCOUNTER — Telehealth: Payer: Self-pay | Admitting: Cardiology

## 2015-01-01 ENCOUNTER — Ambulatory Visit (INDEPENDENT_AMBULATORY_CARE_PROVIDER_SITE_OTHER): Payer: BC Managed Care – PPO

## 2015-01-01 DIAGNOSIS — R002 Palpitations: Secondary | ICD-10-CM

## 2015-01-01 NOTE — Telephone Encounter (Signed)
Patient c/o Palpitations:  High priority if patient c/o lightheadedness and shortness of breath.  1. How long have you been having palpitations? Sat 9/3 few hours  2. Are you currently experiencing lightheadedness and shortness of breath?no- some dizziness.   3. Have you checked your BP and heart rate? (document readings) no BP, P was so fast, could not record- and then would drop into the 40s.   4. Are you experiencing any other symptoms? Extreme fatigue- dizziness

## 2015-01-01 NOTE — Telephone Encounter (Signed)
Pt states she had an episode of palpitations on Sat 12/29/14 that lasted about 3 hours.  Pt states this was associated with some dizziness, pt did not feel like she was going to pass out, she did have some tingling in her lips. Pt states her daughter was upset about something on Friday, pt states palpitations occurred after helped her daughter with her problem.   Pt states she had decreased Toprol XL to 1-2 times a week prn, she did take 1/2 of a 50mg  tablet on Saturday.  Pt denies any palpitations at the present time, she has had some palpitations since Saturday, they have been very short lasting.  Pt states she does still have some tingling in her lips and feels tired since palpitations on Saturday, denies other symptoms at this time.

## 2015-01-01 NOTE — Telephone Encounter (Signed)
error 

## 2015-01-01 NOTE — Telephone Encounter (Signed)
Toprol XL dose is 1/2 of a 25mg  tablet prn not 1/2 of a 50mg  tablet prn.

## 2015-01-01 NOTE — Telephone Encounter (Signed)
Reviewed with Richardson Dopp, PA,c-per Nicki Reaper  Order 24 hour holter monitor, follow up appt this week.   Pt advised appt scheduled 01/03/15 9AM with flex APP, 24 holter monitor ordered.

## 2015-01-02 ENCOUNTER — Encounter: Payer: Self-pay | Admitting: *Deleted

## 2015-01-03 ENCOUNTER — Ambulatory Visit: Payer: BC Managed Care – PPO | Admitting: Cardiology

## 2015-01-04 ENCOUNTER — Ambulatory Visit (INDEPENDENT_AMBULATORY_CARE_PROVIDER_SITE_OTHER): Payer: BC Managed Care – PPO | Admitting: Cardiology

## 2015-01-04 ENCOUNTER — Encounter: Payer: Self-pay | Admitting: Cardiology

## 2015-01-04 VITALS — BP 110/78 | HR 57 | Ht 64.0 in | Wt 142.8 lb

## 2015-01-04 DIAGNOSIS — R002 Palpitations: Secondary | ICD-10-CM

## 2015-01-04 DIAGNOSIS — I471 Supraventricular tachycardia: Secondary | ICD-10-CM | POA: Diagnosis not present

## 2015-01-04 DIAGNOSIS — R001 Bradycardia, unspecified: Secondary | ICD-10-CM | POA: Diagnosis not present

## 2015-01-04 LAB — CBC WITH DIFFERENTIAL/PLATELET
BASOS ABS: 0 10*3/uL (ref 0.0–0.1)
Basophils Relative: 0.6 % (ref 0.0–3.0)
Eosinophils Absolute: 0.1 10*3/uL (ref 0.0–0.7)
Eosinophils Relative: 1.7 % (ref 0.0–5.0)
HCT: 39.6 % (ref 36.0–46.0)
Hemoglobin: 13.4 g/dL (ref 12.0–15.0)
LYMPHS ABS: 1.8 10*3/uL (ref 0.7–4.0)
Lymphocytes Relative: 33.4 % (ref 12.0–46.0)
MCHC: 33.9 g/dL (ref 30.0–36.0)
MCV: 93.3 fl (ref 78.0–100.0)
MONO ABS: 0.4 10*3/uL (ref 0.1–1.0)
Monocytes Relative: 8.4 % (ref 3.0–12.0)
NEUTROS ABS: 2.9 10*3/uL (ref 1.4–7.7)
NEUTROS PCT: 55.9 % (ref 43.0–77.0)
PLATELETS: 224 10*3/uL (ref 150.0–400.0)
RBC: 4.24 Mil/uL (ref 3.87–5.11)
RDW: 13.1 % (ref 11.5–15.5)
WBC: 5.3 10*3/uL (ref 4.0–10.5)

## 2015-01-04 LAB — BASIC METABOLIC PANEL
BUN: 14 mg/dL (ref 6–23)
CO2: 32 mEq/L (ref 19–32)
Calcium: 9 mg/dL (ref 8.4–10.5)
Chloride: 103 mEq/L (ref 96–112)
Creatinine, Ser: 0.69 mg/dL (ref 0.40–1.20)
GFR: 95.22 mL/min (ref >60.00)
GLUCOSE: 80 mg/dL (ref 70–99)
Potassium: 3.9 mEq/L (ref 3.5–5.1)
SODIUM: 141 meq/L (ref 135–145)

## 2015-01-04 LAB — TSH: TSH: 3.62 u[IU]/mL (ref 0.35–4.50)

## 2015-01-04 NOTE — Progress Notes (Signed)
Cardiology Office Note   Date:  01/04/2015   ID:  CHEROLYN BEHRLE, DOB 05/08/1965, MRN [id number]  PCP:  Crecencio Mc, MD  Cardiologist:  Dr. Aundra Dubin    Chief Complaint  Patient presents with  . Tachycardia    long episode on Sunday      History of Present Illness: Alexis Bradley is a 49 y.o. female who presents for 3 hour event of palpitations   history of fatigue, short runs of atrial tachycardia, and PVCs returns for cardiology followup. Cardiac catheterization in 2009 showed minimal luminal irregularities and normal EF. Patient was seen again in 12/11 for palpitations. I had her do a holter monitor in 1/12, which showed occasional PVCs and short runs of atrial tachycardia (up to 9 beats). Echo at that time showed normal EF with mild to moderate aortic insufficiency and mild mitral regurgitation.  She had been on toprol but developed HR at times in the 40s and on holter she also had  Probable atrial tach. She was then on toproll 12.5  Past Medical History  Diagnosis Date  . Palpitations     Holder 2009 with PVCs and bigeminy. Holter 1/12 with PVCs and short runs of atrial tachycardia (up to 9 beats)  . S/P left heart catheterization by percutaneous approach 2009    Apparently done after patient passed out during a treadmill stress test. Luminal irregularities only in coronaries. EF 60%  . Fatigue   . 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 GERD (gastroesophageal reflux disease) Feb 2011    esophagitis by EGD Feb 2011  . Gastric polyps March 2012    Endoscopy  . Hiatal hernia   . Poor circulation of extremity     Bil legs  . Irregular heartbeat   . Varicose veins     Past Surgical History  Procedure Laterality Date  . Cardiac catheterization  2009    Left heart cath  . Cholecystectomy    .  Vesicovaginal fistula closure w/ tah    . Cesarean section    . Abdominal hysterectomy    . Right gsv elas with 10-20 stab phlebectomies right calf  05-25-11     Current Outpatient Prescriptions  Medication Sig Dispense Refill  . Ascorbic Acid (VITAMIN C) 1000 MG tablet Take 1,000 mg by mouth daily.    . diclofenac (VOLTAREN) 75 MG EC tablet Take 1 tablet (75 mg total) by mouth 2 (two) times daily. 60 tablet 1  . MAGNESIUM PO Take 1 tablet by mouth daily.      . metoprolol succinate (TOPROL-XL) 25 MG 24 hr tablet Take 12.5 mg by mouth. Take 3 times a WEEK    . Multiple Vitamin (MULTIVITAMIN) tablet Take 1 tablet by mouth daily.    . traMADol (ULTRAM) 50 MG tablet Take 1 tablet (50 mg total) by mouth at bedtime. 30 tablet 3   No current facility-administered medications for this visit.    Allergies:   Tape    Social History:  The patient  reports that she has never smoked. She has never used smokeless tobacco. She reports that she drinks about 1.5 oz of alcohol per week. She reports that she does not use illicit drugs.   Family History:  The patient's family history includes Breast cancer in her maternal grandmother, paternal grandmother, sister, and sister; Heart disease  in her paternal grandfather; Stroke in her other. There is no history of Coronary artery disease.    ROS:  General:no colds or fevers, no weight changes Skin:no rashes or ulcers HEENT:no blurred vision, no congestion CV:see HPI PUL:see HPI GI:no diarrhea constipation or melena, no indigestion GU:no hematuria, no dysuria MS:no joint pain, no claudication Neuro:no syncope, no lightheadedness Endo:no diabetes, no thyroid disease 1 cup of caffeine a day.   Wt Readings from Last 3 Encounters:  01/04/15 142 lb 12 oz (64.751 kg)  09/25/14 140 lb (63.504 kg)  09/03/14 137 lb (62.143 kg)     PHYSICAL EXAM: VS:  BP 110/78 mmHg  Pulse 57  Ht 5\' 4"  (1.626 m)  Wt 142 lb 12 oz (64.751 kg)  BMI 24.49 kg/m2 , BMI  Body mass index is 24.49 kg/(m^2). General:Pleasant affect, NAD Skin:Warm and dry, brisk capillary refill HEENT:normocephalic, sclera clear, mucus membranes moist Neck:supple, no JVD, no bruits  Heart:S1S2 RRR without murmur, gallup, rub or click Lungs:clear without rales, rhonchi, or wheezes ZOX:WRUE, non tender, + BS, do not palpate liver spleen or masses Ext:no lower ext edema, 2+ pedal pulses, 2+ radial pulses Neuro:alert and oriented, MAE, follows commands, + facial symmetry    EKG:  EKG is ordered today. The ekg ordered today demonstrates SB at 54 no acute changes from 2014.    Recent Labs: No results found for requested labs within last 365 days.    Lipid Panel    Component Value Date/Time   CHOL 190 04/27/2011 0914   TRIG 103.0 04/27/2011 0914   HDL 59.50 04/27/2011 0914   CHOLHDL 3 04/27/2011 0914   VLDL 20.6 04/27/2011 0914   LDLCALC 110* 04/27/2011 0914       Other studies Reviewed: Additional studies/ records that were reviewed today include: . Echo:  2013 Study Conclusions  - Left ventricle: The cavity size was normal. Wall thickness was normal. Systolic function was normal. The estimated ejection fraction was in the range of 55% to 65%. Wall motion was normal; there were no regional wall motion abnormalities. Left ventricular diastolic function parameters were normal. - Aortic valve: Mild regurgitation. - Mitral valve: Mild regurgitation. - Left atrium: The atrium was normal in size. - Right ventricle: Systolic function was normal. - Tricuspid valve: Mild regurgitation. - Pulmonary arteries: Systolic pressure was elevated consistent with mild pulmonary HTN.  ASSESSMENT AND PLAN:  1  Atrial tachycardia: Patient has had documented short runs of atrial tachycardia.but most recent episode lasted 3 hours. She was dizzy with this.   No chest pain or SOB.  She wore a holter monitor but do not have results back yet.  She has episodes daily but  brief episodes.  Current medicines are reviewed with the patient today.  She has been taking toprol 12.5 twice a week, with daily her HR was 40s and occ to 30s.  We briefly discussed pacemaker.  Once i have holter results I will review with Dr. Aundra Dubin but for now will increase her toprol 12.5 to three times a week, though this may cause increased bradycardia.   2. Bradycardia: Mild sinus bradycardia with HR in the upper 40s occasionally though today she states it did drop into the 30s.  . She occasionally felt lightheaded when HR is low but has not ever passed out. she is taking the toprol 12.5 twice a week - for now will increase to three times a week but she may need EP eval in future.  I will send note to  Dr. Aundra Dubin to get his input.  Will check labs CBC, BMP and TSH.   The following changes have been made:  See above Labs/ tests ordered today include:see above  Disposition:   FU:  see above  Signed, Isaiah Serge, NP  01/04/2015 11:52 AM    [facility] [place], [place], [place], [place] Phone: [phone]; Fax: [phone]

## 2015-01-04 NOTE — Patient Instructions (Addendum)
Medication Instructions:  Your physician has recommended you make the following change in your medication:  1   Start taking the Metoprolol 25 mg THREE times a week.   Labwork: TODAY:  TSH                 BMP                 CBC  Testing/Procedures: None ordered  Follow-Up: Will be determined upon the results of the heart monitor report.  Mickel Baas will discuss with Dr. Aundra Dubin and the office will give you a call and go over results and return appointment information.

## 2015-01-07 ENCOUNTER — Telehealth: Payer: Self-pay

## 2015-01-07 NOTE — Telephone Encounter (Signed)
Called patient about results of holter monitor. Per Cecilie Kicks NP, Monitor showed brief episodes of tachycardia and would like her to take Toprol 12.5 mg by mouth daily. Patient needs follow up appointment with APP in 4 weeks will send message to schedulers. Patient will keep a brief journal of fast HR or slow HR. Patient verbalized understanding and will call the office when any other concerns.

## 2015-01-07 NOTE — Telephone Encounter (Signed)
-----   Message from Isaiah Serge, NP sent at 01/06/2015  4:09 PM EDT ----- Please let pt know on holter she has brief episodes of tachycardia, I had Dr. Aundra Dubin review and he would like her to take 12.5 on toprol daily.  She will need follow up in 4 weeks with APP.  Ask her to keep a brief journal of fast HR or slow HR.  Thanks.

## 2015-01-10 ENCOUNTER — Telehealth: Payer: Self-pay | Admitting: Cardiology

## 2015-01-10 ENCOUNTER — Encounter: Payer: Self-pay | Admitting: Physician Assistant

## 2015-01-10 NOTE — Telephone Encounter (Signed)
New message     Pt c/o medication issue:  1. Name of Medication:  metoprolol 2. How are you currently taking this medication (dosage and times per day)? 12.5mg  daily-----started this last tues.  (Was taking it 12.5mg  twice a week)  3. Are you having a reaction (difficulty breathing--STAT)? No but really fatigue 4. What is your medication issue? HR is in the 40's since taking medication daily.  Is this normal?

## 2015-01-10 NOTE — Telephone Encounter (Signed)
Would try stopping metoprolol at this point and see how she does without it.

## 2015-01-10 NOTE — Telephone Encounter (Signed)
Patient has also sent Melina Copa PA an e-mail about her concerns.   "Good morning. I am taking the 12.5 mg tablet once each day and have been doing so since Tuesday.  My heartrate is going down to around 44 during the day.  Last night before and after exercising, I was very tired.  I usually jog/walk for about 3.5 miles.  I was only able to walk and not at a very brisk pace. My BP was 94/48 and my heartrate was 54. I have an appointment on Oct. 3 with you but wanted to let you know what is happening now. Thanks for your help.   Alexis Bradley"  Informed patient to hold her Metoprolol in the morning until she hears back from [facility] PA or our office. Will send message to Dr. Aundra Dubin and his nurse.

## 2015-01-11 MED ORDER — DILTIAZEM HCL 30 MG PO TABS: 30.0000 mg | ORAL_TABLET | Freq: Two times a day (BID) | ORAL | Status: DC

## 2015-01-11 NOTE — Telephone Encounter (Signed)
Pt states when she takes Toprol less than daily she has episodes of heart racing and tachycardia. Pt asking if there is another medication she can try that would control tachycardia but not lower resting heart rate and BP so much.  Pt states she is training for a 1/2 marathon, she ran 3 miles yesterday, her maximum heart rate was 66. Pt states she is supposed to run 11 miles tomorrow. Pt asking if it is Ok to continue to train for 1/2 marathon since her maximum heart rate with exercise is low.  Pt advised I will forward to Dr Aundra Dubin for review.

## 2015-01-11 NOTE — Telephone Encounter (Signed)
Left message for patient to call back  

## 2015-01-11 NOTE — Telephone Encounter (Signed)
OK to train for marathon if she is feeling ok with the exercise.  She can try taking diltiazem 30 mg bid instead of the metoprolol.

## 2015-01-11 NOTE — Telephone Encounter (Signed)
Will forward to Anne L RN with Dr McLean  

## 2015-01-14 ENCOUNTER — Encounter: Payer: Self-pay | Admitting: Physician Assistant

## 2015-01-14 MED ORDER — DILTIAZEM HCL ER COATED BEADS 120 MG PO CP24: 120.0000 mg | ORAL_CAPSULE | Freq: Every day | ORAL | Status: DC

## 2015-01-14 NOTE — Telephone Encounter (Signed)
FW: Non-Urgent Medical Question     Larey Dresser, MD    Sent: Mon January 14, 2015 4:15 PM    To: Katrine Coho, RN        Message     BP is running high and HR is not particularly low on the diltiazem, not sure why she felt faint once as her BP/HR are not low. Let's have her transition to more long-acting diltiazem CD 120 mg daily to see if that evens things out. If she continues to be symptomatic, given difficulty with bradycardia, would consider EP referral/?ablation. She had atrial tachycardia on holter.

## 2015-01-14 NOTE — Telephone Encounter (Signed)
Called pt, advised, verbalized understanding.

## 2015-01-14 NOTE — Telephone Encounter (Signed)
   FW: Non-Urgent Medical Question    Larey Dresser, MD    Sent: Mon January 14, 2015 4:15 PM    To: Katrine Coho, RN        Message     BP is running high and HR is not particularly low on the diltiazem, not sure why she felt faint once as her BP/HR are not low. Let's have her transition to more long-acting diltiazem CD 120 mg daily to see if that evens things out. If she continues to be symptomatic, given difficulty with bradycardia, would consider EP referral/?ablation. She had atrial tachycardia on holter.

## 2015-01-27 ENCOUNTER — Encounter: Payer: Self-pay | Admitting: Physician Assistant

## 2015-01-27 DIAGNOSIS — I491 Atrial premature depolarization: Secondary | ICD-10-CM | POA: Insufficient documentation

## 2015-01-27 DIAGNOSIS — I471 Supraventricular tachycardia: Secondary | ICD-10-CM | POA: Insufficient documentation

## 2015-01-27 DIAGNOSIS — I493 Ventricular premature depolarization: Secondary | ICD-10-CM | POA: Insufficient documentation

## 2015-01-27 DIAGNOSIS — R001 Bradycardia, unspecified: Secondary | ICD-10-CM | POA: Insufficient documentation

## 2015-01-27 NOTE — Progress Notes (Signed)
Cardiology Office Note Date:  01/28/2015  Patient ID:  Alexis Bradley, Alexis Bradley 10-Jul-1965, MRN [id number] PCP:  Crecencio Mc, MD  Cardiologist:  Dr. Aundra Dubin   Chief Complaint: f/u palpitations  History of Present Illness: Alexis Bradley is a 49 y.o. female with history of palpitations (atrial tachycardia, PVCs), mild MR/AI/TR by echo 2013, GERD, hiatal hernia, varicose veins who presents for follow-up. Per chart she has history of cath 11/2007: minor luminal irregularities, EF 60%. Holter monitor done in August 2009 showded frequent isolated PVCs and bigeminy. Holter monitor in 1/12 which showed occasional PVCs and short runs of atrial tachycardia (up to 9 beats). Prior echo in 2012 showed moderate AI, but more recent echo in 05/2011 showed EF 22-29%, normal diastolic function, mild MR/AI/TR, mild pulm HTN. She was previously on Toprol but developed HR in the 40s per notes. She was seen by Cecilie Kicks, NP on 01/04/2015 for a 3-hour episode of palpitations. Labs were normal. Holter monitor earlier this month showed NSR with rare PACs, rare short runs of narrow complex tachycardia c/w atrial tachycardia, minimal HR 48bpm. Initially the plan was to continue low dose BB. She wrote in by email reporting her HR dropping into the 40s again. Dr. Aundra Dubin stopped metoprolol and changed her to diltiazem 30mg  BID, later transitioning to 120mg  daily.  Since being on the diltiazem she has noticed her HR does not dip as far down as it used to. She notices it runs mostly in the 50s, occasionally 60s. She still has brief episodes of tachypalpitations, however. She has not had any long sustained episodes like that which prompted eval earlier this month. She feels generally fatigued. No chest pain or dyspnea. She completed a 1/2 marathon this weekend without any functional limitation.    Past Medical History  Diagnosis Date  . Palpitations     a. Holter 2009 with PVCs and bigeminy. b. Holter 1/12 with PVCs and short runs  of atrial tachycardia (up to 9 beats). c. Holter 12/2014: rare PACs, rare short runs of narrow complex tachycardia c/w atrial tachycardia.  . S/P left heart catheterization by percutaneous approach 2009    a. cath 11/2007: minor luminal irregularities, EF 60%.  . Fatigue   . 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 GERD (gastroesophageal reflux disease) Feb 2011    esophagitis by EGD Feb 2011  . Gastric polyps March 2012    Endoscopy  . Hiatal hernia   . 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 Varicose veins   . PAT (paroxysmal atrial tachycardia) ([place])   . Premature atrial contractions   . PVC's (premature ventricular contractions)   . Sinus bradycardia     a. HR occasionally 40s per patient, rare dips to 30s.    Past Surgical History  Procedure Laterality Date  . Cardiac catheterization  2009    Left heart cath  . Cholecystectomy    . Vesicovaginal fistula closure w/ tah    . Cesarean section    . Abdominal hysterectomy    . Right gsv elas with 10-20 stab phlebectomies right calf  05-25-11    Current Outpatient Prescriptions  Medication Sig Dispense Refill  . Ascorbic Acid (VITAMIN C) 1000 MG tablet Take 1,000 mg by mouth daily.    Marland Kitchen diltiazem (CARDIZEM CD) 120 MG 24 hr  capsule Take 1 capsule (120 mg total) by mouth daily. 30 capsule 3  . MAGNESIUM PO Take 1 tablet by mouth daily.      . Multiple Vitamin (MULTIVITAMIN) tablet Take 1 tablet by mouth daily.     No current facility-administered medications for this visit.    Allergies:   Tape   Social History:  The patient  reports that she has never smoked. She has never used smokeless tobacco. She reports that she drinks about 1.5 oz of alcohol per week. She reports that she does not use illicit drugs.   Family History:  The patient's family history  includes Breast cancer in her maternal grandmother, paternal grandmother, sister, and sister; Heart disease in her paternal grandfather; Stroke in her other. There is no history of Coronary artery disease.  ROS:  Please see the history of present illness.  All other systems are reviewed and otherwise negative.   PHYSICAL EXAM:  VS:  BP 130/78 mmHg  Pulse 65  Ht 5\' 4"  (1.626 m)  Wt 144 lb 12.8 oz (65.681 kg)  BMI 24.84 kg/m2  SpO2 99% BMI: Body mass index is 24.84 kg/(m^2). Well nourished, well developed WF, in no acute distress HEENT: normocephalic, atraumatic Neck: no JVD, carotid bruits or masses Cardiac:  normal S1, S2; RRR; +soft SEM. No rubs or gallops Lungs:  clear to auscultation bilaterally, no wheezing, rhonchi or rales Abd: soft, nontender, no hepatomegaly, + BS MS: no deformity or atrophy Ext: no edema Skin: warm and dry, no rash Neuro:  moves all extremities spontaneously, no focal abnormalities noted, follows commands Psych: euthymic mood, full affect  EKG:  Done today shows sinus bradycardia 58bpm, no acute ST-T changes  Recent Labs: 01/04/2015: BUN 14; Creatinine, Ser 0.69; Hemoglobin 13.4; Platelets 224.0; Potassium 3.9; Sodium 141; TSH 3.62  No results found for requested labs within last 365 days.   CrCl cannot be calculated (Patient has no serum creatinine result on file.).   Wt Readings from Last 3 Encounters:  01/28/15 144 lb 12.8 oz (65.681 kg)  01/04/15 142 lb 12 oz (64.751 kg)  09/25/14 140 lb (63.504 kg)     Other studies reviewed: Additional studies/records reviewed today include: summarized above  ASSESSMENT AND PLAN:  1. Paroxysmal atrial tach, PACs, PVCs - it sounds like symptoms are better on diltiazem with less bradycardia but she continues to have intermittent tachypalpitations. At this point I think it would be worthwhile to get EP input regarding her condition to weigh in whether she would benefit from more invasive procedure such as ablation  versus antiarrhythmic therapy. Her sinus bradycardia precludes aggressive titration of AVN blocking agents. 2. Sinus bradycardia - followed in the context above. 3. 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 Given complains of general fatigue and more persistent arrhythmias, will update her echo.  Disposition: F/u with EP as above.  Current medicines are reviewed at length with the patient today.  The patient did not have any concerns regarding medicines.  Raechel Ache PA-C 01/28/2015 9:21 AM     [place] [place] [place] [address] [phone] (office)  [phone] (fax)

## 2015-01-28 ENCOUNTER — Ambulatory Visit (INDEPENDENT_AMBULATORY_CARE_PROVIDER_SITE_OTHER): Payer: BC Managed Care – PPO | Admitting: Physician Assistant

## 2015-01-28 ENCOUNTER — Encounter: Payer: Self-pay | Admitting: Physician Assistant

## 2015-01-28 VITALS — BP 130/78 | HR 65 | Ht 64.0 in | Wt 144.8 lb

## 2015-01-28 DIAGNOSIS — I491 Atrial premature depolarization: Secondary | ICD-10-CM | POA: Diagnosis not present

## 2015-01-28 DIAGNOSIS — I493 Ventricular premature depolarization: Secondary | ICD-10-CM

## 2015-01-28 DIAGNOSIS — R001 Bradycardia, unspecified: Secondary | ICD-10-CM | POA: Diagnosis not present

## 2015-01-28 DIAGNOSIS — I38 Endocarditis, valve unspecified: Secondary | ICD-10-CM

## 2015-01-28 DIAGNOSIS — I471 Supraventricular tachycardia: Secondary | ICD-10-CM | POA: Diagnosis not present

## 2015-01-28 NOTE — Patient Instructions (Addendum)
Medication Instructions:  Your physician recommends that you continue on your current medications as directed. Please refer to the Current Medication list given to you today.   Labwork: None ordered  Testing/Procedures: Your physician has requested that you have an echocardiogram. Echocardiography is a painless test that uses sound waves to create images of your heart. It provides your doctor with information about the size and shape of your heart and how well your heart's chambers and valves are working. This procedure takes approximately one hour. There are no restrictions for this procedure.    Follow-Up: Your physician recommends that you schedule a follow-up appointment with DR. KLEIN IN [place] OR [address] [facility] 1ST AVAILABLE FOR SVT   Any Other Special Instructions Will Be Listed Below (If Applicable).  Echocardiogram An echocardiogram, or echocardiography, uses sound waves (ultrasound) to produce an image of your heart. The echocardiogram is simple, painless, obtained within a short period of time, and offers valuable information to your health care provider. The images from an echocardiogram can provide information such as:  Evidence of coronary artery disease (CAD).  Heart size.  Heart muscle function.  Heart valve function.  Aneurysm detection.  Evidence of a past heart attack.  Fluid buildup around the heart.  Heart muscle thickening.  Assess heart valve function. LET [facility] CARE PROVIDER KNOW ABOUT:  Any allergies you have.  All medicines you are taking, including vitamins, herbs, eye drops, creams, and over-the-counter medicines.  Previous problems you or members of your family have had with the use of anesthetics.  Any blood disorders you have.  Previous surgeries you have had.  Medical conditions you have.  Possibility of pregnancy, if this applies. BEFORE THE PROCEDURE  No special preparation is needed. Eat and drink normally.   PROCEDURE   In order to produce an image of your heart, gel will be applied to your chest and a wand-like tool (transducer) will be moved over your chest. The gel will help transmit the sound waves from the transducer. The sound waves will harmlessly bounce off your heart to allow the heart images to be captured in real-time motion. These images will then be recorded.  You may need an IV to receive a medicine that improves the quality of the pictures. AFTER THE PROCEDURE You may return to your normal schedule including diet, activities, and medicines, unless your health care provider tells you otherwise. Document Released: 04/10/2000 Document Revised: 08/28/2013 Document Reviewed: 12/19/2012 [facility] Patient Information 2015 [place], [place]. This information is not intended to replace advice given to you by your health care provider. Make sure you discuss any questions you have with your health care provider.

## 2015-01-29 ENCOUNTER — Ambulatory Visit (INDEPENDENT_AMBULATORY_CARE_PROVIDER_SITE_OTHER): Payer: BC Managed Care – PPO

## 2015-01-29 ENCOUNTER — Other Ambulatory Visit: Payer: Self-pay

## 2015-01-29 DIAGNOSIS — I471 Supraventricular tachycardia: Secondary | ICD-10-CM

## 2015-02-28 ENCOUNTER — Ambulatory Visit (INDEPENDENT_AMBULATORY_CARE_PROVIDER_SITE_OTHER): Payer: BC Managed Care – PPO | Admitting: Internal Medicine

## 2015-02-28 ENCOUNTER — Encounter: Payer: Self-pay | Admitting: Internal Medicine

## 2015-02-28 VITALS — BP 120/68 | HR 59 | Ht 64.0 in | Wt 143.0 lb

## 2015-02-28 DIAGNOSIS — R001 Bradycardia, unspecified: Secondary | ICD-10-CM

## 2015-02-28 DIAGNOSIS — I351 Nonrheumatic aortic (valve) insufficiency: Secondary | ICD-10-CM | POA: Diagnosis not present

## 2015-02-28 NOTE — Progress Notes (Signed)
ELECTROPHYSIOLOGY CONSULT NOTE  Patient ID: Alexis Bradley, MRN: [id number], DOB/AGE: [birth year] 51 y.o. Admit date: (Not on file) Date of Consult: 02/28/2015  Primary Physician: Crecencio Mc, MD Primary Cardiologist: DM  Chief Complaint: palps annd fatigue   HPI Alexis Bradley is a 49 y.o. female  Seen for symptomatic PACs and PVCs with some nonsustained atrial tachycardia.  She isquite fit having just run a half marathon.  Because of symptomatic palpitations in the past she been treated with beta blockers which he has not tolerated because of heart rates in the 40s. She has tolerated low-dose calcium blockers were easily currently taking 120 Cardizem daily.  Her other major complaint is fatigue. It is been her impression that the palpitations contributing to this. The tachypalpitations are also associated with some degree of lightheadedness.  She notes that she doesn't sleep well. She awakens early in the morning and cannot get back to sleep. She tells me that her father committed suicide in April and this is had a huge impact on her and her family.  Echocardiogram 2013 demonstrated normal LV function mild pulmonary hypertension and mild MR and TR.   Her past history is notable for GE RD, hiatal hernia, varicose veins who presents for follow-up. Per chart she has history of cath 11/2007: minor luminal irregularities,   EF 60% ,     Much Past Medical History  Diagnosis Date  . Palpitations     a. Holter 2009 with PVCs and bigeminy. b. Holter 1/12 with PVCs and short runs of atrial tachycardia (up to 9 beats). c. Holter 12/2014: rare PACs, rare short runs of narrow complex tachycardia c/w atrial tachycardia.  . S/P left heart catheterization by percutaneous approach 2009    a. cath 11/2007: minor luminal irregularities, EF 60%.  . Fatigue   . Echocardiogram abnormal 05/2010    EF 65-70%; mild to moderate aortic insufficiency (trileaflet aortic valve), mild mitral  regurgitation, PA systolic pressure 35 mmHg, ascending aorta dilated to 3.7 cm  . GERD (gastroesophageal reflux disease) Feb 2011    esophagitis by EGD Feb 2011  . Gastric polyps March 2012    Endoscopy  . Hiatal hernia   . 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 Varicose veins   . PAT (paroxysmal atrial tachycardia) ([place])   . Premature atrial contractions   . PVC's (premature ventricular contractions)   . Sinus bradycardia     a. HR occasionally 40s per patient, rare dips to 30s.      Surgical History:  Past Surgical History  Procedure Laterality Date  . Cardiac catheterization  2009    Left heart cath  . Cholecystectomy    . Vesicovaginal fistula closure w/ tah    . Cesarean section    . Abdominal hysterectomy    . Right gsv elas with 10-20 stab phlebectomies right calf  05-25-11     Home Meds: Prior to Admission medications   Medication Sig Start Date End Date Taking? Authorizing Provider  Ascorbic Acid (VITAMIN C) 1000 MG tablet Take 1,000 mg by mouth daily.   Yes Historical Provider, MD  diltiazem (CARDIZEM CD) 120 MG 24 hr capsule Take 1 capsule (120 mg total) by mouth daily. 01/14/15  Yes Larey Dresser, MD  MAGNESIUM PO Take 1 tablet by mouth daily.     Yes Historical Provider, MD  Multiple Vitamin (MULTIVITAMIN) tablet Take 1 tablet by mouth daily.   Yes Historical  Provider, MD    Allergies:  Allergies  Allergen Reactions  . Tape Hives and Other (See Comments)    redness    Social History   Social History  . Marital Status: Married    Spouse Name: N/A  . Number of Children: N/A  . Years of Education: N/A   Occupational History  . 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 Smoking status: Never Smoker   . Smokeless tobacco: Never Used  . Alcohol Use: 1.5 oz/week    3 drink(s) per week     Comment: Rarely  . Drug Use: No  . Sexual Activity: Not on file    Other Topics Concern  . Not on file   Social History Narrative   Married     Family History  Problem Relation Age of Onset  . Coronary artery disease Neg Hx     Premature  . Stroke Other     Grandfather  . Breast cancer Sister   . Breast cancer Sister   . Breast cancer Maternal Grandmother   . Breast cancer Paternal Grandmother   . Heart disease Paternal Grandfather      ROS:  Please see the history of present illness.      All other systems reviewed and negative.    Physical Exam:   Blood pressure 120/68, pulse 59, height 5\' 4"  (1.626 m), weight 143 lb (64.864 kg). General: Well developed, well nourished female in no acute distress. Head: Normocephalic, atraumatic, sclera non-icteric, no xanthomas, nares are without discharge. EENT: normal  Lymph Nodes:  none Neck: Negative for carotid bruits. JVD not elevated. Back:without scoliosis kyphosis*  Lungs: Clear bilaterally to auscultation without wheezes, rales, or rhonchi. Breathing is unlabored. Heart: RRR with S1 S2. No  murmur . No rubs, or gallops appreciated. Abdomen: Soft, non-tender, non-distended with normoactive bowel sounds. No hepatomegaly. No rebound/guarding. No obvious abdominal masses. Msk:  Strength and tone appear normal for age. Extremities: No clubbing or cyanosis. No*  edema.  Distal pedal pulses are 2+ and equal bilaterally. Skin: Warm and Dry Neuro: Alert and oriented X 3. CN III-XII intact Grossly normal sensory and motor function . Psych:  Responds to questions appropriately with a normal affect.      Labs: Cardiac Enzymes No results for input(s): CKTOTAL, CKMB, TROPONINI in the last 72 hours. CBC Lab Results  Component Value Date   WBC 5.3 01/04/2015   HGB 13.4 01/04/2015   HCT 39.6 01/04/2015   MCV 93.3 01/04/2015   PLT 224.0 01/04/2015   PROTIME: No results for input(s): LABPROT, INR in the last 72 hours. Chemistry No results for input(s): NA, K, CL, CO2, BUN, CREATININE, CALCIUM,  PROT, BILITOT, ALKPHOS, ALT, AST, GLUCOSE in the last 168 hours.  Invalid input(s): LABALBU Lipids Lab Results  Component Value Date   CHOL 190 04/27/2011   HDL 59.50 04/27/2011   LDLCALC 110* 04/27/2011   TRIG 103.0 04/27/2011   BNP PRO B NATRIURETIC PEPTIDE (BNP)  Date/Time Value Ref Range Status  05/19/2010 12:00 AM 49.8 0.0-100.0 pg/mL Final  12/07/2007 09:46 PM 125.0* 0.0-100.0 pg/mL Final   Thyroid Function Tests: No results for input(s): TSH, T4TOTAL, T3FREE, THYROIDAB in the last 72 hours.  Invalid input(s): FREET3 Miscellaneous No results found for: DDIMER  Radiology/Studies:  No results found.  EKG: Sinus rhythm at 59 Intervals 04/04/42   Holter monitor was reviewed with the patient. Showed nonsustained  atrial tachycardia longest 20 beats fastest 156 infrequent PVCs less than 0.3%  Assessment and Plan:   PVCs  PACs/atrial tachycardia   Fatigue   There is a strong component of fatigue which seems to be out of proportion to her ectopy. The story of her father's suicide at her inability to sleep suggests that there may be a neuropsychiatric component to this including depression. I've encouraged her to consider this and discuss this with her primary care physician. It may be appropriate for empiric therapy. Rather than an alternative therapies for her ectopy, it seems reasonable to try to assess the former issue initially. She is agreeable to this.  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  Strategies for dressing arrhythmia would include the use of an ISH beta blocker to try to minimize the bradycardia which is been problematic, up titration of her calcium blocker or the use of flecainide with his potential for proarrhythmia. We have reviewed the above.    Virl Axe

## 2015-02-28 NOTE — Patient Instructions (Signed)
Medication Instructions: - no changes  Labwork: - none  Procedures/Testing: - none  Follow-Up: - Your physician recommends that you schedule a follow-up appointment in: 3 months with Dr. Caryl Comes in the [place] office.  Any Additional Special Instructions Will Be Listed Below (If Applicable).

## 2015-03-01 ENCOUNTER — Institutional Professional Consult (permissible substitution): Payer: BC Managed Care – PPO | Admitting: Cardiovascular Disease

## 2015-04-08 ENCOUNTER — Encounter: Payer: Self-pay | Admitting: Family Medicine

## 2015-04-08 ENCOUNTER — Ambulatory Visit (INDEPENDENT_AMBULATORY_CARE_PROVIDER_SITE_OTHER): Payer: BC Managed Care – PPO

## 2015-04-08 ENCOUNTER — Ambulatory Visit (INDEPENDENT_AMBULATORY_CARE_PROVIDER_SITE_OTHER): Payer: BC Managed Care – PPO | Admitting: Family Medicine

## 2015-04-08 VITALS — BP 142/82 | HR 58 | Ht 64.0 in | Wt 145.0 lb

## 2015-04-08 DIAGNOSIS — M25512 Pain in left shoulder: Secondary | ICD-10-CM | POA: Diagnosis not present

## 2015-04-08 DIAGNOSIS — M7552 Bursitis of left shoulder: Secondary | ICD-10-CM

## 2015-04-08 NOTE — Progress Notes (Signed)
Pre visit review using our clinic review tool, if applicable. No additional management support is needed unless otherwise documented below in the visit note. 

## 2015-04-08 NOTE — Patient Instructions (Signed)
Great to see you Alexis Bradley is your friend Ice 20 minutes 2 times daily. Usually after activity and before bed. Exercises 3-5 times a week.  pennsaid pinkie amount topically 2 times daily as needed.  Have a great trip See me again in 3-4 weeks.  Happy holidays!

## 2015-04-08 NOTE — Assessment & Plan Note (Signed)
Patient given injection today and tolerated the procedure very well. Patient did have increasing range of motion and strength immediately. We discussed icing regimen, home exercises. We discussed formal physical therapy which patient declined. Patient is going to try to do the conservative therapy and come back and see me again in 3-4 weeks. If continuing have trouble I do think that patient will need advance imaging to rule out other intra-articular pathology.Marland Kitchen  Spent  25 minutes with patient face-to-face and had greater than 50% of counseling including as described above in assessment and plan.

## 2015-04-08 NOTE — Progress Notes (Signed)
[facility] [address] [place], [place] [address] Phone: [phone] Subjective:     CC: Left shoulder pain follow up  [id number] Alexis Bradley is a 49 y.o. female coming in with complaint of left shoulder pain. Patient was found to have subacromial bursitis, posterior labral tear, as well as frozen shoulder. Patient was last seen 7 months ago. Patient was doing very well and started having worsening pain again. Seems to be on the posterior aspect of the shoulder. Denies any numbness. Denies any tingling down the arm. States though that the pain is gotten so bad that she has got some mild weakness. Patient states that it is starting to wake her up again at night. Patient stopped doing the home exercises on a regular basis.    Past Medical History  Diagnosis Date  . Palpitations     a. Holter 2009 with PVCs and bigeminy. b. Holter 1/12 with PVCs and short runs of atrial tachycardia (up to 9 beats). c. Holter 12/2014: rare PACs, rare short runs of narrow complex tachycardia c/w atrial tachycardia.  . S/P left heart catheterization by percutaneous approach 2009    a. cath 11/2007: minor luminal irregularities, EF 60%.  . Fatigue   . 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 GERD (gastroesophageal reflux disease) Feb 2011    esophagitis by EGD Feb 2011  . Gastric polyps March 2012    Endoscopy  . Hiatal hernia   . 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 Varicose veins   . PAT (paroxysmal atrial tachycardia) ([place])   . Premature atrial contractions   . PVC's (premature ventricular contractions)   . Sinus bradycardia     a. HR occasionally 40s per patient, rare dips to 30s.   Past Surgical History  Procedure Laterality Date  . Cardiac catheterization  2009    Left heart cath    . Cholecystectomy    . Vesicovaginal fistula closure w/ tah    . Cesarean section    . Abdominal hysterectomy    . Right gsv elas with 10-20 stab phlebectomies right calf  05-25-11   Social History   Social History  . Marital Status: Married    Spouse Name: N/A  . Number of Children: N/A  . Years of Education: N/A   Occupational History  . 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 Smoking status: Never Smoker   . Smokeless tobacco: Never Used  . Alcohol Use: 1.5 oz/week    3 drink(s) per week     Comment: Rarely  . Drug Use: No  . Sexual Activity: Not on file   Other Topics Concern  . Not on file   Social History Narrative   Married   Allergies  Allergen Reactions  . Tape Hives and Other (See Comments)    redness   Family History  Problem Relation Age of Onset  . Coronary artery disease Neg Hx     Premature  . Stroke Other     Grandfather  . Breast cancer Sister   . Breast cancer Sister   . Breast cancer Maternal Grandmother   . Breast cancer Paternal Grandmother   . Heart disease Paternal Grandfather  Past medical history, social, surgical and family history all reviewed in electronic medical record.   Review of Systems: No headache, visual changes, nausea, vomiting, diarrhea, constipation, dizziness, abdominal pain, skin rash, fevers, chills, night sweats, weight loss, swollen lymph nodes, body aches, joint swelling, muscle aches, chest pain, shortness of breath, mood changes.   Objective Blood pressure 142/82, pulse 58, height 5\' 4"  (1.626 m), weight 145 lb (65.772 kg), SpO2 98 %.  General: No apparent distress alert and oriented x3 mood and affect normal, dressed appropriately.  HEENT: Pupils equal, extraocular movements intact  Respiratory: Patient's speak in full sentences and does not appear short of breath patient does have some mild anterior cervical  lymphadenopathy with a very enlarged lymph node that is tender to palpation and mobile. Cardiovascular: No lower extremity edema, non tender, no erythema  Skin: Warm dry intact with no signs of infection or rash on extremities or on axial skeleton.  Abdomen: Soft nontender  Neuro: Cranial nerves II through XII are intact, neurovascularly intact in all extremities with 2+ DTRs and 2+ pulses.  Lymph: No lymphadenopathy of posterior or anterior cervical chain or axillae bilaterally.  Gait normal with good balance and coordination.  MSK:  Non tender with full range of motion and good stability and symmetric strength and tone of  elbows, wrist, hip, knee and ankles bilaterally.  Shoulder: left Inspection reveals no abnormalities, atrophy or asymmetry. Palpation is normal with no tenderness over AC joint or bicipital groove. Motion shows the patient does have a decrease in external range of motion to , internal range of motion toL, and forward flexion to 180 Rotator cuff strength 4 out of 5 signs of impingement with positive Neer and Hawkin's tests, but negative empty can sign. Speeds and Yergason's tests normal. All positive labral pathology with O'Brien's Normal scapular function observed. No painful arc and no drop arm sign. No apprehension sign ContrLateral shoulder unremarkable.    MSK US performed of: left This study was ordered, performed, and interpreted by Charlann Boxer D.O.  Shoulder:   Supraspinatus:  Appears normal on long and transverse views, Bursal bulge seen with shoulder abduction on impingement view. Infraspinatus:  Appears normal on long and transverse views. Significant increase in Doppler flow Subscapularis:  Appears normal on long and transverse views. Positive bursa Teres Minor:  Appears normal on long and transverse views. AC joint:  Capsule undistended, no geyser sign. Glenohumeral Joint:  Appears normal without effusion. Glenoid Labrum:  Intact without visualized  tears. Biceps Tendon:  Appears normal on long and transverse views, no fraying of tendon, tendon located in intertubercular groove, no subluxation with shoulder internal or external rotation.  Impression: Subacromial bursitis  Procedure: Real-time Ultrasound Guided Injection of left glenohumeral joint Device: GE Logiq E  Ultrasound guided injection is preferred based studies that show increased duration, increased effect, greater accuracy, decreased procedural pain, increased response rate with ultrasound guided versus blind injection.  Verbal informed consent obtained.  Time-out conducted.  Noted no overlying erythema, induration, or other signs of local infection.  Skin prepped in a sterile fashion.  Local anesthesia: Topical Ethyl chloride.  With sterile technique and under real time ultrasound guidance:  Joint visualized.  23g 1  inch needle inserted posterior approach. Pictures taken for needle placement. Patient did have injection of 2 cc of 1% lidocaine, 2 cc of 0.5% Marcaine, and 1.0 cc of Kenalog 40 mg/dL. Completed without difficulty  Pain immediately resolved suggesting accurate placement of the medication.  Advised to call  if fevers/chills, erythema, induration, drainage, or persistent bleeding.  Images permanently stored and available for review in the ultrasound unit.  Impression: Technically successful ultrasound guided injection.       Impression and Recommendations:     This case required medical decision making of moderate complexity.

## 2015-05-09 ENCOUNTER — Ambulatory Visit: Payer: BC Managed Care – PPO | Admitting: Family Medicine

## 2015-05-14 ENCOUNTER — Other Ambulatory Visit: Payer: Self-pay | Admitting: Cardiology

## 2015-05-14 ENCOUNTER — Ambulatory Visit: Payer: BC Managed Care – PPO | Admitting: Family Medicine

## 2015-05-21 ENCOUNTER — Ambulatory Visit (INDEPENDENT_AMBULATORY_CARE_PROVIDER_SITE_OTHER): Payer: BC Managed Care – PPO | Admitting: Family Medicine

## 2015-05-21 ENCOUNTER — Ambulatory Visit (INDEPENDENT_AMBULATORY_CARE_PROVIDER_SITE_OTHER)
Admission: RE | Admit: 2015-05-21 | Discharge: 2015-05-21 | Disposition: A | Discharge status: Home / Self Care | Payer: BC Managed Care – PPO | Source: Ambulatory Visit | Attending: Family Medicine | Admitting: Family Medicine

## 2015-05-21 VITALS — BP 132/78 | HR 60 | Wt 147.0 lb

## 2015-05-21 DIAGNOSIS — M25512 Pain in left shoulder: Secondary | ICD-10-CM

## 2015-05-21 DIAGNOSIS — M7502 Adhesive capsulitis of left shoulder: Secondary | ICD-10-CM | POA: Diagnosis not present

## 2015-05-21 DIAGNOSIS — M7552 Bursitis of left shoulder: Secondary | ICD-10-CM

## 2015-05-21 MED ORDER — VITAMIN D (ERGOCALCIFEROL) 1.25 MG (50000 UNIT) PO CAPS: 50000.0000 [IU] | ORAL_CAPSULE | ORAL | Status: DC

## 2015-05-21 MED ORDER — TRAMADOL HCL 50 MG PO TABS: 50.0000 mg | ORAL_TABLET | Freq: Every evening | ORAL | Status: DC | PRN

## 2015-05-21 NOTE — Patient Instructions (Signed)
Good to see you PT will be calling you Ice is still good  Once weekly vitamin D for next 8 weeks.  Xray downstairs today  Tramadol at night if needed Write me in 2 weeks with an update if no better we will need to do MRI Otherwise see me again in 4-5 weeks and we can repeat one more injection if needed.

## 2015-05-21 NOTE — Assessment & Plan Note (Signed)
Discussed with patient at great length. I do believe that the present shoulders likely the main cause. Differential does include a worsening labral tear. I do not feel that the ultrasound would be more beneficial at this point and if any further testing is necessary an MR arthrogram would be needed. Patient elected try conservative therapy first. High-dose vitamin D prescription given as well as tramadol for any breakthrough pain at night. Patient states she elected to do the formal physical therapy as well. Discussed with patient though that every day point any worsening pain, any weakness that advance imaging would be warranted such as an MR arthrogram. Patient will call otherwise will follow-up again in 4 weeks for further evaluation. At that time we can try another injection if necessary.  Spent  25 minutes with patient face-to-face and had greater than 50% of counseling including as described above in assessment and plan.

## 2015-05-21 NOTE — Progress Notes (Signed)
[facility] [address] [place], [place] [address] Phone: [phone] Subjective:     CC: Left shoulder pain follow up  [id number] Alexis Bradley is a 50 y.o. female coming in with complaint of left shoulder pain. Patient was found to have subacromial bursitis, posterior labral tear, as well as frozen shoulder. Patient at last exam seem to have near full range of motion but continued to have pain. Did respond fairly well to the injection. Starting though unfortunately worn off significantly sooner this time. Starting have the pain again. Describes the pain as a dull aching sensation. Starting have limitation in range of motion again as well.   Past Medical History  Diagnosis Date  . Palpitations     a. Holter 2009 with PVCs and bigeminy. b. Holter 1/12 with PVCs and short runs of atrial tachycardia (up to 9 beats). c. Holter 12/2014: rare PACs, rare short runs of narrow complex tachycardia c/w atrial tachycardia.  . S/P left heart catheterization by percutaneous approach 2009    a. cath 11/2007: minor luminal irregularities, EF 60%.  . Fatigue   . 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 GERD (gastroesophageal reflux disease) Feb 2011    esophagitis by EGD Feb 2011  . Gastric polyps March 2012    Endoscopy  . Hiatal hernia   . 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 Varicose veins   . PAT (paroxysmal atrial tachycardia) ([place])   . Premature atrial contractions   . PVC's (premature ventricular contractions)   . Sinus bradycardia     a. HR occasionally 40s per patient, rare dips to 30s.   Past Surgical History  Procedure Laterality Date  . Cardiac catheterization  2009    Left heart cath  . Cholecystectomy    . Vesicovaginal fistula closure w/ tah    . Cesarean section      . Abdominal hysterectomy    . Right gsv elas with 10-20 stab phlebectomies right calf  05-25-11   Social History   Social History  . Marital Status: Married    Spouse Name: N/A  . Number of Children: N/A  . Years of Education: N/A   Occupational History  . 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 Smoking status: Never Smoker   . Smokeless tobacco: Never Used  . Alcohol Use: 1.5 oz/week    3 drink(s) per week     Comment: Rarely  . Drug Use: No  . Sexual Activity: Not on file   Other Topics Concern  . Not on file   Social History Narrative   Married   Allergies  Allergen Reactions  . Tape Hives and Other (See Comments)    redness   Family History  Problem Relation Age of Onset  . Coronary artery disease Neg Hx     Premature  . Stroke Other     Grandfather  . Breast cancer Sister   . Breast cancer Sister   . Breast cancer Maternal Grandmother   . Breast cancer Paternal Grandmother   . Heart disease Paternal Grandfather         Past medical history, social, surgical and family history all reviewed in electronic medical record.  Review of Systems: No headache, visual changes, nausea, vomiting, diarrhea, constipation, dizziness, abdominal pain, skin rash, fevers, chills, night sweats, weight loss, swollen lymph nodes, body aches, joint swelling, muscle aches, chest pain, shortness of breath, mood changes.   Objective Blood pressure 132/78, pulse 60, weight 147 lb (66.679 kg), SpO2 97 %.  General: No apparent distress alert and oriented x3 mood and affect normal, dressed appropriately.  HEENT: Pupils equal, extraocular movements intact  Respiratory: Patient's speak in full sentences and does not appear short of breath patient does have some mild anterior cervical lymphadenopathy with a very enlarged lymph node that is tender to palpation and mobile. Cardiovascular: No lower extremity  edema, non tender, no erythema  Skin: Warm dry intact with no signs of infection or rash on extremities or on axial skeleton.  Abdomen: Soft nontender  Neuro: Cranial nerves II through XII are intact, neurovascularly intact in all extremities with 2+ DTRs and 2+ pulses.  Lymph: No lymphadenopathy of posterior or anterior cervical chain or axillae bilaterally.  Gait normal with good balance and coordination.  MSK:  Non tender with full range of motion and good stability and symmetric strength and tone of  elbows, wrist, hip, knee and ankles bilaterally.  Shoulder: left Inspection reveals no abnormalities, atrophy or asymmetry. Palpation is normal with no tenderness over AC joint or bicipital groove. Patient is now decreased lacking the last 5 of external and internal range of motion and has for flexion of 160. This is worse than previous exam Rotator cuff strength 4 out of 5 signs of impingement with positive Neer and Hawkin's tests, but negative empty can sign. Speeds and Yergason's tests normal. All positive labral pathology with O'Brien's Normal scapular function observed. No painful arc and no drop arm sign. No apprehension sign ContrLateral shoulder unremarkable.        Impression and Recommendations:     This case required medical decision making of moderate complexity.

## 2015-06-04 ENCOUNTER — Ambulatory Visit: Payer: BC Managed Care – PPO | Admitting: Internal Medicine

## 2015-06-04 LAB — HM MAMMOGRAPHY

## 2015-06-27 ENCOUNTER — Ambulatory Visit: Payer: BC Managed Care – PPO | Admitting: Family Medicine

## 2015-07-05 ENCOUNTER — Ambulatory Visit: Payer: BC Managed Care – PPO | Admitting: Family Medicine

## 2015-08-05 ENCOUNTER — Other Ambulatory Visit: Payer: Self-pay | Admitting: Internal Medicine

## 2015-10-08 ENCOUNTER — Ambulatory Visit (INDEPENDENT_AMBULATORY_CARE_PROVIDER_SITE_OTHER): Payer: BC Managed Care – PPO | Admitting: Internal Medicine

## 2015-10-08 ENCOUNTER — Encounter: Payer: Self-pay | Admitting: Internal Medicine

## 2015-10-08 VITALS — BP 138/78 | HR 56 | Temp 98.0°F | Resp 12 | Ht 64.25 in | Wt 147.8 lb

## 2015-10-08 DIAGNOSIS — Z23 Encounter for immunization: Secondary | ICD-10-CM

## 2015-10-08 DIAGNOSIS — Z0000 Encounter for general adult medical examination without abnormal findings: Secondary | ICD-10-CM

## 2015-10-08 DIAGNOSIS — D235 Other benign neoplasm of skin of trunk: Secondary | ICD-10-CM

## 2015-10-08 DIAGNOSIS — E785 Hyperlipidemia, unspecified: Secondary | ICD-10-CM | POA: Diagnosis not present

## 2015-10-08 DIAGNOSIS — D225 Melanocytic nevi of trunk: Secondary | ICD-10-CM

## 2015-10-08 DIAGNOSIS — E559 Vitamin D deficiency, unspecified: Secondary | ICD-10-CM | POA: Diagnosis not present

## 2015-10-08 DIAGNOSIS — R634 Abnormal weight loss: Secondary | ICD-10-CM

## 2015-10-08 DIAGNOSIS — R5383 Other fatigue: Secondary | ICD-10-CM | POA: Diagnosis not present

## 2015-10-08 LAB — COMPREHENSIVE METABOLIC PANEL
ALBUMIN: 4.7 g/dL (ref 3.5–5.2)
ALT: 12 U/L (ref 0–35)
AST: 17 U/L (ref 0–37)
Alkaline Phosphatase: 59 U/L (ref 39–117)
BUN: 14 mg/dL (ref 6–23)
CALCIUM: 9.8 mg/dL (ref 8.4–10.5)
CHLORIDE: 101 meq/L (ref 96–112)
CO2: 29 meq/L (ref 19–32)
Creatinine, Ser: 0.71 mg/dL (ref 0.40–1.20)
GFR: 91.86 mL/min (ref >60.00)
Glucose, Bld: 84 mg/dL (ref 70–99)
POTASSIUM: 4 meq/L (ref 3.5–5.1)
Sodium: 138 mEq/L (ref 135–145)
Total Bilirubin: 0.6 mg/dL (ref 0.2–1.2)
Total Protein: 7.5 g/dL (ref 6.0–8.3)

## 2015-10-08 LAB — LIPID PANEL
CHOLESTEROL: 231 mg/dL — AB (ref 0–200)
HDL: 106.9 mg/dL (ref >39.00)
LDL Cholesterol: 101 mg/dL — ABNORMAL HIGH (ref 0–99)
NONHDL: 123.76
TRIGLYCERIDES: 112 mg/dL (ref 0.0–149.0)
Total CHOL/HDL Ratio: 2
VLDL: 22.4 mg/dL (ref 0.0–40.0)

## 2015-10-08 LAB — TSH: TSH: 2.73 u[IU]/mL (ref 0.35–4.50)

## 2015-10-08 LAB — VITAMIN D 25 HYDROXY (VIT D DEFICIENCY, FRACTURES): VITD: 83.49 ng/mL (ref 30.00–100.00)

## 2015-10-08 NOTE — Patient Instructions (Signed)
You got the TDaP vaccine today!  Expect a sore upper arm for a few days,  use advil/tylenol    You might want to try a premixed protein drink called Premier Protein shake in the morning.  It is available at [facility]  and very low sugar.    160 cal  30 g protein  1 g sugar 50% calcium needs   Menopause is a normal process in which your reproductive ability comes to an end. This process happens gradually over a span of months to years, usually between the ages of 33 and 21. Menopause is complete when you have missed 12 consecutive menstrual periods. It is important to talk with your health care provider about some of the most common conditions that affect postmenopausal women, such as heart disease, cancer, and bone loss (osteoporosis). Adopting a healthy lifestyle and getting preventive care can help to promote your health and wellness. Those actions can also lower your chances of developing some of these common conditions. WHAT SHOULD I KNOW ABOUT MENOPAUSE? During menopause, you may experience a number of symptoms, such as:  Moderate-to-severe hot flashes.  Night sweats.  Decrease in sex drive.  Mood swings.  Headaches.  Tiredness.  Irritability.  Memory problems.  Insomnia. Choosing to treat or not to treat menopausal changes is an individual decision that you make with your health care provider. WHAT SHOULD I KNOW ABOUT HORMONE REPLACEMENT THERAPY AND SUPPLEMENTS? Hormone therapy products are effective for treating symptoms that are associated with menopause, such as hot flashes and night sweats. Hormone replacement carries certain risks, especially as you become older. If you are thinking about using estrogen or estrogen with progestin treatments, discuss the benefits and risks with your health care provider. WHAT SHOULD I KNOW ABOUT HEART DISEASE AND STROKE? Heart disease, heart attack, and stroke become more likely as you age. This may be due, in part, to the  hormonal changes that your body experiences during menopause. These can affect how your body processes dietary fats, triglycerides, and cholesterol. Heart attack and stroke are both medical emergencies. There are many things that you can do to help prevent heart disease and stroke:  Have your blood pressure checked at least every 1-2 years. High blood pressure causes heart disease and increases the risk of stroke.  If you are 5-98 years old, ask your health care provider if you should take aspirin to prevent a heart attack or a stroke.  Do not use any tobacco products, including cigarettes, chewing tobacco, or electronic cigarettes. If you need help quitting, ask your health care provider.  It is important to eat a healthy diet and maintain a healthy weight.  Be sure to include plenty of vegetables, fruits, low-fat dairy products, and lean protein.  Avoid eating foods that are high in solid fats, added sugars, or salt (sodium).  Get regular exercise. This is one of the most important things that you can do for your health.  Try to exercise for at least 150 minutes each week. The type of exercise that you do should increase your heart rate and make you sweat. This is known as moderate-intensity exercise.  Try to do strengthening exercises at least twice each week. Do these in addition to the moderate-intensity exercise.  Know your numbers.Ask your health care provider to check your cholesterol and your blood glucose. Continue to have your blood tested as directed by your health care provider. WHAT SHOULD I KNOW ABOUT CANCER SCREENING? There are  several types of cancer. Take the following steps to reduce your risk and to catch any cancer development as early as possible. Breast Cancer  Practice breast self-awareness.  This means understanding how your breasts normally appear and feel.  It also means doing regular breast self-exams. Let your health care provider know about any changes,  no matter how small.  If you are 26 or older, have a clinician do a breast exam (clinical breast exam or CBE) every year. Depending on your age, family history, and medical history, it may be recommended that you also have a yearly breast X-ray (mammogram).  If you have a family history of breast cancer, talk with your health care provider about genetic screening.  If you are at high risk for breast cancer, talk with your health care provider about having an MRI and a mammogram every year.  Breast cancer (BRCA) gene test is recommended for women who have family members with BRCA-related cancers. Results of the assessment will determine the need for genetic counseling and BRCA1 and for BRCA2 testing. BRCA-related cancers include these types:  Breast. This occurs in males or females.  Ovarian.  Tubal. This may also be called fallopian tube cancer.  Cancer of the abdominal or pelvic lining (peritoneal cancer).  Prostate.  Pancreatic. Cervical, Uterine, and Ovarian Cancer Your health care provider may recommend that you be screened regularly for cancer of the pelvic organs. These include your ovaries, uterus, and vagina. This screening involves a pelvic exam, which includes checking for microscopic changes to the surface of your cervix (Pap test).  For women ages 21-65, health care providers may recommend a pelvic exam and a Pap test every three years. For women ages 68-65, they may recommend the Pap test and pelvic exam, combined with testing for human papilloma virus (HPV), every five years. Some types of HPV increase your risk of cervical cancer. Testing for HPV may also be done on women of any age who have unclear Pap test results.  Other health care providers may not recommend any screening for nonpregnant women who are considered low risk for pelvic cancer and have no symptoms. Ask your health care provider if a screening pelvic exam is right for you.  If you have had past treatment for  cervical cancer or a condition that could lead to cancer, you need Pap tests and screening for cancer for at least 20 years after your treatment. If Pap tests have been discontinued for you, your risk factors (such as having a new sexual partner) need to be reassessed to determine if you should start having screenings again. Some women have medical problems that increase the chance of getting cervical cancer. In these cases, your health care provider may recommend that you have screening and Pap tests more often.  If you have a family history of uterine cancer or ovarian cancer, talk with your health care provider about genetic screening.  If you have vaginal bleeding after reaching menopause, tell your health care provider.  There are currently no reliable tests available to screen for ovarian cancer. Lung Cancer Lung cancer screening is recommended for adults 72-57 years old who are at high risk for lung cancer because of a history of smoking. A yearly low-dose CT scan of the lungs is recommended if you:  Currently smoke.  Have a history of at least 30 pack-years of smoking and you currently smoke or have quit within the past 15 years. A pack-year is smoking an average of one pack  of cigarettes per day for one year. Yearly screening should:  Continue until it has been 15 years since you quit.  Stop if you develop a health problem that would prevent you from having lung cancer treatment. Colorectal Cancer  This type of cancer can be detected and can often be prevented.  Routine colorectal cancer screening usually begins at age 74 and continues through age 79.  If you have risk factors for colon cancer, your health care provider may recommend that you be screened at an earlier age.  If you have a family history of colorectal cancer, talk with your health care provider about genetic screening.  Your health care provider may also recommend using home test kits to check for hidden blood in  your stool.  A small camera at the end of a tube can be used to examine your colon directly (sigmoidoscopy or colonoscopy). This is done to check for the earliest forms of colorectal cancer.  Direct examination of the colon should be repeated every 5-10 years until age 81. However, if early forms of precancerous polyps or small growths are found or if you have a family history or genetic risk for colorectal cancer, you may need to be screened more often. Skin Cancer  Check your skin from head to toe regularly.  Monitor any moles. Be sure to tell your health care provider:  About any new moles or changes in moles, especially if there is a change in a mole's shape or color.  If you have a mole that is larger than the size of a pencil eraser.  If any of your family members has a history of skin cancer, especially at a young age, talk with your health care provider about genetic screening.  Always use sunscreen. Apply sunscreen liberally and repeatedly throughout the day.  Whenever you are outside, protect yourself by wearing long sleeves, pants, a wide-brimmed hat, and sunglasses. WHAT SHOULD I KNOW ABOUT OSTEOPOROSIS? Osteoporosis is a condition in which bone destruction happens more quickly than new bone creation. After menopause, you may be at an increased risk for osteoporosis. To help prevent osteoporosis or the bone fractures that can happen because of osteoporosis, the following is recommended:  If you are 25-51 years old, get at least 1,000 mg of calcium and at least 600 mg of vitamin D per day.  If you are older than age 31 but younger than age 51, get at least 1,200 mg of calcium and at least 600 mg of vitamin D per day.  If you are older than age 24, get at least 1,200 mg of calcium and at least 800 mg of vitamin D per day. Smoking and excessive alcohol intake increase the risk of osteoporosis. Eat foods that are rich in calcium and vitamin D, and do weight-bearing exercises  several times each week as directed by your health care provider. WHAT SHOULD I KNOW ABOUT HOW MENOPAUSE AFFECTS [place]? Depression may occur at any age, but it is more common as you become older. Common symptoms of depression include:  Low or sad mood.  Changes in sleep patterns.  Changes in appetite or eating patterns.  Feeling an overall lack of motivation or enjoyment of activities that you previously enjoyed.  Frequent crying spells. Talk with your health care provider if you think that you are experiencing depression. WHAT SHOULD I KNOW ABOUT IMMUNIZATIONS? It is important that you get and maintain your immunizations. These include:  Tetanus, diphtheria, and pertussis (Tdap) booster vaccine.  Influenza every year before the flu season begins.  Pneumonia vaccine.  Shingles vaccine. Your health care provider may also recommend other immunizations.   This information is not intended to replace advice given to you by your health care provider. Make sure you discuss any questions you have with your health care provider.   Document Released: 06/05/2005 Document Revised: 05/04/2014 Document Reviewed: 12/14/2013 Elsevier Interactive Patient Education [employer].

## 2015-10-08 NOTE — Progress Notes (Signed)
Patient ID: Alexis Bradley, female    DOB: [birth year]  Age: 50 y.o. MRN: [id number]  The patient is here for annual non gyn  wellness examination and management of other chronic and acute problems.   Grief , father committed suicide April 2016 Some marital discord  No sexual drive,  Some dyspareunia   The risk factors are reflected in the social history.  The roster of all physicians providing medical care to patient - is listed in the Snapshot section of the chart.   Home safety : The patient has smoke detectors in the home. They wear seatbelts.  There are no firearms at home. There is no violence in the home.   There is no risks for hepatitis, STDs or HIV. There is no   history of blood transfusion. They have no travel history to infectious disease endemic areas of the world.  The patient has seen their dentist in the last six month. They have not seen their eye doctor in the last year.   They do not  have excessive sun exposure. Discussed the need for sun protection: hats, long sleeves and use of sunscreen if there is significant sun exposure.   Diet: the importance of a healthy diet is discussed. They do have a healthy diet.  The benefits of regular aerobic exercise were discussed. She walks 4 times per week ,  20 minutes.   Depression screen: there are multiple  signs or vegative symptoms of depression- irritability, change in appetite, anhedonia, sadness/tearfullness.  Cognitive assessment: the patient manages all their financial and personal affairs and is actively engaged. They could relate day,date,year and events; recalled 2/3 objects at 3 minutes; performed clock-face test normally.  The following portions of the patient's history were reviewed and updated as appropriate: allergies, current medications, past family history, past medical history,  past surgical history, past social history  and problem list.  Visual acuity was not assessed per patient preference since she has  regular follow up with her ophthalmologist. Hearing and body mass index were assessed and reviewed.   During the course of the visit the patient was educated and counseled about appropriate screening and preventive services including : fall prevention , diabetes screening, nutrition counseling, colorectal cancer screening, and recommended immunizations.    CC: The primary encounter diagnosis was Dysplastic nevus of trunk. Diagnoses of Other fatigue, Hyperlipidemia, Vitamin D deficiency, Need for prophylactic vaccination with combined diphtheria-tetanus-pertussis (DTP) vaccine, Loss of weight, and Visit for preventive health examination were also pertinent to this visit.  History Millisa has a past medical history of Palpitations; S/P left heart catheterization by percutaneous approach (2009); Fatigue; Echocardiogram abnormal (05/2010); GERD (gastroesophageal reflux disease) (Feb 2011); Gastric polyps (March 2012); Hiatal hernia; Poor circulation of extremity ([place]); Varicose veins; PAT (paroxysmal atrial tachycardia) ([place]); Premature atrial contractions; PVC's (premature ventricular contractions); and Sinus bradycardia.   She has past surgical history that includes Cardiac catheterization (2009); Cholecystectomy; Vesicovaginal fistula closure w/ TAH; Cesarean section; Abdominal hysterectomy; and Right GSV ELAS with 10-20 stab phlebectomies right calf (05-25-11).   Her family history includes Breast cancer in her maternal grandmother, paternal grandmother, sister, and sister; Heart disease in her paternal grandfather; Stroke in her other. There is no history of Coronary artery disease.She reports that she has never smoked. She has never used smokeless tobacco. She reports that she drinks about 1.5 oz of alcohol per week. She reports that she does not use illicit drugs.  Outpatient Prescriptions Prior to Visit  Medication Sig Dispense  Refill  . Ascorbic Acid (VITAMIN C) 1000 MG tablet Take 1,000 mg by  mouth daily.    Marland Kitchen CARTIA XT 120 MG 24 hr capsule TAKE 1 CAPSULE(120 MG) BY MOUTH DAILY 30 capsule 5  . MAGNESIUM PO Take 1 tablet by mouth daily.      . Multiple Vitamin (MULTIVITAMIN) tablet Take 1 tablet by mouth daily.    . traMADol (ULTRAM) 50 MG tablet Take 1 tablet (50 mg total) by mouth at bedtime as needed. (Patient not taking: Reported on 10/08/2015) 30 tablet 0  . Vitamin D, Ergocalciferol, (DRISDOL) 50000 units CAPS capsule Take 1 capsule (50,000 Units total) by mouth every 7 (seven) days. (Patient not taking: Reported on 10/08/2015) 8 capsule 0   No facility-administered medications prior to visit.    Review of Systems   Patient denies headache, fevers, malaise, unintentional weight loss, skin rash, eye pain, sinus congestion and sinus pain, sore throat, dysphagia,  hemoptysis , cough, dyspnea, wheezing, chest pain, palpitations, orthopnea, edema, abdominal pain, nausea, melena, diarrhea, constipation, flank pain, dysuria, hematuria, urinary  Frequency, nocturia, numbness, tingling, seizures,  Focal weakness, Loss of consciousness,  Tremor, insomnia, depression, anxiety, and suicidal ideation.      Objective:  BP 138/78 mmHg  Pulse 56  Temp(Src) 98 F (36.7 C) (Oral)  Resp 12  Ht 5' 4.25" (1.632 m)  Wt 147 lb 12 oz (67.019 kg)  BMI 25.16 kg/m2  SpO2 98%  Physical Exam   General appearance: alert, cooperative and appears stated age Ears: normal TM's and external ear canals both ears Throat: lips, mucosa, and tongue normal; teeth and gums normal Neck: no adenopathy, no carotid bruit, supple, symmetrical, trachea midline and thyroid not enlarged, symmetric, no tenderness/mass/nodules Back: symmetric, no curvature. ROM normal. No CVA tenderness. Lungs: clear to auscultation bilaterally Heart: regular rate and rhythm, S1, S2 normal, no murmur, click, rub or gallop Abdomen: soft, non-tender; bowel sounds normal; no masses,  no organomegaly Pulses: 2+ and symmetric Skin: Skin  color, texture, turgor normal. No rashes or lesions Lymph nodes: Cervical, supraclavicular, and axillary nodes normal.    Assessment & Plan:   Problem List Items Addressed This Visit    Visit for preventive health examination    Annual comprehensive preventive exam was done as well as an evaluation and management of chronic conditions .  During the course of the visit the patient was educated and counseled about appropriate screening and preventive services including :  diabetes screening, lipid analysis with projected  10 year  risk for CAD , nutrition counseling, breast, cervical and colorectal cancer screening, and recommended immunizations.  Printed recommendations for health maintenance screenings was given.      Loss of weight    I have congratulated her in reduction of   BMI and encouraged  Continued maintenance of weight loss over the next 6 months using a low glycemic index diet and regular exercise a minimum of 5 days per week.         Other Visit Diagnoses    Dysplastic nevus of trunk    -  Primary    Relevant Orders    Ambulatory referral to Dermatology    Other fatigue        Relevant Orders    Comprehensive metabolic panel (Completed)    HIV antibody (Completed)    TSH (Completed)    Hyperlipidemia        Relevant Orders    Lipid panel (Completed)    Vitamin D deficiency  Relevant Orders    VITAMIN D 25 Hydroxy (Vit-D Deficiency, Fractures) (Completed)    Need for prophylactic vaccination with combined diphtheria-tetanus-pertussis (DTP) vaccine        Relevant Orders    Tdap vaccine greater than or equal to 7yo IM (Completed)       I am having Ms. Townley maintain her MAGNESIUM PO, multivitamin, vitamin C, traMADol, Vitamin D (Ergocalciferol), CARTIA XT, and Vitamin D3.  Meds ordered this encounter  Medications  . Cholecalciferol (VITAMIN D3) 1000 units CAPS    Sig: Take 1 capsule by mouth daily.    There are no discontinued medications.  Follow-up:  No Follow-up on file.   Crecencio Mc, MD

## 2015-10-08 NOTE — Progress Notes (Signed)
Pre-visit discussion using our clinic review tool. No additional management support is needed unless otherwise documented below in the visit note.  

## 2015-10-09 LAB — HIV ANTIBODY (ROUTINE TESTING W REFLEX): HIV: NONREACTIVE

## 2015-10-09 NOTE — Assessment & Plan Note (Signed)
I have congratulated her in reduction of   BMI and encouraged  Continued maintenance of weight loss over the next 6 months using a low glycemic index diet and regular exercise a minimum of 5 days per week.

## 2015-10-09 NOTE — Assessment & Plan Note (Signed)
Annual comprehensive preventive exam was done as well as an evaluation and management of chronic conditions .  During the course of the visit the patient was educated and counseled about appropriate screening and preventive services including :  diabetes screening, lipid analysis with projected  10 year  risk for CAD , nutrition counseling, breast, cervical and colorectal cancer screening, and recommended immunizations.  Printed recommendations for health maintenance screenings was given 

## 2015-10-10 ENCOUNTER — Encounter: Payer: Self-pay | Admitting: Internal Medicine

## 2015-10-10 ENCOUNTER — Other Ambulatory Visit: Payer: Self-pay | Admitting: Internal Medicine

## 2015-10-17 ENCOUNTER — Encounter: Payer: Self-pay | Admitting: Internal Medicine

## 2015-12-24 ENCOUNTER — Ambulatory Visit (INDEPENDENT_AMBULATORY_CARE_PROVIDER_SITE_OTHER): Payer: BC Managed Care – PPO | Admitting: Internal Medicine

## 2015-12-24 ENCOUNTER — Encounter: Payer: Self-pay | Admitting: Internal Medicine

## 2015-12-24 VITALS — BP 132/82 | HR 51 | Temp 98.0°F | Wt 148.8 lb

## 2015-12-24 DIAGNOSIS — R5383 Other fatigue: Secondary | ICD-10-CM | POA: Diagnosis not present

## 2015-12-24 DIAGNOSIS — R590 Localized enlarged lymph nodes: Secondary | ICD-10-CM

## 2015-12-24 NOTE — Progress Notes (Signed)
Subjective:    Patient ID: Alexis Bradley, female    DOB: 03/01/1966, 50 y.o.   MRN: [id number]  HPI  Pt presents to the clinic today with c/o lymph node swelling on the right side of her neck. She noticed this 3-4 days ago. She denies sore throat or difficulty swallowing. She denies runny nose, nasal congestion, ear pain or cough. She denies fever or chills but has been more fatigued. She has taken Ibuprofen with some relief. She has not had sick contacts that she is aware of. She reports she has had something like this in the past, and Dr. Tamala Julian prescribed her antibiotics which resolved her symptoms.   Review of Systems  Past Medical History:  Diagnosis Date  . 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 Fatigue   . Gastric polyps March 2012   Endoscopy  . GERD (gastroesophageal reflux disease) Feb 2011   esophagitis by EGD Feb 2011  . Hiatal hernia   . Palpitations    a. Holter 2009 with PVCs and bigeminy. b. Holter 1/12 with PVCs and short runs of atrial tachycardia (up to 9 beats). c. Holter 12/2014: rare PACs, rare short runs of narrow complex tachycardia c/w atrial tachycardia.  Marland Kitchen PAT (paroxysmal atrial tachycardia) ([place])   . 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 Premature atrial contractions   . PVC's (premature ventricular contractions)   . S/P left heart catheterization by percutaneous approach 2009   a. cath 11/2007: minor luminal irregularities, EF 60%.  . Sinus bradycardia    a. HR occasionally 40s per patient, rare dips to 30s.  . Varicose veins     Current Outpatient Prescriptions  Medication Sig Dispense Refill  . Ascorbic Acid (VITAMIN C) 1000 MG tablet Take 1,000 mg by mouth daily.    Marland Kitchen CARTIA XT 120 MG 24 hr capsule TAKE 1 CAPSULE(120 MG) BY MOUTH DAILY 30 capsule 5  . Cholecalciferol  (VITAMIN D3) 1000 units CAPS Take 1 capsule by mouth daily.    Marland Kitchen MAGNESIUM PO Take 1 tablet by mouth daily.      . Multiple Vitamin (MULTIVITAMIN) tablet Take 1 tablet by mouth daily.     No current facility-administered medications for this visit.     Allergies  Allergen Reactions  . Tape Hives and Other (See Comments)    redness    Family History  Problem Relation Age of Onset  . Coronary artery disease Neg Hx     Premature  . Stroke Other     Grandfather  . Breast cancer Sister   . Breast cancer Sister   . Breast cancer Maternal Grandmother   . Breast cancer Paternal Grandmother   . Heart disease Paternal Grandfather     Social History   Social History  . Marital status: Married    Spouse name: N/A  . Number of children: N/A  . Years of education: N/A   Occupational History  . 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 Smoking status: Never Smoker  . Smokeless tobacco: Never Used  . Alcohol use 1.5 oz/week    3 drink(s) per week     Comment: Rarely  . Drug use: No  . Sexual activity: Not on file   Other  Topics Concern  . Not on file   Social History Narrative   Married     Constitutional: Pt reports fatigue. Denies fever, malaise, headache or abrupt weight changes.  HEENT: Pt reports lymph node enlargement. Denies eye pain, eye redness, ear pain, ringing in the ears, wax buildup, runny nose, nasal congestion, bloody nose, or sore throat. Respiratory: Denies difficulty breathing, shortness of breath, cough or sputum production.   Skin: Denies redness, rashes, lesions or ulcercations.   No other specific complaints in a complete review of systems (except as listed in HPI above).     Objective:   Physical Exam  BP 132/82   Pulse (!) 51   Temp 98 F (36.7 C) (Oral)   Wt 148 lb 12 oz (67.5 kg)   SpO2 99%   BMI 25.33 kg/m  Wt Readings from Last 3 Encounters:  12/24/15  148 lb 12 oz (67.5 kg)  10/08/15 147 lb 12 oz (67 kg)  05/21/15 147 lb (66.7 kg)    General: Appears her stated age,  in NAD. HEENT: Head: normal shape and size, no sinus tenderness noted; Ears: Tm's gray and intact, normal light reflex; Nose: mucosa pink and moist, septum midline; Throat/Mouth: Teeth present, mucosa pink and moist, no exudate, lesions or ulcerations noted.  Neck:  Anterior cervical adenopathy noted on the right.  Pulmonary/Chest: Normal effort and positive vesicular breath sounds. No respiratory distress. No wheezes, rales or ronchi noted.   BMET    Component Value Date/Time   NA 138 10/08/2015 1519   K 4.0 10/08/2015 1519   CL 101 10/08/2015 1519   CO2 29 10/08/2015 1519   GLUCOSE 84 10/08/2015 1519   BUN 14 10/08/2015 1519   CREATININE 0.71 10/08/2015 1519   CALCIUM 9.8 10/08/2015 1519    Lipid Panel     Component Value Date/Time   CHOL 231 (H) 10/08/2015 1519   TRIG 112.0 10/08/2015 1519   HDL 106.90 10/08/2015 1519   CHOLHDL 2 10/08/2015 1519   VLDL 22.4 10/08/2015 1519   LDLCALC 101 (H) 10/08/2015 1519    CBC    Component Value Date/Time   WBC 5.3 01/04/2015 0950   RBC 4.24 01/04/2015 0950   HGB 13.4 01/04/2015 0950   HCT 39.6 01/04/2015 0950   PLT 224.0 01/04/2015 0950   MCV 93.3 01/04/2015 0950   MCHC 33.9 01/04/2015 0950   RDW 13.1 01/04/2015 0950   LYMPHSABS 1.8 01/04/2015 0950   MONOABS 0.4 01/04/2015 0950   EOSABS 0.1 01/04/2015 0950   BASOSABS 0.0 01/04/2015 0950    Hgb A1C Lab Results  Component Value Date   HGBA1C 5.6 09/08/2013            Assessment & Plan:   Cervical adenopathy:  No s/s of bacterial infection 80 mg Depo IM today Continue Ibuprofen as needed  RTC as needed or if symptoms persist or worsen BAITY, REGINA, NP

## 2015-12-24 NOTE — Patient Instructions (Signed)

## 2015-12-25 ENCOUNTER — Ambulatory Visit: Payer: BC Managed Care – PPO | Admitting: Family

## 2015-12-25 MED ORDER — METHYLPREDNISOLONE ACETATE 80 MG/ML IJ SUSP
80.0000 mg | Freq: Once | INTRAMUSCULAR | Status: AC
  Administered 2015-12-24: 80 mg via INTRAMUSCULAR

## 2015-12-25 NOTE — Addendum Note (Signed)
Addended by: Lurlean Nanny on: 12/25/2015 12:11 PM   Modules accepted: Orders

## 2016-01-21 NOTE — Progress Notes (Signed)
[name] [address], [place] [address] Phone: [phone] Subjective:     CC: Right foot pain  [id number]  STARNISHA CACCIOLA is a 50 y.o. female coming in with complaint of Right foot pain States that she has a runner. Patient states that since she's been running she has noticed a heel pain. Now hurting her with even regular daily activity. Patient has noticed also numbness on the top of her foot now. Denies any weakness. States that seems to be more of a chronic aching sensation. Changing shoes seems to make some helpful changes. Patient is running in a competitive 5K this week.   Past Medical History:  Diagnosis Date  . 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 Fatigue   . Gastric polyps March 2012   Endoscopy  . GERD (gastroesophageal reflux disease) Feb 2011   esophagitis by EGD Feb 2011  . Hiatal hernia   . Palpitations    a. Holter 2009 with PVCs and bigeminy. b. Holter 1/12 with PVCs and short runs of atrial tachycardia (up to 9 beats). c. Holter 12/2014: rare PACs, rare short runs of narrow complex tachycardia c/w atrial tachycardia.  Marland Kitchen PAT (paroxysmal atrial tachycardia) ([place])   . 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 Premature atrial contractions   . PVC's (premature ventricular contractions)   . S/P left heart catheterization by percutaneous approach 2009   a. cath 11/2007: minor luminal irregularities, EF 60%.  . Sinus bradycardia    a. HR occasionally 40s per patient, rare dips to 30s.  . Varicose veins    Past Surgical History:  Procedure Laterality Date  . ABDOMINAL HYSTERECTOMY    . CARDIAC CATHETERIZATION  2009   Left heart cath  . CESAREAN SECTION    . CHOLECYSTECTOMY    . Right GSV ELAS with 10-20 stab phlebectomies right calf  05-25-11  .  VESICOVAGINAL FISTULA CLOSURE W/ TAH     Social History   Social History  . Marital status: Married    Spouse name: N/A  . Number of children: N/A  . Years of education: N/A   Occupational History  . 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 Smoking status: Never Smoker  . Smokeless tobacco: Never Used  . Alcohol use 1.5 oz/week    3 Standard drinks or equivalent per week     Comment: Rarely  . Drug use: No  . Sexual activity: Not on file   Other Topics Concern  . Not on file   Social History Narrative   Married   Allergies  Allergen Reactions  . Tape Hives and Other (See Comments)    redness   Family History  Problem Relation Age of Onset  . Breast cancer Sister   . Breast cancer Maternal Grandmother   . Breast cancer Paternal Grandmother   . Heart disease Paternal Grandfather   . Stroke Other     Grandfather  . Coronary artery disease Neg Hx     Premature        Past medical history, social, surgical and family history all reviewed in electronic medical record.   Review of Systems: No headache, visual changes, nausea, vomiting, diarrhea, constipation, dizziness, abdominal pain, skin  rash, fevers, chills, night sweats, weight loss, swollen lymph nodes, body aches, joint swelling, muscle aches, chest pain, shortness of breath, mood changes.   Objective  Blood pressure 130/80, pulse (!) 52, weight 147 lb 9.6 oz (67 kg).  General: No apparent distress alert and oriented x3 mood and affect normal, dressed appropriately.  HEENT: Pupils equal, extraocular movements intact  Respiratory: Patient's speak in full sentences and does not appear short of breath patient does have some mild anterior cervical lymphadenopathy with a very enlarged lymph node that is tender to palpation and mobile. Cardiovascular: No lower extremity edema, non tender, no erythema  Skin: Warm dry intact with no signs  of infection or rash on extremities or on axial skeleton.  Abdomen: Soft nontender  Neuro: Cranial nerves II through XII are intact, neurovascularly intact in all extremities with 2+ DTRs and 2+ pulses.  Lymph: No lymphadenopathy of posterior or anterior cervical chain or axillae bilaterally.  Gait normal with good balance and coordination.  MSK:  Non tender with full range of motion and good stability and symmetric strength and tone of  elbows, wrist, hip, knee and ankles bilaterally.  Foot exam shows the patient does have some mild splaying between the first and second toe as well as breakdown of the transverse arch bilaterally. Patient does have a positive squeeze test on the right sign. Tenderness to palpation over the plantar aspect of the heel. No pain over the Achilles tendon.  Limited muscular skeletal ultrasound was performed and interpreted by Lyndal Pulley  Limited ultrasound of her foot shows the patient does have a neuroma between the first and second toe with hypoechoic changes and increasing Doppler flow. Patient also has what appears to be a contusion of the heel as well as some mild plantar fasciitis with enlargement of 1.39 cm Impression: Neuroma, plantar fasciitis, heel contusion  Procedure note [id number]; 15 minutes spent for Therapeutic exercises as stated in above notes.  This included exercises focusing on stretching, strengthening, with significant focus on eccentric aspects.   Plantar Fascitis: We reviewed that stretching is critically important to the treatment of PF. Reviewed footwear. Rigid soles have been shown to help with PF. Night splints can help. Reviewed rehab of stretching and calf raises.  Could benefit from a corticosteroid injection, orthotics, or other measures if conservative treatment fails. Proper technique shown and discussed handout in great detail with ATC.  All questions were discussed and answered.        Impression and Recommendations:      This case required medical decision making of moderate complexity.

## 2016-01-22 ENCOUNTER — Other Ambulatory Visit: Payer: Self-pay

## 2016-01-22 ENCOUNTER — Encounter: Payer: Self-pay | Admitting: Family Medicine

## 2016-01-22 ENCOUNTER — Ambulatory Visit (INDEPENDENT_AMBULATORY_CARE_PROVIDER_SITE_OTHER): Payer: BC Managed Care – PPO | Admitting: Family Medicine

## 2016-01-22 VITALS — BP 130/80 | HR 52 | Wt 147.6 lb

## 2016-01-22 DIAGNOSIS — M722 Plantar fascial fibromatosis: Secondary | ICD-10-CM

## 2016-01-22 DIAGNOSIS — S9031XA Contusion of right foot, initial encounter: Secondary | ICD-10-CM

## 2016-01-22 DIAGNOSIS — M79671 Pain in right foot: Secondary | ICD-10-CM

## 2016-01-22 DIAGNOSIS — D3613 Benign neoplasm of peripheral nerves and autonomic nervous system of lower limb, including hip: Secondary | ICD-10-CM | POA: Insufficient documentation

## 2016-01-22 DIAGNOSIS — S9030XA Contusion of unspecified foot, initial encounter: Secondary | ICD-10-CM | POA: Insufficient documentation

## 2016-01-22 MED ORDER — MELOXICAM 15 MG PO TABS
15.0000 mg | ORAL_TABLET | Freq: Every day | ORAL | 0 refills | Status: DC
Start: 2016-01-22 — End: 2016-02-19

## 2016-01-22 MED ORDER — DICLOFENAC SODIUM 2 % TD SOLN: TRANSDERMAL | 3 refills | Status: DC

## 2016-01-22 NOTE — Assessment & Plan Note (Signed)
Patient will get a metatarsal pad we discussed over-the-counter orthotics. Discussed icing regimen.

## 2016-01-22 NOTE — Assessment & Plan Note (Signed)
Patient does have a plantar fasciitis. We discussed rigid soles shoes, over-the-counter orthotics, topical anti-inflammatories and icing pedicle. Patient will try to make these changes. Patient also given a brace that I think be beneficial. Patient will come back and see me again in 4 weeks.

## 2016-01-22 NOTE — Assessment & Plan Note (Signed)
Discussed with patient avoiding certain activities, we discussed avoiding being barefoot. Discussed topical anti-inflammatories and icing. Follow-up again in 4 weeks

## 2016-01-22 NOTE — Patient Instructions (Addendum)
Good to see you.  Exercises 3 times a week.  Avoid being barefoot Good shoes with rigid bottom.  Alexis Bradley, [place] or New balance greater then 700 Running shoes look at [place] or [place].  Ice bath 10-15 minutes at night pennsaid pinkie amount topically 2 times daily as needed.  Meloxicam daily for 10 days then as needed See me again in 4 weeks.

## 2016-02-19 ENCOUNTER — Encounter: Payer: Self-pay | Admitting: Family Medicine

## 2016-02-19 ENCOUNTER — Ambulatory Visit (INDEPENDENT_AMBULATORY_CARE_PROVIDER_SITE_OTHER): Payer: BC Managed Care – PPO | Admitting: Family Medicine

## 2016-02-19 DIAGNOSIS — M722 Plantar fascial fibromatosis: Secondary | ICD-10-CM | POA: Diagnosis not present

## 2016-02-19 DIAGNOSIS — S9031XD Contusion of right foot, subsequent encounter: Secondary | ICD-10-CM | POA: Diagnosis not present

## 2016-02-19 DIAGNOSIS — D3613 Benign neoplasm of peripheral nerves and autonomic nervous system of lower limb, including hip: Secondary | ICD-10-CM

## 2016-02-19 MED ORDER — MELOXICAM 15 MG PO TABS: 15.0000 mg | ORAL_TABLET | Freq: Every day | ORAL | 0 refills | Status: DC

## 2016-02-19 NOTE — Progress Notes (Signed)
[name] [address], [place] [address] Phone: [phone] Subjective:     CC: Right foot pain Follow-up  [id number]  ALEXANDRINA ZEHNER is a 50 y.o. female coming in with complaint of Right foot pain States that she has a runner. Patient previously saw me 1 month ago and was diagnosed with a neuroma between the first and second toes as well as a contusion to the heel itself. Patient was to decrease certain activities, over-the-counter orthotics, topical anti-inflammatories and icing protocol. Patient states she is approximately 50% better. Patient states that she is continuing to run a couple times and has had unfortunate pain within a mild. Patient does have some big races next month. States that it can give her some difficulty even with regular daily activities. Has Shoes which is what seems to be more helpful. Continuing the vitamin D, doing the icing intermittently. Denies any new symptoms and states that the pain at the toes is improved. Less numbness   Past Medical History:  Diagnosis Date  . 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 Fatigue   . Gastric polyps March 2012   Endoscopy  . GERD (gastroesophageal reflux disease) Feb 2011   esophagitis by EGD Feb 2011  . Hiatal hernia   . Palpitations    a. Holter 2009 with PVCs and bigeminy. b. Holter 1/12 with PVCs and short runs of atrial tachycardia (up to 9 beats). c. Holter 12/2014: rare PACs, rare short runs of narrow complex tachycardia c/w atrial tachycardia.  Marland Kitchen PAT (paroxysmal atrial tachycardia) ([place])   . Poor circulation of extremity    Bil legs  . Premature atrial contractions   . PVC's (premature ventricular contractions)   . S/P left heart catheterization by percutaneous approach 2009   a. cath 11/2007: minor luminal  irregularities, EF 60%.  . Sinus bradycardia    a. HR occasionally 40s per patient, rare dips to 30s.  . Varicose veins    Past Surgical History:  Procedure Laterality Date  . ABDOMINAL HYSTERECTOMY    . CARDIAC CATHETERIZATION  2009   Left heart cath  . CESAREAN SECTION    . CHOLECYSTECTOMY    . Right GSV ELAS with 10-20 stab phlebectomies right calf  05-25-11  . VESICOVAGINAL FISTULA CLOSURE W/ TAH     Social History   Social History  . Marital status: Married    Spouse name: N/A  . Number of children: N/A  . Years of education: N/A   Occupational History  . 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 Smoking status: Never Smoker  . Smokeless tobacco: Never Used  . Alcohol use 1.5 oz/week    3 Standard drinks or equivalent per week     Comment: Rarely  . Drug use: No  . Sexual activity: Not on file   Other Topics Concern  . Not on file   Social History Narrative   Married   Allergies  Allergen Reactions  . Tape Hives and Other (See Comments)    redness   Family History  Problem Relation Age of Onset  . Breast cancer Sister   . Breast cancer Maternal Grandmother   . Breast cancer Paternal Grandmother   . Heart disease Paternal Grandfather   .  Stroke Other     Grandfather  . Coronary artery disease Neg Hx     Premature      Past medical history, social, surgical and family history all reviewed in electronic medical record.   Review of Systems: No headache, visual changes, nausea, vomiting, diarrhea, constipation, dizziness, abdominal pain, skin rash, fevers, chills, night sweats, weight loss, swollen lymph nodes, body aches, joint swelling, muscle aches, chest pain, shortness of breath, mood changes.   Objective  Blood pressure 122/80, pulse 62, weight 151 lb (68.5 kg), SpO2 99 %.  General: No apparent distress alert and oriented x3 mood and affect normal, dressed  appropriately.  HEENT: Pupils equal, extraocular movements intact  Respiratory: Patient's speak in full sentences and does not appear short of breath patient does have some mild anterior cervical lymphadenopathy with a very enlarged lymph node that is tender to palpation and mobile. Cardiovascular: No lower extremity edema, non tender, no erythema  Skin: Warm dry intact with no signs of infection or rash on extremities or on axial skeleton.  Abdomen: Soft nontender  Neuro: Cranial nerves II through XII are intact, neurovascularly intact in all extremities with 2+ DTRs and 2+ pulses.  Lymph: No lymphadenopathy of posterior or anterior cervical chain or axillae bilaterally.  Gait normal with good balance and coordination.  MSK:  Non tender with full range of motion and good stability and symmetric strength and tone of  elbows, wrist, hip, knee and ankles bilaterally.  Foot exam shows the patient does have some mild splaying between the first and second toe as well as breakdown of the transverse arch bilaterally. Negative squeeze test. Continues to have discomfort over the plantar aspect of the heel.         Impression and Recommendations:     This case required medical decision making of moderate complexity.

## 2016-02-19 NOTE — Assessment & Plan Note (Signed)
Patient's pain is inconsistent with the plantar fasciitis. Patient's pain seems to be more with activity which goes against the plantar fasciitis. Patient should continue some the exercises and I do believe that this is more from a contusion than anything else. Due to feel that any other advance imaging would be warranted at this time. Patient has declined formal physical therapy.

## 2016-02-19 NOTE — Assessment & Plan Note (Signed)
Continues to have signs and symptoms and more of a contusion of the heel. Minimal improvement from previous exam. Patient given oral anti-inflammatories again. We discussed using it on a regular basis. We discussed icing regimen. We discussed the possibility of once weekly vitamin D which patient declined. Patient is still going to run. We discussed possibly needing custom orthotics in the long run. Patient also declined prednisone. Follow-up again in 4 weeks

## 2016-02-19 NOTE — Patient Instructions (Signed)
Good to see you  Ice is your friend  At this point lets do meloxicam daily for 10 days then as needed.  Keep doing what you are doing.  See me again after the races or if needed we can try injection

## 2016-02-19 NOTE — Assessment & Plan Note (Signed)
Improved at this time. He reiterated the importance of shoe choices as well as lacing. [place] compression.

## 2016-02-24 ENCOUNTER — Other Ambulatory Visit: Payer: Self-pay | Admitting: Cardiology

## 2016-03-28 ENCOUNTER — Other Ambulatory Visit: Payer: Self-pay | Admitting: Cardiology

## 2016-04-07 ENCOUNTER — Ambulatory Visit: Payer: Self-pay

## 2016-04-07 ENCOUNTER — Encounter: Payer: Self-pay | Admitting: Family Medicine

## 2016-04-07 ENCOUNTER — Ambulatory Visit (INDEPENDENT_AMBULATORY_CARE_PROVIDER_SITE_OTHER): Payer: BC Managed Care – PPO | Admitting: Family Medicine

## 2016-04-07 VITALS — BP 112/80 | HR 61 | Ht 65.0 in | Wt 154.0 lb

## 2016-04-07 DIAGNOSIS — M79672 Pain in left foot: Principal | ICD-10-CM

## 2016-04-07 DIAGNOSIS — M722 Plantar fascial fibromatosis: Secondary | ICD-10-CM

## 2016-04-07 DIAGNOSIS — G8929 Other chronic pain: Secondary | ICD-10-CM | POA: Diagnosis not present

## 2016-04-07 NOTE — Assessment & Plan Note (Addendum)
Worsening symptoms. Patient given injection today and tolerated the procedure well. We discussed icing regimen and home exercises. We discussed which activities to do a which ones to avoid. Patient will continue with conservative therapy. Patient will come back and see me again in 2-3 weeks for further evaluation.

## 2016-04-07 NOTE — Progress Notes (Signed)
[name] [address] [place], [place] [address] Phone: [phone] Subjective:     CC: Right foot pain Follow-up  [id number]  Alexis Bradley is a 50 y.o. female coming in with complaint of Right foot pain States that she has a runner. Patient unfortunately was also found to have a neuroma as well as plantar fasciitis. Unfortunately was doing worse for a little bit but then was making improvement. Did have a brace at the end of November and then was in [place] where she does a significant amount walking. Patient states since then has had worsening heel pain on the right side. Worse with the first steps in the morning. Has a soreness at the end of the day as well. Starting to affect daily activities significantly.   Past Medical History:  Diagnosis Date  . 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 Fatigue   . Gastric polyps March 2012   Endoscopy  . GERD (gastroesophageal reflux disease) Feb 2011   esophagitis by EGD Feb 2011  . Hiatal hernia   . Palpitations    a. Holter 2009 with PVCs and bigeminy. b. Holter 1/12 with PVCs and short runs of atrial tachycardia (up to 9 beats). c. Holter 12/2014: rare PACs, rare short runs of narrow complex tachycardia c/w atrial tachycardia.  Marland Kitchen PAT (paroxysmal atrial tachycardia) ([place])   . Poor circulation of extremity    Bil legs  . Premature atrial contractions   . PVC's (premature ventricular contractions)   . S/P left heart catheterization by percutaneous approach 2009   a. cath 11/2007: minor luminal irregularities, EF 60%.  . Sinus bradycardia    a. HR occasionally 40s per patient, rare dips to 30s.  . Varicose veins    Past Surgical History:  Procedure Laterality Date  . ABDOMINAL HYSTERECTOMY    . CARDIAC CATHETERIZATION  2009   Left heart  cath  . CESAREAN SECTION    . CHOLECYSTECTOMY    . Right GSV ELAS with 10-20 stab phlebectomies right calf  05-25-11  . VESICOVAGINAL FISTULA CLOSURE W/ TAH     Social History   Social History  . Marital status: Married    Spouse name: N/A  . Number of children: N/A  . Years of education: N/A   Occupational History  . 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 Smoking status: Never Smoker  . Smokeless tobacco: Never Used  . Alcohol use 1.5 oz/week    3 Standard drinks or equivalent per week     Comment: Rarely  . Drug use: No  . Sexual activity: Not on file   Other Topics Concern  . Not on file   Social History Narrative   Married   Allergies  Allergen Reactions  . Tape Hives and Other (See Comments)    redness   Family History  Problem Relation Age of Onset  . Breast cancer Sister   . Breast cancer Maternal Grandmother   . Breast cancer Paternal Grandmother   . Heart disease Paternal Grandfather   . Stroke Other     Grandfather  . Coronary artery disease Neg Hx     Premature      Past medical history, social, surgical and family history all reviewed  in electronic medical record.   Review of Systems: No headache, visual changes, nausea, vomiting, diarrhea, constipation, dizziness, abdominal pain, skin rash, fevers, chills, night sweats, weight loss, swollen lymph nodes, body aches, joint swelling, muscle aches, chest pain, shortness of breath, mood changes.    Objective  Blood pressure 112/80, pulse 61, height 5\' 5"  (1.651 m), weight 154 lb (69.9 kg), SpO2 97 %.  Systems examined below as of 04/07/16 General: NAD A&O x3 mood, affect normal  HEENT: Pupils equal, extraocular movements intact no nystagmus Respiratory: not short of breath at rest or with speaking Cardiovascular: No lower extremity edema, non tender Skin: Warm dry intact with no signs of infection or rash on  extremities or on axial skeleton. Abdomen: Soft nontender, no masses Neuro: Cranial nerves  intact, neurovascularly intact in all extremities with 2+ DTRs and 2+ pulses. Lymph: No lymphadenopathy appreciated today  Gait normal with good balance and coordination.  MSK: Non tender with full range of motion and good stability and symmetric strength and tone of shoulders, elbows, wrist,  knee hips and ankles bilaterally.   Foot exam shows the patient does have some mild splaying between the first and second toe as well as breakdown of the transverse arch bilaterally. Negative squeeze test significant increase pain at the medial calcaneal region compared to previous exam.  . Procedure: Real-time Ultrasound Guided Injection of right plantar fascia Device: GE Logiq E  Ultrasound guided injection is preferred based studies that show increased duration, increased effect, greater accuracy, decreased procedural pain, increased response rate, and decreased cost with ultrasound guided versus blind injection.  Verbal informed consent obtained.  Time-out conducted.  Noted no overlying erythema, induration, or other signs of local infection.  Skin prepped in a sterile fashion.  Local anesthesia: Topical Ethyl chloride.  With sterile technique and under real time ultrasound guidance:  With a 21-gauge 2 inch needle patient was injected with a total of 0.5 mL of 0.5% Marcaine and 0.5 mL of Kenalog 40 mg/dL. Completed without difficulty  Pain immediately resolved suggesting accurate placement of the medication.  Advised to call if fevers/chills, erythema, induration, drainage, or persistent bleeding.  Images permanently stored and available for review in the ultrasound unit.  Impression: Technically successful ultrasound guided injection.       Impression and Recommendations:     This case required medical decision making of moderate complexity.

## 2016-04-07 NOTE — Patient Instructions (Signed)
God to see you  Alvera Singh is your friend No exercises until Monday then 3 times a week.  Start running after [employer] Happy holidays!  See me again in 3-4 weeks

## 2016-04-27 ENCOUNTER — Other Ambulatory Visit: Payer: Self-pay | Admitting: Cardiology

## 2016-05-05 NOTE — Progress Notes (Signed)
[name] [address] [place], [place] [address] Phone: [phone] Subjective:     CC: Right foot pain Follow-up  [id number]  Alexis Bradley is a 51 y.o. female coming in with complaint of Right foot pain States that she has a runner. Patient unfortunately was also found to have a neuroma as well as plantar fasciitis. Patient was given an injection in the plantar fascia on December 12. Patient was to start increasing activity. Patient is a slowly continued to do the exercises. Patient states he is doing 80% better. No pain with regular daily activities at this time. Has noticed some discomfort when trying to increase her running speed.   Past Medical History:  Diagnosis Date  . 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 Fatigue   . Gastric polyps March 2012   Endoscopy  . GERD (gastroesophageal reflux disease) Feb 2011   esophagitis by EGD Feb 2011  . Hiatal hernia   . Palpitations    a. Holter 2009 with PVCs and bigeminy. b. Holter 1/12 with PVCs and short runs of atrial tachycardia (up to 9 beats). c. Holter 12/2014: rare PACs, rare short runs of narrow complex tachycardia c/w atrial tachycardia.  Marland Kitchen PAT (paroxysmal atrial tachycardia) ([place])   . Poor circulation of extremity    Bil legs  . Premature atrial contractions   . PVC's (premature ventricular contractions)   . S/P left heart catheterization by percutaneous approach 2009   a. cath 11/2007: minor luminal irregularities, EF 60%.  . Sinus bradycardia    a. HR occasionally 40s per patient, rare dips to 30s.  . Varicose veins    Past Surgical History:  Procedure Laterality Date  . ABDOMINAL HYSTERECTOMY    . CARDIAC CATHETERIZATION  2009   Left heart cath  . CESAREAN SECTION    . CHOLECYSTECTOMY    . Right GSV ELAS with 10-20  stab phlebectomies right calf  05-25-11  . VESICOVAGINAL FISTULA CLOSURE W/ TAH     Social History   Social History  . Marital status: Married    Spouse name: N/A  . Number of children: N/A  . Years of education: N/A   Occupational History  . 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 Smoking status: Never Smoker  . Smokeless tobacco: Never Used  . Alcohol use 1.5 oz/week    3 Standard drinks or equivalent per week     Comment: Rarely  . Drug use: No  . Sexual activity: Not on file   Other Topics Concern  . Not on file   Social History Narrative   Married   Allergies  Allergen Reactions  . Tape Hives and Other (See Comments)    redness   Family History  Problem Relation Age of Onset  . Breast cancer Sister   . Breast cancer Maternal Grandmother   . Breast cancer Paternal Grandmother   . Heart disease Paternal Grandfather   . Stroke Other     Grandfather  . Coronary artery disease Neg Hx     Premature      Past medical history, social, surgical and family history all reviewed in electronic medical record.   Review of Systems: No headache, visual changes, nausea, vomiting, diarrhea, constipation, dizziness, abdominal  pain, skin rash, fevers, chills, night sweats, weight loss, swollen lymph nodes, body aches, joint swelling, muscle aches, chest pain, shortness of breath, mood changes.     Objective  Blood pressure 118/82, pulse (!) 56, height 5\' 5"  (1.651 m), weight 150 lb (68 kg), SpO2 97 %.  Systems examined below as of 05/06/16 General: NAD A&O x3 mood, affect normal  HEENT: Pupils equal, extraocular movements intact no nystagmus Respiratory: not short of breath at rest or with speaking Cardiovascular: No lower extremity edema, non tender Skin: Warm dry intact with no signs of infection or rash on extremities or on axial skeleton. Abdomen: Soft nontender, no masses Neuro: Cranial  nerves  intact, neurovascularly intact in all extremities with 2+ DTRs and 2+ pulses. Lymph: No lymphadenopathy appreciated today  Gait normal with good balance and coordination.  MSK: Non tender with full range of motion and good stability and symmetric strength and tone of shoulders, elbows, wrist,  knee hips and ankles bilaterally.      Foot exam shows the patient still has tenderness over the medial calcaneal region of the right foot. Patient has full range of motion of the ankle with no significant pain. No pain over the neuroma. Still has very mild positive squeeze test. Continued have the same breakdown as previously described in the previous notes.  Impression and Recommendations:     This case required medical decision making of moderate complexity.

## 2016-05-06 ENCOUNTER — Encounter: Payer: Self-pay | Admitting: Family Medicine

## 2016-05-06 ENCOUNTER — Ambulatory Visit (INDEPENDENT_AMBULATORY_CARE_PROVIDER_SITE_OTHER): Payer: BC Managed Care – PPO | Admitting: Family Medicine

## 2016-05-06 DIAGNOSIS — M722 Plantar fascial fibromatosis: Secondary | ICD-10-CM

## 2016-05-06 MED ORDER — OSELTAMIVIR PHOSPHATE 75 MG PO CAPS: 75.0000 mg | ORAL_CAPSULE | Freq: Two times a day (BID) | ORAL | 0 refills | Status: DC

## 2016-05-06 NOTE — Assessment & Plan Note (Signed)
Patient given injection 1 month ago. He is 80% better. Start running progression at this time. We discussed icing regimen. Follow-up again in 4 weeks. Patient could be a candidate for custom orthotics if patient continues to have difficulty.

## 2016-05-06 NOTE — Patient Instructions (Signed)
God to see you  Alexis Bradley is your friend.  Start a walk-run progression: Only run 2-3 times a week max.  Once you have reached 30 mins at 1 minute jog and 1 minute walk then  - Run 2 mins, then walk 1 min week 2 -Then run 3 mins, and walk 1 min week 3 -Then run 4 mins, and walk 1 min week 4  -Then run 5 mins, and walk 1 min. -Slowly build up weekly to running 30 mins nonstop.  If painful at any of the steps, back up one step.  Call me if worsening and we will consider customs

## 2016-05-27 ENCOUNTER — Encounter: Payer: Self-pay | Admitting: Internal Medicine

## 2016-05-27 ENCOUNTER — Ambulatory Visit (INDEPENDENT_AMBULATORY_CARE_PROVIDER_SITE_OTHER): Payer: BC Managed Care – PPO | Admitting: Internal Medicine

## 2016-05-27 VITALS — BP 148/84 | HR 59 | Resp 16 | Wt 153.0 lb

## 2016-05-27 DIAGNOSIS — E875 Hyperkalemia: Secondary | ICD-10-CM

## 2016-05-27 DIAGNOSIS — R030 Elevated blood-pressure reading, without diagnosis of hypertension: Secondary | ICD-10-CM | POA: Diagnosis not present

## 2016-05-27 NOTE — Patient Instructions (Signed)
Before we start working up your elevated potassium,  Let's make sure it is elevated per different labs  Get blood pressure  checked 5 times over the next month

## 2016-05-27 NOTE — Progress Notes (Signed)
Subjective:  Patient ID: Alexis Bradley, female    DOB: [birth year]  Age: 51 y.o. MRN: [id number]  CC: The primary encounter diagnosis was Hyperkalemia. Diagnoses of Elevated blood pressure reading in office without diagnosis of hypertension and Elevated blood pressure reading without diagnosis of hypertension were also pertinent to this visit.  HPI JANEAH Bradley presents for evaluation of mild hyperkalemia reported on two separate blood draws done recently  by her gynecologist.  Most recent otassium was 5.3  And was reportedly 5.4 or 5.5 on prior check by gyn.  She has no history of hyperkalemia    upon review of labs from our office.  She denies recent use of NSAIDS,  Supplements,  or opioids,  But takes a MVI ,  Vit D, magnesium  and Vit C daily.  exericses regularly,  Drinks water to rehydrate    No history of hypertension     Outpatient Medications Prior to Visit  Medication Sig Dispense Refill  . Ascorbic Acid (VITAMIN C) 1000 MG tablet Take 1,000 mg by mouth daily.    Marland Kitchen CARTIA XT 120 MG 24 hr capsule TAKE ONE CAPSULE BY MOUTH EVERY DAY 30 capsule 0  . Cholecalciferol (VITAMIN D3) 1000 units CAPS Take 1 capsule by mouth daily.    Marland Kitchen MAGNESIUM PO Take 1 tablet by mouth daily.      . Multiple Vitamin (MULTIVITAMIN) tablet Take 1 tablet by mouth daily.    Marland Kitchen oseltamivir (TAMIFLU) 75 MG capsule Take 1 capsule (75 mg total) by mouth 2 (two) times daily. 10 capsule 0  . Diclofenac Sodium (PENNSAID) 2 % SOLN Apply 1 pump twice daily. (Patient not taking: Reported on 05/27/2016) 112 g 3  . meloxicam (MOBIC) 15 MG tablet Take 1 tablet (15 mg total) by mouth daily. (Patient not taking: Reported on 05/27/2016) 30 tablet 0   No facility-administered medications prior to visit.     Review of Systems;  Patient denies headache, fevers, malaise, unintentional weight loss, skin rash, eye pain, sinus congestion and sinus pain, sore throat, dysphagia,  hemoptysis , cough, dyspnea, wheezing, chest  pain, palpitations, orthopnea, edema, abdominal pain, nausea, melena, diarrhea, constipation, flank pain, dysuria, hematuria, urinary  Frequency, nocturia, numbness, tingling, seizures,  Focal weakness, Loss of consciousness,  Tremor, insomnia, depression, anxiety, and suicidal ideation.      Objective:  BP (!) 148/84   Pulse (!) 59   Resp 16   Wt 153 lb (69.4 kg)   SpO2 98%   BMI 25.46 kg/m   BP Readings from Last 3 Encounters:  05/27/16 (!) 148/84  05/06/16 118/82  04/07/16 112/80    Wt Readings from Last 3 Encounters:  05/27/16 153 lb (69.4 kg)  05/06/16 150 lb (68 kg)  04/07/16 154 lb (69.9 kg)    General appearance: alert, cooperative and appears stated age Ears: normal TM's and external ear canals both ears Throat: lips, mucosa, and tongue normal; teeth and gums normal Neck: no adenopathy, no carotid bruit, supple, symmetrical, trachea midline and thyroid not enlarged, symmetric, no tenderness/mass/nodules Back: symmetric, no curvature. ROM normal. No CVA tenderness. Lungs: clear to auscultation bilaterally Heart: regular rate and rhythm, S1, S2 normal, no murmur, click, rub or gallop Abdomen: soft, non-tender; bowel sounds normal; no masses,  no organomegaly Pulses: 2+ and symmetric Skin: Skin color, texture, turgor normal. No rashes or lesions Lymph nodes: Cervical, supraclavicular, and axillary nodes normal.  Lab Results  Component Value Date   HGBA1C 5.6 09/08/2013   HGBA1C  5.9 04/27/2011    Lab Results  Component Value Date   CREATININE 0.77 05/27/2016   CREATININE 0.71 10/08/2015   CREATININE 0.69 01/04/2015    Lab Results  Component Value Date   WBC 5.3 01/04/2015   HGB 13.4 01/04/2015   HCT 39.6 01/04/2015   PLT 224.0 01/04/2015   GLUCOSE 97 05/27/2016   CHOL 231 (H) 10/08/2015   TRIG 112.0 10/08/2015   HDL 106.90 10/08/2015   LDLCALC 101 (H) 10/08/2015   ALT 12 10/08/2015   AST 17 10/08/2015   NA 141 05/27/2016   K 3.9 05/27/2016   CL  102 05/27/2016   CREATININE 0.77 05/27/2016   BUN 11 05/27/2016   CO2 33 (H) 05/27/2016   TSH 2.73 10/08/2015   HGBA1C 5.6 09/08/2013   MICROALBUR 0.1 05/27/2016    Dg Shoulder Left  Result Date: 05/21/2015 CLINICAL DATA:  Left shoulder pain for 1 year EXAM: LEFT SHOULDER - 2+ VIEW COMPARISON:  None. FINDINGS: Mild acromioclavicular joint hypertrophy. No fracture or dislocation. Mild calcific tendinitis at the greater tuberosity. IMPRESSION: Mild degenerative changes with no acute findings Electronically Signed   By: Skipper Cliche M.D.   On: 05/21/2015 16:41    Assessment & Plan:   Problem List Items Addressed This Visit    Elevated blood pressure reading without diagnosis of hypertension    She has no history of hypertension but has an elevated readings.  She has been asked to check her pressures at home and submit readings for evaluation. Renal function was checked today.  Lab Results  Component Value Date   CREATININE 0.77 05/27/2016   Lab Results  Component Value Date   NA 141 05/27/2016   K 3.9 05/27/2016   CL 102 05/27/2016   CO2 33 (H) 05/27/2016         Hyperkalemia - Primary    Not found on today's labs,  Reassurance provided that the previously noted elevation may have  been due to the way the blood was handled during collection       Relevant Orders   Basic metabolic panel (Completed)    Other Visit Diagnoses    Elevated blood pressure reading in office without diagnosis of hypertension       Relevant Orders   Microalbumin / creatinine urine ratio (Completed)      I have discontinued Ms. Alexis Bradley's Diclofenac Sodium, meloxicam, and oseltamivir. I am also having her maintain her MAGNESIUM PO, multivitamin, vitamin C, Vitamin D3, CARTIA XT, and Magnesium.  Meds ordered this encounter  Medications  . Magnesium 250 MG TABS    Sig: Take 1 tablet by mouth daily.    Medications Discontinued During This Encounter  Medication Reason  . oseltamivir (TAMIFLU)  75 MG capsule Completed Course  . meloxicam (MOBIC) 15 MG tablet   . Diclofenac Sodium (PENNSAID) 2 % SOLN     Follow-up: No Follow-up on file.   Crecencio Mc, MD

## 2016-05-27 NOTE — Progress Notes (Signed)
Pre visit review using our clinic review tool, if applicable. No additional management support is needed unless otherwise documented below in the visit note. 

## 2016-05-28 LAB — MICROALBUMIN / CREATININE URINE RATIO
CREATININE, U: 20.1 mg/dL
MICROALB UR: 0.1 mg/dL (ref 0.0–1.9)
MICROALB/CREAT RATIO: 0.5 mg/g (ref 0.0–30.0)

## 2016-05-28 LAB — BASIC METABOLIC PANEL
BUN: 11 mg/dL (ref 6–23)
CHLORIDE: 102 meq/L (ref 96–112)
CO2: 33 meq/L — AB (ref 19–32)
Calcium: 9.6 mg/dL (ref 8.4–10.5)
Creatinine, Ser: 0.77 mg/dL (ref 0.40–1.20)
GFR: 83.44 mL/min (ref >60.00)
Glucose, Bld: 97 mg/dL (ref 70–99)
POTASSIUM: 3.9 meq/L (ref 3.5–5.1)
Sodium: 141 mEq/L (ref 135–145)

## 2016-05-29 ENCOUNTER — Encounter: Payer: Self-pay | Admitting: Internal Medicine

## 2016-05-29 ENCOUNTER — Other Ambulatory Visit: Payer: Self-pay | Admitting: Cardiology

## 2016-05-30 DIAGNOSIS — R030 Elevated blood-pressure reading, without diagnosis of hypertension: Secondary | ICD-10-CM | POA: Insufficient documentation

## 2016-05-30 DIAGNOSIS — E875 Hyperkalemia: Secondary | ICD-10-CM | POA: Insufficient documentation

## 2016-05-30 NOTE — Assessment & Plan Note (Signed)
Not found on today's labs,  Reassurance provided that the previously noted elevation may have  been due to the way the blood was handled during collection

## 2016-05-30 NOTE — Assessment & Plan Note (Signed)
She has no history of hypertension but has an elevated readings.  She has been asked to check her pressures at home and submit readings for evaluation. Renal function was checked today.  Lab Results  Component Value Date   CREATININE 0.77 05/27/2016   Lab Results  Component Value Date   NA 141 05/27/2016   K 3.9 05/27/2016   CL 102 05/27/2016   CO2 33 (H) 05/27/2016

## 2016-06-01 ENCOUNTER — Encounter: Payer: Self-pay | Admitting: Internal Medicine

## 2016-06-03 ENCOUNTER — Other Ambulatory Visit: Payer: Self-pay | Admitting: Internal Medicine

## 2016-06-03 MED ORDER — DILTIAZEM HCL ER COATED BEADS 120 MG PO CP24: 120.0000 mg | ORAL_CAPSULE | Freq: Every day | ORAL | 11 refills | Status: DC

## 2016-06-11 ENCOUNTER — Encounter: Payer: Self-pay | Admitting: Internal Medicine

## 2016-06-11 MED ORDER — DILTIAZEM HCL ER COATED BEADS 120 MG PO CP24
120.0000 mg | ORAL_CAPSULE | Freq: Every day | ORAL | 11 refills | Status: DC
Start: 2016-06-11 — End: 2016-06-12

## 2016-06-12 ENCOUNTER — Other Ambulatory Visit: Payer: Self-pay

## 2016-06-12 MED ORDER — DILTIAZEM HCL ER COATED BEADS 120 MG PO CP24: 120.0000 mg | ORAL_CAPSULE | Freq: Every day | ORAL | 11 refills | Status: DC

## 2016-06-30 ENCOUNTER — Encounter: Payer: Self-pay | Admitting: Cardiovascular Disease

## 2016-06-30 ENCOUNTER — Ambulatory Visit (INDEPENDENT_AMBULATORY_CARE_PROVIDER_SITE_OTHER): Payer: BC Managed Care – PPO | Admitting: Cardiovascular Disease

## 2016-06-30 VITALS — BP 140/68 | HR 55 | Ht 64.0 in | Wt 154.2 lb

## 2016-06-30 DIAGNOSIS — R002 Palpitations: Secondary | ICD-10-CM | POA: Diagnosis not present

## 2016-06-30 NOTE — Patient Instructions (Addendum)
Medication Instructions:  Your physician recommends that you continue on your current medications as directed. Please refer to the Current Medication list given to you today.   Labwork: none  Testing/Procedures: Your physician has recommended that you wear a holter monitor. Holter monitors are medical devices that record the heart's electrical activity. Doctors most often use these monitors to diagnose arrhythmias. Arrhythmias are problems with the speed or rhythm of the heartbeat. The monitor is a small, portable device. You can wear one while you do your normal daily activities. This is usually used to diagnose what is causing palpitations/syncope (passing out).    Follow-Up: Your physician wants you to follow-up in: 6 months with Dr. Fletcher Anon.  You will receive a reminder letter in the mail two months in advance. If you don't receive a letter, please call our office to schedule the follow-up appointment.   Any Other Special Instructions Will Be Listed Below (If Applicable). [employer] Instant EKG may be purchased on line ([employer], etc)    If you need a refill on your cardiac medications before your next appointment, please call your pharmacy.   Holter Monitoring A Holter monitor is a small device that is used to detect abnormal heart rhythms. It clips to your clothing and is connected by wires to flat, sticky disks (electrodes) that attach to your chest. It is worn continuously for 24-48 hours. Follow these instructions at home:  Wear your Holter monitor at all times, even while exercising and sleeping, for as long as directed by your health care provider.  Make sure that the Holter monitor is safely clipped to your clothing or close to your body as recommended by your health care provider.  Do not get the monitor or wires wet.  Do not put body lotion or moisturizer on your chest.  Keep your skin clean.  Keep a diary of your daily activities, such as walking and doing chores. If  you feel that your heartbeat is abnormal or that your heart is fluttering or skipping a beat:  Record what you are doing when it happens.  Record what time of day the symptoms occur.  Return your Holter monitor as directed by your health care provider.  Keep all follow-up visits as directed by your health care provider. This is important. Get help right away if:  You feel lightheaded or you faint.  You have trouble breathing.  You feel pain in your chest, upper arm, or jaw.  You feel sick to your stomach and your skin is pale, cool, or damp.  You heartbeat feels unusual or abnormal. This information is not intended to replace advice given to you by your health care provider. Make sure you discuss any questions you have with your health care provider. Document Released: 01/10/2004 Document Revised: 09/19/2015 Document Reviewed: 11/20/2013 Elsevier Interactive Patient Education  2017 [employer].

## 2016-06-30 NOTE — Progress Notes (Signed)
Cardiology Office Note   Date:  07/02/2016   ID:  Alexis Bradley, DOB 1966-04-21, MRN [id number]  PCP:  Crecencio Mc, MD  Cardiologist:   Kathlyn Sacramento, MD   Chief Complaint  Patient presents with  . other    follow up. Patient was last seen in 2016.  Meds reviewed verbally with patient.       History of Present Illness: Alexis Bradley is a 51 y.o. female who presents for A follow-up visit regarding palpitations. She had previous atrial tachycardia and PVCs. She had cardiac catheterization 2009 which showed no significant coronary artery disease. EF is normal. She was treated in the past with metoprolol but she developed fatigue and bradycardia. She was switched to diltiazem with subsequent improvement. She noticed increased palpitations recently mostly at night. She feels skipping as well as fast heartbeats. No chest pain or shortness of breath. No syncope or presyncope. Her father committed suicide last year at the age of 87 and she has been extremely stressed since then.    Past Medical History:  Diagnosis Date  . 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 Fatigue   . Gastric polyps March 2012   Endoscopy  . GERD (gastroesophageal reflux disease) Feb 2011   esophagitis by EGD Feb 2011  . Hiatal hernia   . Palpitations    a. Holter 2009 with PVCs and bigeminy. b. Holter 1/12 with PVCs and short runs of atrial tachycardia (up to 9 beats). c. Holter 12/2014: rare PACs, rare short runs of narrow complex tachycardia c/w atrial tachycardia.  Marland Kitchen PAT (paroxysmal atrial tachycardia) ([place])   . Poor circulation of extremity    Bil legs  . Premature atrial contractions   . PVC's (premature ventricular contractions)   . S/P left heart catheterization by percutaneous approach 2009   a. cath 11/2007: minor luminal  irregularities, EF 60%.  . Sinus bradycardia    a. HR occasionally 40s per patient, rare dips to 30s.  . Varicose veins     Past Surgical History:  Procedure Laterality Date  . ABDOMINAL HYSTERECTOMY    . CARDIAC CATHETERIZATION  2009   Left heart cath  . CESAREAN SECTION    . CHOLECYSTECTOMY    . Right GSV ELAS with 10-20 stab phlebectomies right calf  05-25-11  . VESICOVAGINAL FISTULA CLOSURE W/ TAH       Current Outpatient Prescriptions  Medication Sig Dispense Refill  . Ascorbic Acid (VITAMIN C) 1000 MG tablet Take 1,000 mg by mouth daily.    . Cholecalciferol (VITAMIN D3) 1000 units CAPS Take 1 capsule by mouth daily.    Marland Kitchen diltiazem (CARTIA XT) 120 MG 24 hr capsule Take 1 capsule (120 mg total) by mouth daily. * 30 capsule 11  . MAGNESIUM PO Take 1 tablet by mouth daily.      . Multiple Vitamin (MULTIVITAMIN) tablet Take 1 tablet by mouth daily.     No current facility-administered medications for this visit.     Allergies:   Tape    Social History:  The patient  reports that she has never smoked. She has never used smokeless tobacco. She reports that she drinks about 1.5 oz of alcohol per week . She reports that she does not use drugs.   Family History:  The patient's family history includes Breast cancer in her maternal  grandmother, paternal grandmother, and sister; Heart disease in her paternal grandfather; Stroke in her other.    ROS:  Please see the history of present illness.   Otherwise, review of systems are positive for none.   All other systems are reviewed and negative.    PHYSICAL EXAM: VS:  BP 140/68 (BP Location: Left Arm, Patient Position: Sitting, Cuff Size: Normal)   Pulse (!) 55   Ht 5\' 4"  (1.626 m)   Wt 154 lb 4 oz (70 kg)   BMI 26.48 kg/m  , BMI Body mass index is 26.48 kg/m. GEN: Well nourished, well developed, in no acute distress  HEENT: normal  Neck: no JVD, carotid bruits, or masses Cardiac: RRR; no rubs, or gallops,no edema . 2/6  systolic ejection murmur in the aortic area. Respiratory:  clear to auscultation bilaterally, normal work of breathing GI: soft, nontender, nondistended, + BS MS: no deformity or atrophy  Skin: warm and dry, no rash Neuro:  Strength and sensation are intact Psych: euthymic mood, full affect   EKG:  EKG is ordered today. The ekg ordered today demonstrates normal sinus rhythm with possible left atrial enlargement. No significant ST or T wave changes.   Recent Labs: 10/08/2015: ALT 12; TSH 2.73 05/27/2016: BUN 11; Creatinine, Ser 0.77; Potassium 3.9; Sodium 141    Lipid Panel    Component Value Date/Time   CHOL 231 (H) 10/08/2015 1519   TRIG 112.0 10/08/2015 1519   HDL 106.90 10/08/2015 1519   CHOLHDL 2 10/08/2015 1519   VLDL 22.4 10/08/2015 1519   LDLCALC 101 (H) 10/08/2015 1519      Wt Readings from Last 3 Encounters:  06/30/16 154 lb 4 oz (70 kg)  05/27/16 153 lb (69.4 kg)  05/06/16 150 lb (68 kg)       No flowsheet data found.    ASSESSMENT AND PLAN:  1.  Palpitations: The patient complains of increased palpitations recently mostly at bedtime. I requested a 48-hour Holter monitor. Continue treatment with diltiazem. I asked her to consider obtaining a Smart phone monitor in order to correlate more accurately with her symptoms. I suspect that stress and anxiety contributing to her symptoms.  2. Mild valvular heart disease. She does have a systolic murmur in the aortic area. Previous echocardiogram in 2016 showed normal LV systolic function, mild to moderate aortic regurgitation and mild mitral regurgitation. Continue to monitor clinically.  Disposition:   FU with me in 6 months  Signed,  Kathlyn Sacramento, MD  07/02/2016 11:00 AM    [place]

## 2016-07-15 ENCOUNTER — Ambulatory Visit: Payer: BC Managed Care – PPO

## 2016-07-15 ENCOUNTER — Other Ambulatory Visit: Payer: Self-pay

## 2016-07-15 ENCOUNTER — Ambulatory Visit (INDEPENDENT_AMBULATORY_CARE_PROVIDER_SITE_OTHER): Payer: BC Managed Care – PPO

## 2016-07-15 DIAGNOSIS — R002 Palpitations: Secondary | ICD-10-CM

## 2016-07-24 ENCOUNTER — Other Ambulatory Visit: Payer: Self-pay

## 2016-07-24 MED ORDER — DILTIAZEM HCL ER COATED BEADS 180 MG PO CP24: 180.0000 mg | ORAL_CAPSULE | Freq: Every day | ORAL | 3 refills | Status: DC

## 2016-07-27 ENCOUNTER — Other Ambulatory Visit: Payer: Self-pay

## 2016-07-27 MED ORDER — DILTIAZEM HCL ER COATED BEADS 180 MG PO CP24: 180.0000 mg | ORAL_CAPSULE | Freq: Every day | ORAL | 3 refills | Status: DC

## 2017-01-07 ENCOUNTER — Ambulatory Visit (INDEPENDENT_AMBULATORY_CARE_PROVIDER_SITE_OTHER)
Admission: RE | Admit: 2017-01-07 | Discharge: 2017-01-07 | Disposition: A | Discharge status: Home / Self Care | Payer: BC Managed Care – PPO | Source: Ambulatory Visit | Attending: Family Medicine | Admitting: Family Medicine

## 2017-01-07 ENCOUNTER — Ambulatory Visit: Payer: Self-pay

## 2017-01-07 ENCOUNTER — Encounter: Payer: Self-pay | Admitting: Family Medicine

## 2017-01-07 ENCOUNTER — Ambulatory Visit (INDEPENDENT_AMBULATORY_CARE_PROVIDER_SITE_OTHER): Payer: BC Managed Care – PPO | Admitting: Family Medicine

## 2017-01-07 VITALS — BP 130/80 | HR 65 | Ht 63.0 in | Wt 158.0 lb

## 2017-01-07 DIAGNOSIS — M75112 Incomplete rotator cuff tear or rupture of left shoulder, not specified as traumatic: Secondary | ICD-10-CM

## 2017-01-07 DIAGNOSIS — M25512 Pain in left shoulder: Secondary | ICD-10-CM

## 2017-01-07 DIAGNOSIS — M75102 Unspecified rotator cuff tear or rupture of left shoulder, not specified as traumatic: Secondary | ICD-10-CM | POA: Insufficient documentation

## 2017-01-07 DIAGNOSIS — S20211A Contusion of right front wall of thorax, initial encounter: Secondary | ICD-10-CM

## 2017-01-07 DIAGNOSIS — R0781 Pleurodynia: Secondary | ICD-10-CM

## 2017-01-07 MED ORDER — TRAMADOL HCL 50 MG PO TABS: 50.0000 mg | ORAL_TABLET | Freq: Two times a day (BID) | ORAL | 0 refills | Status: DC | PRN

## 2017-01-07 MED ORDER — VITAMIN D (ERGOCALCIFEROL) 1.25 MG (50000 UNIT) PO CAPS
50000.0000 [IU] | ORAL_CAPSULE | ORAL | 0 refills | Status: DC
Start: 2017-01-07 — End: 2017-03-29

## 2017-01-07 MED ORDER — NITROGLYCERIN 0.2 MG/HR TD PT24: MEDICATED_PATCH | TRANSDERMAL | 1 refills | Status: DC

## 2017-01-07 NOTE — Assessment & Plan Note (Signed)
Patient does have what appears to be more of a incomplete tear noted. Discussed with patient at great length, icing regimen, start a nitroglycerin and warned of potential side effects. Patient will try to increase activity as tolerated. Follow-up again in 3-4 weeks. X-rays ordered today to further evaluate. In do believe that there was a traumatic event that likely contributed to this. No retraction at this time some possible conservative healing.

## 2017-01-07 NOTE — Assessment & Plan Note (Signed)
X-rays to further evaluate for possible fracture

## 2017-01-07 NOTE — Patient Instructions (Addendum)
Good to see you  Xray downstairs Arnica lotion daily to elbow over the counter For the rib tramadol at night Take 10 breaths deep every hour.  For the shoulder  Ice 20 minutes 2 times daily. Usually after activity and before bed. Exercises 3 times a week.  Nitroglycerin Protocol   Apply 1/4 nitroglycerin patch to affected area daily.  Change position of patch within the affected area every 24 hours.  You may experience a headache during the first 1-2 weeks of using the patch, these should subside.  If you experience headaches after beginning nitroglycerin patch treatment, you may take your preferred over the counter pain reliever.  Another side effect of the nitroglycerin patch is skin irritation or rash related to patch adhesive.  Please notify our office if you develop more severe headaches or rash, and stop the patch.  Tendon healing with nitroglycerin patch may require 12 to 24 weeks depending on the extent of injury.  Men should not use if taking Viagra, Cialis, or Levitra.   Do not use if you have migraines or rosacea.  See me again in 3 weeks.

## 2017-01-07 NOTE — Progress Notes (Signed)
[facility] [address], [place] [address] Phone: [phone] Subjective:    I'm seeing this patient by the request  of:    CC: Left shoulder pain  [id number]  Alexis Bradley is a 51 y.o. female coming in with complaint of left shoulder pain. Has had 2 Cortizone shots for the shoulder. The shoulder started bothering her about 4 months ago and she is having trouble lifting her arm. She has been taking Ib profen for pain. She fell off a ladder 2 weeks ago and has pain on her right side near the lower ribs. She is having trouble sleeping at night. Right elbow is also bruised.   Onset- after patient's fall. Location- Upper back behind the shoulder. Pain shoots down her arm. Pain on her rib when breathing Duration-  Character-dull, throbbing aching pain Aggravating factors- can't sleep Reliving factors- Ib profen Therapies tried-  Has tried ice for the shoulder in the past Severity- 6 (shoulder when moving)     Past Medical History:  Diagnosis Date  . 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 Fatigue   . Gastric polyps March 2012   Endoscopy  . GERD (gastroesophageal reflux disease) Feb 2011   esophagitis by EGD Feb 2011  . Hiatal hernia   . Palpitations    a. Holter 2009 with PVCs and bigeminy. b. Holter 1/12 with PVCs and short runs of atrial tachycardia (up to 9 beats). c. Holter 12/2014: rare PACs, rare short runs of narrow complex tachycardia c/w atrial tachycardia.  Marland Kitchen PAT (paroxysmal atrial tachycardia) ([place])   . Poor circulation of extremity    Bil legs  . Premature atrial contractions   . PVC's (premature ventricular contractions)   . S/P left heart catheterization by percutaneous approach 2009   a. cath 11/2007: minor luminal irregularities, EF 60%.  . Sinus bradycardia     a. HR occasionally 40s per patient, rare dips to 30s.  . Varicose veins    Past Surgical History:  Procedure Laterality Date  . ABDOMINAL HYSTERECTOMY    . CARDIAC CATHETERIZATION  2009   Left heart cath  . CESAREAN SECTION    . CHOLECYSTECTOMY    . Right GSV ELAS with 10-20 stab phlebectomies right calf  05-25-11  . VESICOVAGINAL FISTULA CLOSURE W/ TAH     Social History   Social History  . Marital status: Married    Spouse name: N/A  . Number of children: N/A  . Years of education: N/A   Occupational History  . 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 Smoking status: Never Smoker  . Smokeless tobacco: Never Used  . Alcohol use 1.5 oz/week    3 Standard drinks or equivalent per week     Comment: Rarely  . Drug use: No  . Sexual activity: Not on file   Other Topics Concern  . Not on file   Social History Narrative   Married   Allergies  Allergen Reactions  . Tape Hives and Other (See Comments)    redness   Family History  Problem Relation Age of Onset  . Breast cancer Sister   . Breast cancer Maternal Grandmother   . Breast cancer Paternal Grandmother   . Heart disease Paternal Grandfather   .  Stroke Other        Grandfather  . Coronary artery disease Neg Hx        Premature     Past medical history, social, surgical and family history all reviewed in electronic medical record.  No pertanent information unless stated regarding to the chief complaint.   Review of Systems:Review of systems updated and as accurate as of 01/07/17  No headache, visual changes, nausea, vomiting, diarrhea, constipation, dizziness, abdominal pain, skin rash, fevers, chills, night sweats, weight loss, swollen lymph nodes, body aches, joint swelling,chest pain, shortness of breath, mood changes. Positive muscle aches  Objective  There were no vitals taken for this visit. Systems examined below as of  01/07/17   General: No apparent distress alert and oriented x3 mood and affect normal, dressed appropriately.  HEENT: Pupils equal, extraocular movements intact  Respiratory: Patient's speak in full sentences and does not appear short of breath  Cardiovascular: No lower extremity edema, non tender, no erythema  Skin: Warm dry intact with no signs of infection or rash on extremities or on axial skeleton.  Abdomen: Soft patient though is tender over the anterior lateral aspect of the right rib cage mostly over T8-T9 Neuro: Cranial nerves II through XII are intact, neurovascularly intact in all extremities with 2+ DTRs and 2+ pulses.  Lymph: No lymphadenopathy of posterior or anterior cervical chain or axillae bilaterally.  Gait normal with good balance and coordination.  MSK:  Non tender with full range of motion and good stability and symmetric strength and tone of , wrist, hip, knee and ankles bilaterally.  Right elbow shows bruising that seems to be resolving the patient does have full range of motion. Shoulder: left Inspection reveals no abnormalities, atrophy or asymmetry. Palpation is normal with no tenderness over AC joint or bicipital groove. ROM is full in all planes passively. Rotator cuff strength normal throughout. signs of impingement with positive Neer and Hawkin's tests, but negative empty can sign. Speeds and Yergason's tests normal. No labral pathology noted with negative Obrien's, negative clunk and good stability. Normal scapular function observed. No painful arc and no drop arm sign. No apprehension sign  MSK US performed of: left This study was ordered, performed, and interpreted by Charlann Boxer D.O.  Shoulder:   Supraspinatus:  Intersubstance tears of the supraspinatus noted. Seems to be an acute on chronic. Infraspinatus:  Appears normal on long and transverse views. Significant increase in Doppler flow Subscapularis:  Appears normal on long and transverse views.  Positive bursa Teres Minor:  Appears normal on long and transverse views. AC joint:  Mild osteoarthritic changes Glenohumeral Joint:  Appears normal without effusion. Possible old injury noted. Glenoid Labrum:  Intact without visualized tears. Biceps Tendon:  Appears normal on long and transverse views, no fraying of tendon, tendon located in intertubercular groove, no subluxation with shoulder internal or external rotation.  Impression: Intersubstance tear of the supraspinatus  Procedure: Real-time Ultrasound Guided Injection of left glenohumeral joint Device: GE Logiq E  Ultrasound guided injection is preferred based studies that show increased duration, increased effect, greater accuracy, decreased procedural pain, increased response rate with ultrasound guided versus blind injection.  Verbal informed consent obtained.  Time-out conducted.  Noted no overlying erythema, induration, or other signs of local infection.  Skin prepped in a sterile fashion.  Local anesthesia: Topical Ethyl chloride.  With sterile technique and under real time ultrasound guidance:  Joint visualized.  23g 1  inch needle inserted posterior approach. Pictures taken for  needle placement. Patient did have injection of 2 cc of 1% lidocaine, 2 cc of 0.5% Marcaine, and 1.0 cc of Kenalog 40 mg/dL. Completed without difficulty  Pain immediately resolved suggesting accurate placement of the medication.  Advised to call if fevers/chills, erythema, induration, drainage, or persistent bleeding.  Images permanently stored and available for review in the ultrasound unit.  Impression: Technically successful ultrasound guided injection.  Procedure 09326; 15 additional minutes spent for Therapeutic exercises as stated in above notes.  This included exercises focusing on stretching, strengthening, with significant focus on eccentric aspects.   Long term goals include an improvement in range of motion, strength, endurance as well as  avoiding reinjury. Patient's frequency would include in 1-2 times a day, 3-5 times a week for a duration of 6-12 weeks. Shoulder Exercises that included:  Basic scapular stabilization to include adduction and depression of scapula Scaption, focusing on proper movement and good control Internal and External rotation utilizing a theraband which was given. , with elbow tucked at side entire time Rows with theraband   Proper technique shown and discussed handout in great detail with ATC.  All questions were discussed and answered.  '   Impression and Recommendations:     This case required medical decision making of moderate complexity.      Note: This dictation was prepared with Dragon dictation along with smaller phrase technology. Any transcriptional errors that result from this process are unintentional.

## 2017-01-11 ENCOUNTER — Ambulatory Visit: Payer: BC Managed Care – PPO | Admitting: Family Medicine

## 2017-01-28 ENCOUNTER — Encounter: Payer: Self-pay | Admitting: Family Medicine

## 2017-01-28 ENCOUNTER — Ambulatory Visit (INDEPENDENT_AMBULATORY_CARE_PROVIDER_SITE_OTHER): Payer: BC Managed Care – PPO | Admitting: Family Medicine

## 2017-01-28 VITALS — BP 110/76 | HR 62 | Ht 64.0 in | Wt 158.0 lb

## 2017-01-28 DIAGNOSIS — M25512 Pain in left shoulder: Secondary | ICD-10-CM | POA: Diagnosis not present

## 2017-01-28 DIAGNOSIS — M75112 Incomplete rotator cuff tear or rupture of left shoulder, not specified as traumatic: Secondary | ICD-10-CM

## 2017-01-28 DIAGNOSIS — G8929 Other chronic pain: Secondary | ICD-10-CM

## 2017-01-28 NOTE — Progress Notes (Signed)
[name] [address] [place], [place] [address] Phone: [phone] Subjective:      CC: Left shoulder follow-up  [id number]  Alexis Bradley is a 51 y.o. female coming in for left shoulder pain. She said that the injection last visit helped. She said that she has been doing the exercises we prescribed but she still feels week. She notes that her range of motion is better but still not the same as the contralateral arm. Patient was found to have a rotator cuff tear.making some progress overall. Less painful. No side effects to the nitroglycerin.       Past Medical History:  Diagnosis Date  . 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 Fatigue   . Gastric polyps March 2012   Endoscopy  . GERD (gastroesophageal reflux disease) Feb 2011   esophagitis by EGD Feb 2011  . Hiatal hernia   . Palpitations    a. Holter 2009 with PVCs and bigeminy. b. Holter 1/12 with PVCs and short runs of atrial tachycardia (up to 9 beats). c. Holter 12/2014: rare PACs, rare short runs of narrow complex tachycardia c/w atrial tachycardia.  Marland Kitchen PAT (paroxysmal atrial tachycardia) ([place])   . Poor circulation of extremity    Bil legs  . Premature atrial contractions   . PVC's (premature ventricular contractions)   . S/P left heart catheterization by percutaneous approach 2009   a. cath 11/2007: minor luminal irregularities, EF 60%.  . Sinus bradycardia    a. HR occasionally 40s per patient, rare dips to 30s.  . Varicose veins    Past Surgical History:  Procedure Laterality Date  . ABDOMINAL HYSTERECTOMY    . CARDIAC CATHETERIZATION  2009   Left heart cath  . CESAREAN SECTION    . CHOLECYSTECTOMY    . Right GSV ELAS with 10-20 stab phlebectomies right calf  05-25-11  . VESICOVAGINAL FISTULA CLOSURE W/ TAH      Social History   Social History  . Marital status: Married    Spouse name: N/A  . Number of children: N/A  . Years of education: N/A   Occupational History  . 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 Smoking status: Never Smoker  . Smokeless tobacco: Never Used  . Alcohol use 1.5 oz/week    3 Standard drinks or equivalent per week     Comment: Rarely  . Drug use: No  . Sexual activity: Not Asked   Other Topics Concern  . None   Social History Narrative   Married   Allergies  Allergen Reactions  . Tape Hives and Other (See Comments)    redness   Family History  Problem Relation Age of Onset  . Breast cancer Sister   . Breast cancer Maternal Grandmother   . Breast cancer Paternal Grandmother   . Heart disease Paternal Grandfather   . Stroke Other        Grandfather  . Coronary artery disease Neg Hx        Premature     Past medical history, social, surgical and family history all reviewed in electronic medical record.  No pertanent information unless stated regarding to the chief complaint.   Review of Systems:Review of systems updated and as accurate as  of 01/28/17  No headache, visual changes, nausea, vomiting, diarrhea, constipation, dizziness, abdominal pain, skin rash, fevers, chills, night sweats, weight loss, swollen lymph nodes, body aches, joint swelling, muscle aches, chest pain, shortness of breath, mood changes.   Objective  Blood pressure 110/76, pulse 62, height 5\' 4"  (1.626 m), weight 158 lb (71.7 kg), SpO2 99 %. Systems examined below as of 01/28/17   General: No apparent distress alert and oriented x3 mood and affect normal, dressed appropriately.  HEENT: Pupils equal, extraocular movements intact  Respiratory: Patient's speak in full sentences and does not appear short of breath  Cardiovascular: No lower extremity edema, non tender, no erythema  Skin: Warm dry  intact with no signs of infection or rash on extremities or on axial skeleton.  Abdomen: Soft nontender  Neuro: Cranial nerves II through XII are intact, neurovascularly intact in all extremities with 2+ DTRs and 2+ pulses.  Lymph: No lymphadenopathy of posterior or anterior cervical chain or axillae bilaterally.  Gait normal with good balance and coordination.  MSK:  Non tender with full range of motion and good stability and symmetric strength and tone of  elbows, wrist, hip, knee and ankles bilaterally.  Shoulder: Left Inspection reveals no abnormalities, atrophy or asymmetry. Palpation is normal with no tenderness over AC joint or bicipital groove. ROM mild limitation and external range of motion Rotator cuff strength 4 out of 5 Positive impingement Speeds and Yergason's tests normal. Mild positive O'Brien's Normal scapular function observed. No painful arc and no drop arm sign. No apprehension sign Contralateral shoulder unremarkable     Impression and Recommendations:     This case required medical decision making of moderate complexity.      Note: This dictation was prepared with Dragon dictation along with smaller phrase technology. Any transcriptional errors that result from this process are unintentional.

## 2017-01-28 NOTE — Patient Instructions (Signed)
Good to see you  Overall you are healing PT will be calling you  Continue the nitro patch and the vitamins Ice is your friend Try to keep hands within peripheral vision  See me again in 4-6 weeks.

## 2017-01-28 NOTE — Assessment & Plan Note (Signed)
Patient is a tear but seems to be improving. Didn't respond well to the injection. Has tramadol for breaks or pain. Nitroglycerin patch seems to be helping with no side effects. Do not change the amount of this time. We'll start formal physical therapy. Follow-up again in 6 weeks and ultrasound at follow-up.

## 2017-02-16 ENCOUNTER — Telehealth: Payer: Self-pay

## 2017-02-16 NOTE — Telephone Encounter (Signed)
[facility] called to let us know that they have tried calling patient 3 times. They spoke with patient today and she said that she has an appointment with Dr. Tamala Julian next week and does not want to start physical therapy until this appointment.

## 2017-02-25 ENCOUNTER — Ambulatory Visit (INDEPENDENT_AMBULATORY_CARE_PROVIDER_SITE_OTHER): Payer: BC Managed Care – PPO | Admitting: Family Medicine

## 2017-02-25 ENCOUNTER — Encounter: Payer: Self-pay | Admitting: Family Medicine

## 2017-02-25 DIAGNOSIS — M75112 Incomplete rotator cuff tear or rupture of left shoulder, not specified as traumatic: Secondary | ICD-10-CM | POA: Diagnosis not present

## 2017-02-25 NOTE — Progress Notes (Signed)
[name] [place], [place] [address] Phone: [phone] Subjective:    I'm seeing this patient by the request  of:    CC: left shoulder pain   [id number]  Alexis Bradley is a 51 y.o. female coming in for follow up for left shoulder pain. She still has pain with horizontal abduction but overall feels much better. She continues to do the exercises at home. Patient states that she is 90% better. States that maybe some mild weakness compared to the contralateral side but nothing severe. Very happy with the results of far.      Past Medical History:  Diagnosis Date  . 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 Fatigue   . Gastric polyps March 2012   Endoscopy  . GERD (gastroesophageal reflux disease) Feb 2011   esophagitis by EGD Feb 2011  . Hiatal hernia   . Palpitations    a. Holter 2009 with PVCs and bigeminy. b. Holter 1/12 with PVCs and short runs of atrial tachycardia (up to 9 beats). c. Holter 12/2014: rare PACs, rare short runs of narrow complex tachycardia c/w atrial tachycardia.  Marland Kitchen PAT (paroxysmal atrial tachycardia) ([place])   . Poor circulation of extremity    Bil legs  . Premature atrial contractions   . PVC's (premature ventricular contractions)   . S/P left heart catheterization by percutaneous approach 2009   a. cath 11/2007: minor luminal irregularities, EF 60%.  . Sinus bradycardia    a. HR occasionally 40s per patient, rare dips to 30s.  . Varicose veins    Past Surgical History:  Procedure Laterality Date  . ABDOMINAL HYSTERECTOMY    . CARDIAC CATHETERIZATION  2009   Left heart cath  . CESAREAN SECTION    . CHOLECYSTECTOMY    . Right GSV ELAS with 10-20 stab phlebectomies right calf  05-25-11  . VESICOVAGINAL FISTULA CLOSURE W/ TAH     Social History     Social History  . Marital status: Married    Spouse name: N/A  . Number of children: N/A  . Years of education: N/A   Occupational History  . 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 Smoking status: Never Smoker  . Smokeless tobacco: Never Used  . Alcohol use 1.5 oz/week    3 Standard drinks or equivalent per week     Comment: Rarely  . Drug use: No  . Sexual activity: Not on file   Other Topics Concern  . Not on file   Social History Narrative   Married   Allergies  Allergen Reactions  . Tape Hives and Other (See Comments)    redness   Family History  Problem Relation Age of Onset  . Breast cancer Sister   . Breast cancer Maternal Grandmother   . Breast cancer Paternal Grandmother   . Heart disease Paternal Grandfather   . Stroke Other        Grandfather  . Coronary artery disease Neg Hx        Premature     Past medical history, social, surgical and family history all reviewed in electronic medical record.  No pertanent information unless stated regarding to the chief complaint.   Review of Systems:Review of systems updated and  as accurate as of 02/25/17  No headache, visual changes, nausea, vomiting, diarrhea, constipation, dizziness, abdominal pain, skin rash, fevers, chills, night sweats, weight loss, swollen lymph nodes, body aches, joint swelling, muscle aches, chest pain, shortness of breath, mood changes.   Objective  There were no vitals taken for this visit. Systems examined below as of 02/25/17   General: No apparent distress alert and oriented x3 mood and affect normal, dressed appropriately.  HEENT: Pupils equal, extraocular movements intact  Respiratory: Patient's speak in full sentences and does not appear short of breath  Cardiovascular: No lower extremity edema, non tender, no erythema  Skin: Warm dry intact with no signs of infection or rash on extremities or on  axial skeleton.  Abdomen: Soft nontender  Neuro: Cranial nerves II through XII are intact, neurovascularly intact in all extremities with 2+ DTRs and 2+ pulses.  Lymph: No lymphadenopathy of posterior or anterior cervical chain or axillae bilaterally.  Gait normal with good balance and coordination.  MSK:  Non tender with full range of motion and good stability and symmetric strength and tone of  elbows, wrist, hip, knee and ankles bilaterally.  Shoulder: Left Inspection reveals no abnormalities, atrophy or asymmetry. Palpation is normal with no tenderness over AC joint or bicipital groove. ROM is full in all planes. Rotator cuff strength 4+ out of 5 which is an improvement Mild impingement Speeds and Yergason's tests normal. No labral pathology noted with negative Obrien's, negative clunk and good stability. Normal scapular function observed. No painful arc and no drop arm sign. No apprehension sign Contralateral shoulder unremarkable      Impression and Recommendations:     This case required medical decision making of moderate complexity.      Note: This dictation was prepared with Dragon dictation along with smaller phrase technology. Any transcriptional errors that result from this process are unintentional.

## 2017-02-25 NOTE — Assessment & Plan Note (Signed)
Patient is now 6 weeks out from the injection is doing much better. We will continue with conservative therapy. Patient will titrate off of the nitroglycerin patches. Continue the once weekly vitamin D indefinitely. Follow-up again as needed

## 2017-03-29 ENCOUNTER — Other Ambulatory Visit: Payer: Self-pay | Admitting: Family Medicine

## 2017-04-09 ENCOUNTER — Ambulatory Visit: Payer: BC Managed Care – PPO | Admitting: Cardiovascular Disease

## 2017-04-22 ENCOUNTER — Ambulatory Visit: Payer: BC Managed Care – PPO | Admitting: Cardiovascular Disease

## 2017-06-15 ENCOUNTER — Ambulatory Visit: Payer: BC Managed Care – PPO | Admitting: Cardiovascular Disease

## 2017-06-15 ENCOUNTER — Encounter: Payer: Self-pay | Admitting: Cardiovascular Disease

## 2017-06-15 VITALS — BP 128/66 | HR 63 | Ht 64.0 in | Wt 161.0 lb

## 2017-06-15 DIAGNOSIS — I471 Supraventricular tachycardia: Secondary | ICD-10-CM

## 2017-06-15 DIAGNOSIS — R002 Palpitations: Secondary | ICD-10-CM

## 2017-06-15 NOTE — Progress Notes (Signed)
Cardiology Office Note   Date:  06/15/2017   ID:  Alexis Bradley, DOB Feb 12, 1966, MRN [id number]  PCP:  Crecencio Mc, MD  Cardiologist:   Kathlyn Sacramento, MD   Chief Complaint  Patient presents with  . Other    6 month follow up. patient c/o rapid heartbeats at times. Meds reviewed verbally with patient       History of Present Illness: Alexis Bradley is a 52 y.o. female who presents for a follow-up visit regarding palpitations. She had previous atrial tachycardia and PVCs. She had cardiac catheterization 2009 which showed no significant coronary artery disease. EF is normal. She was treated in the past with metoprolol but she developed fatigue and bradycardia. She was switched to diltiazem with subsequent improvement. She was seen last year for worsening palpitations.  Holter monitor showed short runs of SVT with the longest run lasting 30 seconds and fastest heart rate of 170 bpm.  The dose of diltiazem was increased to 180 mg once daily.  She has been doing well since then with no recent chest pain or shortness of breath.  She continues to have intermittent palpitations that usually do not last more than 20 seconds.  No dizziness, syncope or presyncope.   Past Medical History:  Diagnosis Date  . 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 Fatigue   . Gastric polyps March 2012   Endoscopy  . GERD (gastroesophageal reflux disease) Feb 2011   esophagitis by EGD Feb 2011  . Hiatal hernia   . Palpitations    a. Holter 2009 with PVCs and bigeminy. b. Holter 1/12 with PVCs and short runs of atrial tachycardia (up to 9 beats). c. Holter 12/2014: rare PACs, rare short runs of narrow complex tachycardia c/w atrial tachycardia.  Marland Kitchen PAT (paroxysmal atrial tachycardia) ([place])   . Poor circulation of extremity    Bil legs  .  Premature atrial contractions   . PVC's (premature ventricular contractions)   . S/P left heart catheterization by percutaneous approach 2009   a. cath 11/2007: minor luminal irregularities, EF 60%.  . Sinus bradycardia    a. HR occasionally 40s per patient, rare dips to 30s.  . Varicose veins     Past Surgical History:  Procedure Laterality Date  . ABDOMINAL HYSTERECTOMY    . CARDIAC CATHETERIZATION  2009   Left heart cath  . CESAREAN SECTION    . CHOLECYSTECTOMY    . Right GSV ELAS with 10-20 stab phlebectomies right calf  05-25-11  . VESICOVAGINAL FISTULA CLOSURE W/ TAH       Current Outpatient Medications  Medication Sig Dispense Refill  . Ascorbic Acid (VITAMIN C) 1000 MG tablet Take 1,000 mg by mouth daily.    . Cholecalciferol (VITAMIN D3) 1000 units CAPS Take 1 capsule by mouth daily.    Marland Kitchen diltiazem (CARDIZEM CD) 180 MG 24 hr capsule Take 1 capsule (180 mg total) by mouth daily. * 90 capsule 3  . MAGNESIUM PO Take 1 tablet by mouth daily.      . Multiple Vitamin (MULTIVITAMIN) tablet Take 1 tablet by mouth daily.     No current facility-administered medications for this visit.     Allergies:   Tape    Social History:  The patient  reports that  has never smoked. she has never used smokeless tobacco. She  reports that she drinks about 1.5 oz of alcohol per week. She reports that she does not use drugs.   Family History:  The patient's family history includes Breast cancer in her maternal grandmother, paternal grandmother, and sister; Heart disease in her paternal grandfather; Stroke in her other.    ROS:  Please see the history of present illness.   Otherwise, review of systems are positive for none.   All other systems are reviewed and negative.    PHYSICAL EXAM: VS:  BP 128/66 (BP Location: Left Arm, Patient Position: Sitting, Cuff Size: Normal)   Pulse 63   Ht 5\' 4"  (1.626 m)   Wt 161 lb (73 kg)   BMI 27.64 kg/m  , BMI Body mass index is 27.64 kg/m. GEN:  Well nourished, well developed, in no acute distress  HEENT: normal  Neck: no JVD, carotid bruits, or masses Cardiac: RRR; no rubs, or gallops,no edema . 2/6 systolic ejection murmur in the aortic area. Respiratory:  clear to auscultation bilaterally, normal work of breathing GI: soft, nontender, nondistended, + BS MS: no deformity or atrophy  Skin: warm and dry, no rash Neuro:  Strength and sensation are intact Psych: euthymic mood, full affect   EKG:  EKG is ordered today. The ekg ordered today demonstrates normal sinus rhythm with possible left atrial enlargement. No significant ST or T wave changes.   Recent Labs: No results found for requested labs within last 8760 hours.    Lipid Panel    Component Value Date/Time   CHOL 231 (H) 10/08/2015 1519   TRIG 112.0 10/08/2015 1519   HDL 106.90 10/08/2015 1519   CHOLHDL 2 10/08/2015 1519   VLDL 22.4 10/08/2015 1519   LDLCALC 101 (H) 10/08/2015 1519      Wt Readings from Last 3 Encounters:  06/15/17 161 lb (73 kg)  02/25/17 161 lb (73 kg)  01/28/17 158 lb (71.7 kg)       No flowsheet data found.    ASSESSMENT AND PLAN:  1.  Paroxysmal supraventricular tachycardia: Symptoms are well controlled with diltiazem with no prolonged tachycardia.  Continue low caffeine.  Continue same dose of diltiazem.   2. Mild valvular heart disease. She does have a systolic murmur in the aortic area. Previous echocardiogram in 2016 showed normal LV systolic function, mild to moderate aortic regurgitation and mild mitral regurgitation.  No significant change in her heart murmur.  I will consider a repeat echocardiogram next year.  Disposition:   FU with me in 12 months  Signed,  Kathlyn Sacramento, MD  06/15/2017 4:23 PM    Alexis Bradley

## 2017-06-15 NOTE — Progress Notes (Deleted)
Cardiology Office Note   Date:  06/15/2017   ID:  Alexis Bradley, DOB [birth year], MRN [id number]  PCP:  Alexis Mc, MD  Cardiologist:   Kathlyn Sacramento, MD   No chief complaint on file.     History of Present Illness: Alexis Bradley is a 52 y.o. female who presents for A follow-up visit regarding palpitations. She had previous atrial tachycardia and PVCs. She had cardiac catheterization 2009 which showed no significant coronary artery disease. EF is normal. She was treated in the past with metoprolol but she developed fatigue and bradycardia. She was switched to diltiazem with subsequent improvement. She was seen last year for worsening palpitations.  Holter monitor showed short runs of SVT with the longest run lasting 30 seconds and fastest heart rate of 170 bpm.  The dose of diltiazem was increased to 180 mg once daily.   Past Medical History:  Diagnosis Date  . 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 Fatigue   . Gastric polyps March 2012   Endoscopy  . GERD (gastroesophageal reflux disease) Feb 2011   esophagitis by EGD Feb 2011  . Hiatal hernia   . Palpitations    a. Holter 2009 with PVCs and bigeminy. b. Holter 1/12 with PVCs and short runs of atrial tachycardia (up to 9 beats). c. Holter 12/2014: rare PACs, rare short runs of narrow complex tachycardia c/w atrial tachycardia.  Marland Kitchen PAT (paroxysmal atrial tachycardia) ([place])   . Poor circulation of extremity    Bil legs  . Premature atrial contractions   . PVC's (premature ventricular contractions)   . S/P left heart catheterization by percutaneous approach 2009   a. cath 11/2007: minor luminal irregularities, EF 60%.  . Sinus bradycardia    a. HR occasionally 40s per patient, rare dips to 30s.  . Varicose veins     Past Surgical History:  Procedure  Laterality Date  . ABDOMINAL HYSTERECTOMY    . CARDIAC CATHETERIZATION  2009   Left heart cath  . CESAREAN SECTION    . CHOLECYSTECTOMY    . Right GSV ELAS with 10-20 stab phlebectomies right calf  05-25-11  . VESICOVAGINAL FISTULA CLOSURE W/ TAH       Current Outpatient Medications  Medication Sig Dispense Refill  . Ascorbic Acid (VITAMIN C) 1000 MG tablet Take 1,000 mg by mouth daily.    . Cholecalciferol (VITAMIN D3) 1000 units CAPS Take 1 capsule by mouth daily.    Marland Kitchen diltiazem (CARDIZEM CD) 180 MG 24 hr capsule Take 1 capsule (180 mg total) by mouth daily. * 90 capsule 3  . MAGNESIUM PO Take 1 tablet by mouth daily.      . Multiple Vitamin (MULTIVITAMIN) tablet Take 1 tablet by mouth daily.    . nitroGLYCERIN (NITRODUR - DOSED IN MG/24 HR) 0.2 mg/hr patch 1/4 patch daily 30 patch 1  . traMADol (ULTRAM) 50 MG tablet Take 1 tablet (50 mg total) by mouth every 12 (twelve) hours as needed. 30 tablet 0  . Vitamin D, Ergocalciferol, (DRISDOL) 50000 units CAPS capsule TAKE 1 CAPSULE BY MOUTH EVERY 7 DAYS 12 capsule 0   No current facility-administered medications for this visit.     Allergies:   Tape    Social History:  The patient  reports that  has never smoked. she has never used smokeless tobacco. She  reports that she drinks about 1.5 oz of alcohol per week. She reports that she does not use drugs.   Family History:  The patient's family history includes Breast cancer in her maternal grandmother, paternal grandmother, and sister; Heart disease in her paternal grandfather; Stroke in her other.    ROS:  Please see the history of present illness.   Otherwise, review of systems are positive for none.   All other systems are reviewed and negative.    PHYSICAL EXAM: VS:  There were no vitals taken for this visit. , BMI There is no height or weight on file to calculate BMI. GEN: Well nourished, well developed, in no acute distress  HEENT: normal  Neck: no JVD, carotid bruits, or  masses Cardiac: RRR; no rubs, or gallops,no edema . 2/6 systolic ejection murmur in the aortic area. Respiratory:  clear to auscultation bilaterally, normal work of breathing GI: soft, nontender, nondistended, + BS MS: no deformity or atrophy  Skin: warm and dry, no rash Neuro:  Strength and sensation are intact Psych: euthymic mood, full affect   EKG:  EKG is ordered today. The ekg ordered today demonstrates normal sinus rhythm with possible left atrial enlargement. No significant ST or T wave changes.   Recent Labs: No results found for requested labs within last 8760 hours.    Lipid Panel    Component Value Date/Time   CHOL 231 (H) 10/08/2015 1519   TRIG 112.0 10/08/2015 1519   HDL 106.90 10/08/2015 1519   CHOLHDL 2 10/08/2015 1519   VLDL 22.4 10/08/2015 1519   LDLCALC 101 (H) 10/08/2015 1519      Wt Readings from Last 3 Encounters:  02/25/17 161 lb (73 kg)  01/28/17 158 lb (71.7 kg)  01/07/17 158 lb (71.7 kg)       No flowsheet data found.    ASSESSMENT AND PLAN:  1.  Palpitations: The patient complains of increased palpitations recently mostly at bedtime. I requested a 48-hour Holter monitor. Continue treatment with diltiazem. I asked her to consider obtaining a Smart phone monitor in order to correlate more accurately with her symptoms. I suspect that stress and anxiety contributing to her symptoms.  2. Mild valvular heart disease. She does have a systolic murmur in the aortic area. Previous echocardiogram in 2016 showed normal LV systolic function, mild to moderate aortic regurgitation and mild mitral regurgitation. Continue to monitor clinically.  Disposition:   FU with me in 6 months  Signed,  Kathlyn Sacramento, MD  06/15/2017 3:54 PM    [place]

## 2017-06-15 NOTE — Patient Instructions (Signed)
Medication Instructions: Continue same medications.   Labwork: None.   Procedures/Testing: None.   Follow-Up: 1 year with Dr. Darby Shadwick.   Any Additional Special Instructions Will Be Listed Below (If Applicable).     If you need a refill on your cardiac medications before your next appointment, please call your pharmacy.   

## 2017-07-15 ENCOUNTER — Ambulatory Visit (INDEPENDENT_AMBULATORY_CARE_PROVIDER_SITE_OTHER)
Admission: RE | Admit: 2017-07-15 | Discharge: 2017-07-15 | Disposition: A | Discharge status: Home / Self Care | Payer: BC Managed Care – PPO | Source: Ambulatory Visit | Attending: Family Medicine | Admitting: Family Medicine

## 2017-07-15 ENCOUNTER — Other Ambulatory Visit (INDEPENDENT_AMBULATORY_CARE_PROVIDER_SITE_OTHER): Payer: BC Managed Care – PPO

## 2017-07-15 ENCOUNTER — Encounter: Payer: Self-pay | Admitting: Family Medicine

## 2017-07-15 ENCOUNTER — Ambulatory Visit (INDEPENDENT_AMBULATORY_CARE_PROVIDER_SITE_OTHER): Payer: BC Managed Care – PPO | Admitting: Family Medicine

## 2017-07-15 ENCOUNTER — Ambulatory Visit: Payer: Self-pay

## 2017-07-15 VITALS — BP 126/84 | HR 58 | Ht 64.0 in | Wt 161.0 lb

## 2017-07-15 DIAGNOSIS — G8929 Other chronic pain: Secondary | ICD-10-CM

## 2017-07-15 DIAGNOSIS — M25512 Pain in left shoulder: Secondary | ICD-10-CM | POA: Diagnosis not present

## 2017-07-15 DIAGNOSIS — M2550 Pain in unspecified joint: Secondary | ICD-10-CM | POA: Diagnosis not present

## 2017-07-15 DIAGNOSIS — M542 Cervicalgia: Secondary | ICD-10-CM | POA: Diagnosis not present

## 2017-07-15 DIAGNOSIS — M75112 Incomplete rotator cuff tear or rupture of left shoulder, not specified as traumatic: Secondary | ICD-10-CM | POA: Diagnosis not present

## 2017-07-15 LAB — CBC WITH DIFFERENTIAL/PLATELET
BASOS ABS: 0 10*3/uL (ref 0.0–0.1)
Basophils Relative: 0.6 % (ref 0.0–3.0)
Eosinophils Absolute: 0.1 10*3/uL (ref 0.0–0.7)
Eosinophils Relative: 1.2 % (ref 0.0–5.0)
HCT: 40 % (ref 36.0–46.0)
HEMOGLOBIN: 13.7 g/dL (ref 12.0–15.0)
Lymphocytes Relative: 32.9 % (ref 12.0–46.0)
Lymphs Abs: 2.2 10*3/uL (ref 0.7–4.0)
MCHC: 34.4 g/dL (ref 30.0–36.0)
MCV: 93.3 fl (ref 78.0–100.0)
MONO ABS: 0.5 10*3/uL (ref 0.1–1.0)
MONOS PCT: 6.9 % (ref 3.0–12.0)
Neutro Abs: 3.9 10*3/uL (ref 1.4–7.7)
Neutrophils Relative %: 58.4 % (ref 43.0–77.0)
Platelets: 295 10*3/uL (ref 150.0–400.0)
RBC: 4.28 Mil/uL (ref 3.87–5.11)
RDW: 13.3 % (ref 11.5–15.5)
WBC: 6.7 10*3/uL (ref 4.0–10.5)

## 2017-07-15 LAB — SEDIMENTATION RATE: SED RATE: 17 mm/h (ref 0–30)

## 2017-07-15 LAB — VITAMIN D 25 HYDROXY (VIT D DEFICIENCY, FRACTURES): VITD: 71.1 ng/mL (ref 30.00–100.00)

## 2017-07-15 LAB — TSH: TSH: 4.23 u[IU]/mL (ref 0.35–4.50)

## 2017-07-15 NOTE — Patient Instructions (Signed)
Good to see you  Lets restart everything.  Including the nitro DGet you a new desk Xrays and labs downstairs See me again in 4 weeks

## 2017-07-15 NOTE — Assessment & Plan Note (Signed)
Patient given injection and tolerated the procedure well.  We discussed icing regimen and home exercises.  Topical anti-inflammatories.  Restart nitroglycerin.  Follow-up again in 4 weeks

## 2017-07-15 NOTE — Progress Notes (Signed)
[facility] [address] [name], [name] [address] Phone: [phone] Subjective:     CC: Left shoulder pain  Alexis Bradley:[id number]  Alexis Bradley is a 52 y.o. female coming in with complaint of left shoulder pain.  Patient instructed to respond to conservative therapy.  Was started on nitroglycerin at that time and was 95% better at follow-up back in November.  Patient states that her neck started to bother her 2 weeks ago. Patient states that she does have radiating pain into the left bicep. She is feeling pain on the right side that she wonders if it might be coming from compensation. She has pain with running and states that she is frustrated    Patient is frustrated that he continues to give her pain and other things continue to give her pain.  Past Medical History:  Diagnosis Date  . 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 Fatigue   . Gastric polyps March 2012   Endoscopy  . GERD (gastroesophageal reflux disease) Feb 2011   esophagitis by EGD Feb 2011  . Hiatal hernia   . Palpitations    a. Holter 2009 with PVCs and bigeminy. b. Holter 1/12 with PVCs and short runs of atrial tachycardia (up to 9 beats). c. Holter 12/2014: rare PACs, rare short runs of narrow complex tachycardia c/w atrial tachycardia.  Marland Kitchen PAT (paroxysmal atrial tachycardia) ([place])   . Poor circulation of extremity    Bil legs  . Premature atrial contractions   . PVC's (premature ventricular contractions)   . S/P left heart catheterization by percutaneous approach 2009   a. cath 11/2007: minor luminal irregularities, EF 60%.  . Sinus bradycardia    a. HR occasionally 40s per patient, rare dips to 30s.  . Varicose veins    Past Surgical History:  Procedure Laterality Date  . ABDOMINAL HYSTERECTOMY    . CARDIAC  CATHETERIZATION  2009   Left heart cath  . CESAREAN SECTION    . CHOLECYSTECTOMY    . Right GSV ELAS with 10-20 stab phlebectomies right calf  05-25-11  . VESICOVAGINAL FISTULA CLOSURE W/ TAH     Social History   Socioeconomic History  . Marital status: Married    Spouse name: Not on file  . Number of children: Not on file  . Years of education: Not on file  . Highest education level: Not on file  Occupational History  . 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 Financial resource strain: Not on file  . Food insecurity:    Worry: Not on file    Inability: Not on file  . Transportation needs:    Medical: Not on file    Non-medical: Not on file  Tobacco Use  . Smoking status: Never Smoker  . Smokeless tobacco: Never Used  Substance and Sexual Activity  . Alcohol use: Yes    Alcohol/week: 1.5 oz    Types: 3 Standard drinks or equivalent per week    Comment: Rarely  . Drug use: No  . Sexual activity: Not on file  Lifestyle  . Physical activity:    Days per week: Not on file    Minutes per session: Not on file  . Stress: Not on file  Relationships  . Social connections:    Talks on phone: Not on file    Gets together: Not on file    Attends religious service: Not on file    Active member of club or organization: Not on file    Attends meetings of clubs or organizations: Not on file    Relationship status: Not on file  Other Topics Concern  . Not on file  Social History Narrative   Married   Allergies  Allergen Reactions  . Tape Hives and Other (See Comments)    redness   Family History  Problem Relation Age of Onset  . Breast cancer Sister   . Breast cancer Maternal Grandmother   . Breast cancer Paternal Grandmother   . Heart disease Paternal Grandfather   . Stroke Other        Grandfather  . Coronary artery disease Neg Hx        Premature     Past  medical history, social, surgical and family history all reviewed in electronic medical record.  No pertanent information unless stated regarding to the chief complaint.   Review of Systems:Review of systems updated and as accurate as of 07/15/17  No headache, visual changes, nausea, vomiting, diarrhea, constipation, dizziness, abdominal pain, skin rash, fevers, chills, night sweats, weight loss, swollen lymph nodes, body aches, joint swelling, muscle aches, chest pain, shortness of breath, mood changes.   Objective  Blood pressure 126/84, pulse (!) 58, height 5\' 4"  (1.626 m), weight 161 lb (73 kg), SpO2 98 %. Systems examined below as of 07/15/17   General: No apparent distress alert and oriented x3 mood and affect normal, dressed appropriately.  HEENT: Pupils equal, extraocular movements intact  Respiratory: Patient's speak in full sentences and does not appear short of breath  Cardiovascular: No lower extremity edema, non tender, no erythema  Skin: Warm dry intact with no signs of infection or rash on extremities or on axial skeleton.  Abdomen: Soft nontender  Neuro: Cranial nerves II through XII are intact, neurovascularly intact in all extremities with 2+ DTRs and 2+ pulses.  Lymph: No lymphadenopathy of posterior or anterior cervical chain or axillae bilaterally.  Gait normal with good balance and coordination.  MSK:  Non tender with full range of motion and good stability and symmetric strength and tone of elbows, wrist, hip, knee and ankles bilaterally.  Shoulder: left Inspection reveals no abnormalities, atrophy or asymmetry. Palpation is normal with no tenderness over AC joint or bicipital groove. ROM is full in all planes passively. Rotator cuff strength normal throughout. signs of impingement with positive Neer and Hawkin's tests, but negative empty can sign. Speeds and Yergason's tests normal. No labral pathology noted with negative Obrien's, negative clunk and good  stability. Normal scapular function observed. No painful arc and no drop arm sign. No apprehension sign Contralateral shoulder unremarkable  MSK US performed of: left This study was ordered, performed, and interpreted by Charlann Boxer D.O.  Shoulder:   Supraspinatus: Previous area.  Does have more of a scar tissue noted.  Bursal bulge seen with shoulder abduction on impingement view. Subscapularis:  Appears normal on long and transverse views. Positive bursa Teres Minor:  Appears normal on long and transverse views. AC joint:  Capsule undistended, no geyser sign. Glenohumeral Joint:  Appears normal without effusion. Glenoid Labrum:  Intact without visualized tears. Biceps Tendon:  Appears normal on long and transverse views, no fraying of tendon, tendon located in intertubercular groove, no subluxation with shoulder internal  or external rotation.  Impression: Subacromial bursitis with patient still having tear noted.  Procedure: Real-time Ultrasound Guided Injection of left glenohumeral joint Device: GE Logiq E  Ultrasound guided injection is preferred based studies that show increased duration, increased effect, greater accuracy, decreased procedural pain, increased response rate with ultrasound guided versus blind injection.  Verbal informed consent obtained.  Time-out conducted.  Noted no overlying erythema, induration, or other signs of local infection.  Skin prepped in a sterile fashion.  Local anesthesia: Topical Ethyl chloride.  With sterile technique and under real time ultrasound guidance:  Joint visualized.  23g 1  inch needle inserted posterior approach. Pictures taken for needle placement. Patient did have injection of 2 cc of 1% lidocaine, 2 cc of 0.5% Marcaine, and 1.0 cc of Kenalog 40 mg/dL. Completed without difficulty  Pain immediately resolved suggesting accurate placement of the medication.  Advised to call if fevers/chills, erythema, induration, drainage, or  persistent bleeding.  Images permanently stored and available for review in the ultrasound unit.  Impression: Technically successful ultrasound guided injection.    Impression and Recommendations:     This case required medical decision making of moderate complexity.      Note: This dictation was prepared with Dragon dictation along with smaller phrase technology. Any transcriptional errors that result from this process are unintentional.

## 2017-07-16 LAB — COMPREHENSIVE METABOLIC PANEL
ALBUMIN: 4.6 g/dL (ref 3.5–5.2)
ALK PHOS: 74 U/L (ref 39–117)
ALT: 19 U/L (ref 0–35)
AST: 20 U/L (ref 0–37)
BUN: 14 mg/dL (ref 6–23)
CALCIUM: 9.3 mg/dL (ref 8.4–10.5)
CO2: 26 mEq/L (ref 19–32)
Chloride: 103 mEq/L (ref 96–112)
Creatinine, Ser: 0.72 mg/dL (ref 0.40–1.20)
GFR: 89.77 mL/min (ref >60.00)
Glucose, Bld: 92 mg/dL (ref 70–99)
Potassium: 3.7 mEq/L (ref 3.5–5.1)
Sodium: 141 mEq/L (ref 135–145)
TOTAL PROTEIN: 7.5 g/dL (ref 6.0–8.3)
Total Bilirubin: 0.3 mg/dL (ref 0.2–1.2)

## 2017-07-16 LAB — IBC PANEL
Iron: 67 ug/dL (ref 42–145)
Saturation Ratios: 16.1 % — ABNORMAL LOW (ref 20.0–50.0)
Transferrin: 298 mg/dL (ref 212.0–360.0)

## 2017-07-16 LAB — C-REACTIVE PROTEIN: CRP: 0.4 mg/dL — AB (ref 0.5–20.0)

## 2017-07-16 LAB — URIC ACID: Uric Acid, Serum: 5.4 mg/dL (ref 2.4–7.0)

## 2017-07-17 LAB — ANGIOTENSIN CONVERTING ENZYME: Angiotensin-Converting Enzyme: 34 U/L (ref 9–67)

## 2017-07-17 LAB — CYCLIC CITRUL PEPTIDE ANTIBODY, IGG

## 2017-07-17 LAB — CALCIUM, IONIZED: CALCIUM ION: 4.8 mg/dL (ref 4.8–5.6)

## 2017-07-17 LAB — PTH, INTACT AND CALCIUM
Calcium: 9.6 mg/dL (ref 8.6–10.4)
PTH: 71 pg/mL — AB (ref 14–64)

## 2017-07-17 LAB — ANA: ANA: NEGATIVE

## 2017-07-17 LAB — RHEUMATOID FACTOR

## 2017-07-22 ENCOUNTER — Ambulatory Visit: Payer: BC Managed Care – PPO | Admitting: Family Medicine

## 2017-07-24 ENCOUNTER — Other Ambulatory Visit: Payer: Self-pay | Admitting: Cardiovascular Disease

## 2017-08-16 ENCOUNTER — Ambulatory Visit: Payer: BC Managed Care – PPO | Admitting: Family Medicine

## 2017-12-21 NOTE — Progress Notes (Signed)
[name] [place], [place] [address] Phone: [phone] Subjective:    [name] am serving as a Education administrator for Dr. Hulan Saas.  CC: Right shoulder pain  [id number]  Alexis Bradley is a 52 y.o. female coming in with complaint of right shoulder pain. Left is better. Keeping her up at night with radiating pain. Also has neck pain with burning sensation.  Onset- 6 months Location- AC joint   Character- Throbbing like a tooth ache  Aggravating factors- putting a jacket on, flexion, ADL  Reliving factors- heating pad and ice, ibuprofen Therapies tried-  Severity-6 out of 10  Has had left shoulder pain previously as well.  Has had a rotator cuff tear on the left side.  Still has some aching sensation there.  Mild osteoarthritic changes have been seen on x-ray of the glenohumeral joint with moderate of the acromioclavicular joint.  Independently visualized by me today.     Past Medical History:  Diagnosis Date  . 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 Fatigue   . Gastric polyps March 2012   Endoscopy  . GERD (gastroesophageal reflux disease) Feb 2011   esophagitis by EGD Feb 2011  . Hiatal hernia   . Palpitations    a. Holter 2009 with PVCs and bigeminy. b. Holter 1/12 with PVCs and short runs of atrial tachycardia (up to 9 beats). c. Holter 12/2014: rare PACs, rare short runs of narrow complex tachycardia c/w atrial tachycardia.  Marland Kitchen PAT (paroxysmal atrial tachycardia) ([place])   . Poor circulation of extremity    Bil legs  . Premature atrial contractions   . PVC's (premature ventricular contractions)   . S/P left heart catheterization by percutaneous approach 2009   a. cath 11/2007: minor luminal irregularities, EF 60%.  . Sinus bradycardia    a. HR occasionally 40s  per patient, rare dips to 30s.  . Varicose veins    Past Surgical History:  Procedure Laterality Date  . ABDOMINAL HYSTERECTOMY    . CARDIAC CATHETERIZATION  2009   Left heart cath  . CESAREAN SECTION    . CHOLECYSTECTOMY    . Right GSV ELAS with 10-20 stab phlebectomies right calf  05-25-11  . VESICOVAGINAL FISTULA CLOSURE W/ TAH     Social History   Socioeconomic History  . Marital status: Married    Spouse name: Not on file  . Number of children: Not on file  . Years of education: Not on file  . Highest education level: Not on file  Occupational History  . 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 Financial resource strain: Not on file  . Food insecurity:    Worry: Not on file    Inability: Not on file  . Transportation needs:    Medical: Not on file    Non-medical: Not on file  Tobacco Use  . Smoking status: Never Smoker  . Smokeless tobacco: Never Used  Substance and Sexual Activity  . Alcohol use: Yes    Alcohol/week: 3.0 standard drinks    Types: 3 Standard drinks or equivalent per week    Comment: Rarely  . Drug use: No  . Sexual activity: Not on file  Lifestyle  . Physical activity:  Days per week: Not on file    Minutes per session: Not on file  . Stress: Not on file  Relationships  . Social connections:    Talks on phone: Not on file    Gets together: Not on file    Attends religious service: Not on file    Active member of club or organization: Not on file    Attends meetings of clubs or organizations: Not on file    Relationship status: Not on file  Other Topics Concern  . Not on file  Social History Narrative   Married   Allergies  Allergen Reactions  . Tape Hives and Other (See Comments)    redness   Family History  Problem Relation Age of Onset  . Breast cancer Sister   . Breast cancer Maternal Grandmother   . Breast cancer Paternal  Grandmother   . Heart disease Paternal Grandfather   . Stroke Other        Grandfather  . Coronary artery disease Neg Hx        Premature     Current Outpatient Medications (Cardiovascular):  Marland Kitchen  CARTIA XT 180 MG 24 hr capsule, TAKE 1 CAPSULE(180 MG) BY MOUTH DAILY     Current Outpatient Medications (Other):  Marland Kitchen  Ascorbic Acid (VITAMIN C) 1000 MG tablet, Take 1,000 mg by mouth daily. .  Cholecalciferol (VITAMIN D3) 1000 units CAPS, Take 1 capsule by mouth daily. Marland Kitchen  MAGNESIUM PO, Take 1 tablet by mouth daily.   .  Multiple Vitamin (MULTIVITAMIN) tablet, Take 1 tablet by mouth daily.    Past medical history, social, surgical and family history all reviewed in electronic medical record.  No pertanent information unless stated regarding to the chief complaint.   Review of Systems:  No headache, visual changes, nausea, vomiting, diarrhea, constipation, dizziness, abdominal pain, skin rash, fevers, chills, night sweats, weight loss, swollen lymph nodes, body aches, joint swelling, muscle aches, chest pain, shortness of breath, mood changes.   Objective  Blood pressure (!) 142/90, pulse 67, height 5\' 4"  (1.626 m), weight 162 lb (73.5 kg), SpO2 98 %.    General: No apparent distress alert and oriented x3 mood and affect normal, dressed appropriately.  HEENT: Pupils equal, extraocular movements intact  Respiratory: Patient's speak in full sentences and does not appear short of breath  Cardiovascular: No lower extremity edema, non tender, no erythema  Skin: Warm dry intact with no signs of infection or rash on extremities or on axial skeleton.  Abdomen: Soft nontender  Neuro: Cranial nerves II through XII are intact, neurovascularly intact in all extremities with 2+ DTRs and 2+ pulses.  Lymph: No lymphadenopathy of posterior or anterior cervical chain or axillae bilaterally.  Gait normal with good balance and coordination.  MSK:  Non tender with full range of motion and good stability  and symmetric strength and tone of  elbows, wrist, hip, knee and ankles bilaterally.  Shoulder: Bilateral Inspection reveals no abnormalities, atrophy or asymmetry. Palpation is normal with no tenderness over AC joint or bicipital groove. ROM is full in all planes. Rotator cuff strength 4-5 but symmetric Positive impingement Speeds and Yergason's tests normal. No labral pathology noted with negative Obrien's, negative clunk and good stability. Normal scapular function observed. No painful arc and no drop arm sign. No apprehension sign  Procedure: Real-time Ultrasound Guided Injection of right glenohumeral joint Device: GE Logiq Q7  Ultrasound guided injection is preferred based studies that show increased duration, increased effect, greater accuracy,  decreased procedural pain, increased response rate with ultrasound guided versus blind injection.  Verbal informed consent obtained.  Time-out conducted.  Noted no overlying erythema, induration, or other signs of local infection.  Skin prepped in a sterile fashion.  Local anesthesia: Topical Ethyl chloride.  With sterile technique and under real time ultrasound guidance:  Joint visualized.  23g 1  inch needle inserted posterior approach. Pictures taken for needle placement. Patient did have injection of 2 cc of 1% lidocaine, 2 cc of 0.5% Marcaine, and 1.0 cc of Kenalog 40 mg/dL. Completed without difficulty  Pain immediately resolved suggesting accurate placement of the medication.  Advised to call if fevers/chills, erythema, induration, drainage, or persistent bleeding.  Images permanently stored and available for review in the ultrasound unit.  Impression: Technically successful ultrasound guided injection.  Noted left shoulder      Impression and Recommendations:     This case required medical decision making of moderate complexity. The above documentation has been reviewed and is accurate and complete Lyndal Pulley, DO         Note: This dictation was prepared with Dragon dictation along with smaller phrase technology. Any transcriptional errors that result from this process are unintentional.

## 2017-12-22 ENCOUNTER — Ambulatory Visit (INDEPENDENT_AMBULATORY_CARE_PROVIDER_SITE_OTHER): Payer: BC Managed Care – PPO | Admitting: Family Medicine

## 2017-12-22 ENCOUNTER — Ambulatory Visit: Payer: Self-pay

## 2017-12-22 ENCOUNTER — Encounter: Payer: Self-pay | Admitting: Family Medicine

## 2017-12-22 VITALS — BP 142/90 | HR 67 | Ht 64.0 in | Wt 162.0 lb

## 2017-12-22 DIAGNOSIS — M25512 Pain in left shoulder: Secondary | ICD-10-CM

## 2017-12-22 DIAGNOSIS — M7551 Bursitis of right shoulder: Secondary | ICD-10-CM

## 2017-12-22 DIAGNOSIS — M7552 Bursitis of left shoulder: Secondary | ICD-10-CM

## 2017-12-22 DIAGNOSIS — G8929 Other chronic pain: Secondary | ICD-10-CM

## 2017-12-22 DIAGNOSIS — M25511 Pain in right shoulder: Secondary | ICD-10-CM | POA: Diagnosis not present

## 2017-12-22 NOTE — Patient Instructions (Signed)
Good to see you  I would continue the vitamin D Stop all calcium  Injected both shoulders.  Ice is your friend.  Neck seems decent at the moment Keep hands within peripheral vision  See me again in 4 weeks

## 2017-12-22 NOTE — Assessment & Plan Note (Signed)
Bilateral injections given has had more chronic pain syndrome.  Patient does not want any other medications.  Discussed with patient about other possible labs that could be beneficial.  Reviewing patient's chart has been having some mild elevation in the PTH over the course of time as well as patient's calcium level.  We will monitor with the idea of repeating the labs in the next 3 to 6 months.  Patient will continue with conservative therapy otherwise.  Discussed icing regimen and home exercises.  Follow-up again with me in 4 to 8 weeks

## 2018-01-12 ENCOUNTER — Telehealth: Payer: Self-pay | Admitting: Cardiovascular Disease

## 2018-01-12 MED ORDER — PROPRANOLOL HCL 20 MG PO TABS: 20.0000 mg | ORAL_TABLET | Freq: Three times a day (TID) | ORAL | 0 refills | Status: DC | PRN

## 2018-01-12 NOTE — Telephone Encounter (Signed)
Patient c/o Palpitations:  High priority if patient c/o lightheadedness, shortness of breath, or chest pain  1) How long have you had palpitations/irregular HR/ Afib? Are you having the symptoms now? For the last couple hours, still happening  2) Are you currently experiencing lightheadedness, SOB or CP? A little dizzy, jittery  3) Do you have a history of afib (atrial fibrillation) or irregular heart rhythm? yes  4) Have you checked your BP or HR? (document readings if available): BP 150/100, HR 120, jumping to 150 then to 80  5) Are you experiencing any other symptoms? no

## 2018-01-12 NOTE — Telephone Encounter (Signed)
Patient calling stating she works at [employer] and they did an ekg on her and it came back abnormal  She is headed to [facility] urgent care

## 2018-01-12 NOTE — Telephone Encounter (Signed)
Returned the call to the patient. She stated that she has been having palpitations and a high heart rate. She also has complaints of the jitters and a headache. Her heart rate has been going from 80 to 150 for the past 2-3 hours. She stated that normally this subsides and does not last. The only difference in her routine was that she had the flu and shingles shot last Friday. She works at [employer] where they did an EKG. She stated that the EKG was abnormal due to the high heart rate of 150.  Per DOD, the patient may take propranolol 20 mg tid prn for tachycardia. She will monitor her blood pressure and not take the propranolol if her systolic blood pressure is below 140/150. She has an appointment tomorrow at 8:00 with Murray Hodgkins, NP. She will call the on call provider tonight or go to the ED if her heart rate and symptoms do not subside.

## 2018-01-13 ENCOUNTER — Ambulatory Visit: Payer: BC Managed Care – PPO | Admitting: Nurse Practitioner

## 2018-01-13 ENCOUNTER — Encounter: Payer: Self-pay | Admitting: Nurse Practitioner

## 2018-01-13 VITALS — BP 138/90 | HR 64 | Ht 64.0 in | Wt 156.8 lb

## 2018-01-13 DIAGNOSIS — I471 Supraventricular tachycardia: Secondary | ICD-10-CM

## 2018-01-13 DIAGNOSIS — I480 Paroxysmal atrial fibrillation: Secondary | ICD-10-CM

## 2018-01-13 MED ORDER — DILTIAZEM HCL ER COATED BEADS 240 MG PO CP24: 240.0000 mg | ORAL_CAPSULE | Freq: Every day | ORAL | 3 refills | Status: DC

## 2018-01-13 MED ORDER — PROPRANOLOL HCL 20 MG PO TABS: 10.0000 mg | ORAL_TABLET | Freq: Three times a day (TID) | ORAL | 0 refills | Status: DC | PRN

## 2018-01-13 NOTE — Progress Notes (Addendum)
Office Visit    Patient Name: Alexis Bradley Date of Encounter: 01/13/2018  Primary Care Provider:  [name], MD Primary Cardiologist:  Kathlyn Sacramento, MD  Chief Complaint    52 y.o. Female, with a history of aortic insufficiency, Paroxysmal atrial tachycardia, PAC's, PVC's and GERD, presents today with increase in palpitations and SOB for the past month.   Past Medical History    Past Medical History:  Diagnosis Date  . Aortic insufficiency    a. 05/2010 Echo: EF 65-70%; mild to moderate AI (trileaflet AoV), mild MR, Asc Ao dilated to 3.7 cm; b. 01/2015 Echo: EF 60-65%, no rwma, mild to mod AI, nl Ao root, mild MR, PASP 47mmHg.  . Fatigue   . Gastric polyps March 2012   Endoscopy  . GERD (gastroesophageal reflux disease) Feb 2011   esophagitis by EGD Feb 2011  . Hiatal hernia   . Paroxysmal atrial tachycardia ([place])    a. Holter 2009 with PVCs and bigeminy. b. Holter 1/12 with PVCs and short runs of atrial tachycardia (up to 9 beats). c. Holter 12/2014: rare PACs, rare short runs of narrow complex tachycardia c/w atrial tachycardia; d. 06/2016 Event monitor: runs of SVT.  Marland Kitchen PAT (paroxysmal atrial tachycardia) ([place])   . Poor circulation of extremity    Bil legs  . Premature atrial contractions   . PVC's (premature ventricular contractions)   . S/P left heart catheterization by percutaneous approach 2009   a. cath 11/2007: minor luminal irregularities, EF 60%.  . Sinus bradycardia    a. HR occasionally 40s per patient, rare dips to 30s.  . Varicose veins    Past Surgical History:  Procedure Laterality Date  . ABDOMINAL HYSTERECTOMY    . CARDIAC CATHETERIZATION  2009   Left heart cath  . CESAREAN SECTION    . CHOLECYSTECTOMY    . Right GSV ELAS with 10-20 stab phlebectomies right calf  05-25-11  . VESICOVAGINAL FISTULA CLOSURE W/ TAH      Allergies  Allergies  Allergen Reactions  . Tape Hives and Other (See Comments)    redness    History of Present  Illness    Alexis Bradley presents today after having an increase in palpitations for the past month. She has a history of aortic insufficiency, Paroxysmal atrial tachycardia, PAC's, PVC's and GERD. Was treated in the past for paroxysmal atrial tachycardia in 2009, for which she wore a Holter monitor that showed runs of PVC's and SVT. Was treated with metoprolol initially but was unable to tolerate due to bradycardia and fatigue. She was then switched to Diltiazem which was successful in controlling her rate. She also underwent a heart cath in 2009 which showed minor luminal irregularities and an EF of 60%. Last Echo in 2016 showed EF of 60-65%, no rwma, mild to mod AI, nl Ao root, mild MR, PASP 42mmHg.  She admits to increasing episodes of palpitations over the last 3-4 weeks, not associated with exertion,  with intermittent periods of DOE - esp when walking up a particular hill on her way into work in the morning. States that she normally jogs 3-4 times a week but has been unable to do so due to fatigue. Denies chest pain or swelling in lower extremities. She presents today after having multiple episodes of tachycardia while at work yesterday, along with weakness, dizziness, SOB and headache. She states her symptoms presented on and off throughout the morning yesterday, and decided to see the nurse at her  place of employment that afternoon. She states her BP at the time was 150/100 and an EKG was performed that was abnormal. The nurse at her work informed her to contact her physician. She called into out office and was prescribed Propanolol 20mg  TID PRN. She took one dose yesterday evening with resolution of tachycardia, she continued to experience fatigue and her BP at home was running 100/60's. In the office today she is in NSR in the 60's and did bring her EKG's from work yesterday that show a three minute telemetry strip of NSR converting Atrial Fibrillation with a fair amt of beat to beat variation but a  fastest rate approaching 180 bpm.  Home Medications    Prior to Admission medications   Medication Sig Start Date End Date Taking? Authorizing Provider  Ascorbic Acid (VITAMIN C) 1000 MG tablet Take 1,000 mg by mouth daily.    [provider]  CARTIA XT 180 MG 24 hr capsule TAKE 1 CAPSULE(180 MG) BY MOUTH DAILY 07/26/17   Wellington Hampshire, MD  Cholecalciferol (VITAMIN D3) 1000 units CAPS Take 1 capsule by mouth daily.    [provider]  MAGNESIUM PO Take 1 tablet by mouth daily.      [provider]  Multiple Vitamin (MULTIVITAMIN) tablet Take 1 tablet by mouth daily.    [provider]  propranolol (INDERAL) 20 MG tablet Take 1 tablet (20 mg total) by mouth 3 (three) times daily as needed (For tachycardia). 01/12/18   Minna Merritts, MD    Review of Systems    Reports intermittent palpitations and SOB over past 3-4 weeks, none today. Denies chest pain, fatigue or swelling.  No presyncope or syncope.  All other systems reviewed and are otherwise negative except as noted above.  Physical Exam    VS:  BP 138/90 (BP Location: Left Arm, Patient Position: Sitting, Cuff Size: Normal)   Pulse 64   Ht 5\' 4"  (1.626 m)   Wt 156 lb 12 oz (71.1 kg)   SpO2 98%   BMI 26.91 kg/m  , BMI Body mass index is 26.91 kg/m. GEN: Well nourished, well developed, in no acute distress. HEENT: normal. Neck: Supple, no JVD, carotid bruits, or masses. Cardiac: RRR, no murmurs, rubs, or gallops. No clubbing, cyanosis, edema.  Radials/DP/PT 2+ and equal bilaterally.  Respiratory:  Respirations regular and unlabored, clear to auscultation bilaterally. GI: Soft, nontender, nondistended, BS + x 4. MS: no deformity or atrophy. Skin: warm and dry, no rash. Neuro:  Strength and sensation are intact. Psych: Normal affect.  Accessory Clinical Findings    ECG personally reviewed by me today - NSR, possible Left Atrial enlargement - no acute changes.  Assessment & Plan    1.   Paroxysmal Atrial Fibrillation:  Presents with an ~ 1 month h/o intermittent, more frequent palpitations and also DOE.  Ss have been occurring throughout the day and night, even at rest.  Yesterday, she had more persistent and bothersome symptoms ssociated with intermittent SOB, dizziness and fatigue and was found to be in Afib by an occupational health nurse. Currently on 180mg  Diltiazem PO Daily and started on 20mg  Propanolol PO TID PRN yesterday.  She took a dose of propranolol last night and had resolution of palpitations but also felt fatigued afterwards.  We discussed the dx of Afib @ length today.  CHA2DS2-VASc score was 1, due to being female. She has no other cardiovascular history, non-smoker, and no prior strokes. Because of this anticoagulation is  not warranted at this time, but will continue to monitor necessity if risk factors change. We discussed option for rate vs rhythm mgmt including titration of Diltiazem w/PRN beta-blocker, addition of daily  blocker, or initiation of antiarrhythmic rx.  We mutually agreed on a more conservative approach of increasing dilt to 240mg  daily.  She will keep propranolol as a prn option but will take only 1/2 a tab as last night's dose caused fatigue and dropped her BP to 224 systolic.  Will also check BMET, CBC, Mag, and TSH today and will schedule Echocardiogram. She was agreeable to trying antiarrythmic's in the future, such as flecainide, if rate control unsuccessful at controlling symptoms.  She will notify us if symptoms are not responsive to higher dose of dilt or if having to take frequent prn  blocker.  OTW, f/u in ~ 6 wks.  We can discuss potential role of sleep study at that point.  2. Paroxysmal Atrial Tachycardia: Reported stable over last several months until recent episodes over last few weeks. Will continue to use Diltiazem for rate control and add Propanolol,  and monitor for increase in occurrence.   3. Disposition: Will obtain labs today and  echocardiogram, increase Diltiazem and add PRN Propanolol. Follow up in 4-6 wks.   Murray Hodgkins, NP 01/13/2018, 8:59 AM

## 2018-01-13 NOTE — Patient Instructions (Signed)
Medication Instructions:  Your physician has recommended you make the following change in your medication:  1- INCREASE Diltiazem to 240 mg by mouth once a day. 2- CHANGE Propranolol to 1/2 tablet (10 mg) by mouth three times a day AS NEEDED for palpitations.   Labwork: Your physician recommends that you return for lab work in: [place].   Testing/Procedures: Your physician has requested that you have an echocardiogram. Echocardiography is a painless test that uses sound waves to create images of your heart. It provides your doctor with information about the size and shape of your heart and how well your heart's chambers and valves are working. This procedure takes approximately one hour. There are no restrictions for this procedure. You may get an IV, if needed, to receive an ultrasound enhancing agent through to better visualize your heart.     Follow-Up: Your physician recommends that you schedule a follow-up appointment in: [place] [facility].  If you need a refill on your cardiac medications before your next appointment, please call your pharmacy.    Echocardiogram An echocardiogram, or echocardiography, uses sound waves (ultrasound) to produce an image of your heart. The echocardiogram is simple, painless, obtained within a short period of time, and offers valuable information to your health care provider. The images from an echocardiogram can provide information such as:  Evidence of coronary artery disease (CAD).  Heart size.  Heart muscle function.  Heart valve function.  Aneurysm detection.  Evidence of a past heart attack.  Fluid buildup around the heart.  Heart muscle thickening.  Assess heart valve function.  Tell a health care provider about:  Any allergies you have.  All medicines you are taking, including vitamins, herbs, eye drops, creams, and over-the-counter medicines.  Any problems you or family members have had with anesthetic  medicines.  Any blood disorders you have.  Any surgeries you have had.  Any medical conditions you have.  Whether you are pregnant or may be pregnant. What happens before the procedure? No special preparation is needed. Eat and drink normally. What happens during the procedure?  In order to produce an image of your heart, gel will be applied to your chest and a wand-like tool (transducer) will be moved over your chest. The gel will help transmit the sound waves from the transducer. The sound waves will harmlessly bounce off your heart to allow the heart images to be captured in real-time motion. These images will then be recorded.  You may need an IV to receive a medicine that improves the quality of the pictures. What happens after the procedure? You may return to your normal schedule including diet, activities, and medicines, unless your health care provider tells you otherwise. This information is not intended to replace advice given to you by your health care provider. Make sure you discuss any questions you have with your health care provider. Document Released: 04/10/2000 Document Revised: 11/30/2015 Document Reviewed: 12/19/2012 Elsevier Interactive Patient Education  2017 [employer].

## 2018-01-14 LAB — CBC WITH DIFFERENTIAL/PLATELET
BASOS: 0 %
Basophils Absolute: 0 10*3/uL (ref 0.0–0.2)
EOS (ABSOLUTE): 0.1 10*3/uL (ref 0.0–0.4)
Eos: 2 %
Hematocrit: 41.4 % (ref 34.0–46.6)
Hemoglobin: 14.2 g/dL (ref 11.1–15.9)
IMMATURE GRANS (ABS): 0 10*3/uL (ref 0.0–0.1)
Immature Granulocytes: 0 %
Lymphocytes Absolute: 2.1 10*3/uL (ref 0.7–3.1)
Lymphs: 36 %
MCH: 31.8 pg (ref 26.6–33.0)
MCHC: 34.3 g/dL (ref 31.5–35.7)
MCV: 93 fL (ref 79–97)
MONOS ABS: 0.4 10*3/uL (ref 0.1–0.9)
Monocytes: 6 %
NEUTROS ABS: 3.2 10*3/uL (ref 1.4–7.0)
Neutrophils: 56 %
Platelets: 272 10*3/uL (ref 150–450)
RBC: 4.46 x10E6/uL (ref 3.77–5.28)
RDW: 13.8 % (ref 12.3–15.4)
WBC: 5.7 10*3/uL (ref 3.4–10.8)

## 2018-01-14 LAB — BASIC METABOLIC PANEL
BUN/Creatinine Ratio: 18 (ref 9–23)
BUN: 11 mg/dL (ref 6–24)
CO2: 27 mmol/L (ref 20–29)
Calcium: 9.3 mg/dL (ref 8.7–10.2)
Chloride: 104 mmol/L (ref 96–106)
Creatinine, Ser: 0.62 mg/dL (ref 0.57–1.00)
GFR, EST AFRICAN AMERICAN: 118 mL/min/{1.73_m2} (ref >59)
GFR, EST NON AFRICAN AMERICAN: 103 mL/min/{1.73_m2} (ref >59)
Glucose: 91 mg/dL (ref 65–99)
Potassium: 4.2 mmol/L (ref 3.5–5.2)
SODIUM: 142 mmol/L (ref 134–144)

## 2018-01-14 LAB — TSH: TSH: 4.43 u[IU]/mL (ref 0.450–4.500)

## 2018-01-14 LAB — MAGNESIUM: Magnesium: 2.2 mg/dL (ref 1.6–2.3)

## 2018-01-18 ENCOUNTER — Ambulatory Visit (INDEPENDENT_AMBULATORY_CARE_PROVIDER_SITE_OTHER): Payer: BC Managed Care – PPO

## 2018-01-18 ENCOUNTER — Other Ambulatory Visit: Payer: Self-pay

## 2018-01-18 DIAGNOSIS — I471 Supraventricular tachycardia: Secondary | ICD-10-CM

## 2018-01-19 ENCOUNTER — Other Ambulatory Visit: Payer: Self-pay | Admitting: Cardiovascular Disease

## 2018-01-20 ENCOUNTER — Telehealth: Payer: Self-pay | Admitting: Cardiovascular Disease

## 2018-01-20 DIAGNOSIS — I471 Supraventricular tachycardia: Secondary | ICD-10-CM

## 2018-01-20 NOTE — Telephone Encounter (Signed)
Pt is calling states she feels like her heart is beating irregular Patient c/o Palpitations:  High priority if patient c/o lightheadedness, shortness of breath, or chest pain  1) How long have you had palpitations/irregular HR/ Afib? Are you having the symptoms now? Since 11:00 this morning. Yes, she is experiencing these symptoms now  2) Are you currently experiencing lightheadedness, SOB or CP? lightheadedness  3) Do you have a history of afib (atrial fibrillation) or irregular heart rhythm? yes  4) Have you checked your BP or HR? (document readings if available): no, pt is at work  5) Are you experiencing any other symptoms? no

## 2018-01-20 NOTE — Telephone Encounter (Signed)
I spoke with the patient.  She states that about 11:00 am she started feeling "weird" with dizziness and not being about to focus.  She was at work and did feel like her heart was racing, but she did not have her BP monitor with her today to check a BP/ HR.  She took diltiazem 240 mg about 6:30 am/ 7 am this morning. She has been on this increased dose since Friday 9/20. About 2 pm she took a 1/2 tablet of propranolol and this seemed to settle her HR down.  She is still currently at work so she does not have any current vital signs at this time.   Her BP on average has been running ~ 120-130/ 70 & HR- 60's. This morning she was 140/80 HR 78 prior to going to work. She states that is when she started to feel "weird."  I have advised her I will forward to Ignacia Bayley, NP to make him aware and we will call her back with any further recommendations. The final report on her echo from 01/18/18 is currently pending Gerald Stabs' review.

## 2018-01-21 NOTE — Telephone Encounter (Signed)
It's not clear that her symptoms are related to either recurrent afib or bp variations on the higher dose of dilt (140/80, hr 78 reported). Echo reported separately but overall stable since 2012. It reads as though she took a 1/2 of propranolol despite not having tachycardia?  Let's place a 14d zio monitor on her to determine afib burden, as she may be having some rate-controlled afib, at which point, if symptomatic, we'd be more likely to move toward an antiarrhythmic.

## 2018-01-24 NOTE — Telephone Encounter (Signed)
Patient verbalized understanding of recommendations and is agreeable to wear the ZIO. Transferred to scheduler.

## 2018-01-24 NOTE — Progress Notes (Signed)
[name] [address] [place], [place] [address] Phone: [phone] Subjective:    I Alexis Bradley am serving as a Education administrator for Dr. Hulan Saas.   CC: Bilateral shoulder pain  [id number]  Alexis Bradley is a 52 y.o. female coming in with complaint of bilateral shoulder pain. States that her shoulders are much better. 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  Patient was given injections.  States she is feeling 85% better.     Past Medical History:  Diagnosis Date  . Aortic insufficiency    a. 05/2010 Echo: EF 65-70%; mild to moderate AI (trileaflet AoV), mild MR, Asc Ao dilated to 3.7 cm; b. 01/2015 Echo: EF 60-65%, no rwma, mild to mod AI, nl Ao root, mild MR, PASP 65mmHg.  . Fatigue   . Gastric polyps March 2012   Endoscopy  . GERD (gastroesophageal reflux disease) Feb 2011   esophagitis by EGD Feb 2011  . Hiatal hernia   . Paroxysmal atrial tachycardia ([place])    a. Holter 2009 with PVCs and bigeminy. b. Holter 1/12 with PVCs and short runs of atrial tachycardia (up to 9 beats). c. Holter 12/2014: rare PACs, rare short runs of narrow complex tachycardia c/w atrial tachycardia; d. 06/2016 Event monitor: runs of SVT.  Marland Kitchen PAT (paroxysmal atrial tachycardia) ([place])   . Poor circulation of extremity    Bil legs  . Premature atrial contractions   . PVC's (premature ventricular contractions)   . S/P left heart catheterization by percutaneous approach 2009   a. cath 11/2007: minor luminal irregularities, EF 60%.  . Sinus bradycardia    a. HR occasionally 40s per patient, rare dips to 30s.  . Varicose veins    Past Surgical History:  Procedure Laterality Date  . ABDOMINAL HYSTERECTOMY    . CARDIAC CATHETERIZATION  2009   Left heart cath  . CESAREAN SECTION    . CHOLECYSTECTOMY    . Right GSV ELAS with 10-20 stab phlebectomies right calf  05-25-11  . VESICOVAGINAL FISTULA CLOSURE W/ TAH     Social History   Socioeconomic History    . Marital status: Married    Spouse name: Not on file  . Number of children: Not on file  . Years of education: Not on file  . Highest education level: Not on file  Occupational History  . Occupation: Math Professor at [employer]: Also works in teh administration  Social Needs  . Financial resource strain: Not on file  . Food insecurity:    Worry: Not on file    Inability: Not on file  . Transportation needs:    Medical: Not on file    Non-medical: Not on file  Tobacco Use  . Smoking status: Never Smoker  . Smokeless tobacco: Never Used  Substance and Sexual Activity  . Alcohol use: Yes    Alcohol/week: 3.0 standard drinks    Types: 3 Standard drinks or equivalent per week    Comment: Rarely  . Drug use: No  . Sexual activity: Not on file  Lifestyle  . Physical activity:    Days per week: Not on file    Minutes per session: Not on file  . Stress: Not on file  Relationships  . Social connections:    Talks on phone: Not on file    Gets together: Not on file    Attends religious service: Not on file  Active member of club or organization: Not on file    Attends meetings of clubs or organizations: Not on file    Relationship status: Not on file  Other Topics Concern  . Not on file  Social History Narrative   Married   Allergies  Allergen Reactions  . Tape Hives and Other (See Comments)    redness   Family History  Problem Relation Age of Onset  . Breast cancer Sister   . Breast cancer Maternal Grandmother   . Breast cancer Paternal Grandmother   . Heart disease Paternal Grandfather   . Stroke Other        Grandfather  . Coronary artery disease Neg Hx        Premature     Current Outpatient Medications (Cardiovascular):  .  diltiazem (CARDIZEM CD) 240 MG 24 hr capsule, Take 1 capsule (240 mg total) by mouth daily. .  propranolol (INDERAL) 20 MG tablet, Take 0.5 tablets (10 mg total) by mouth 3 (three) times  daily as needed (For tachycardia). .  propranolol (INDERAL) 20 MG tablet, Take 0.5 tablets (10 mg total) by mouth 3 (three) times daily as needed.     Current Outpatient Medications (Other):  Marland Kitchen  Ascorbic Acid (VITAMIN C) 1000 MG tablet, Take 1,000 mg by mouth daily. Marland Kitchen  MAGNESIUM PO, Take 1 tablet by mouth daily.   .  Multiple Vitamin (MULTIVITAMIN) tablet, Take 1 tablet by mouth daily.    Past medical history, social, surgical and family history all reviewed in electronic medical record.  No pertanent information unless stated regarding to the chief complaint.   Review of Systems:  No headache, visual changes, nausea, vomiting, diarrhea, constipation, dizziness, abdominal pain, skin rash, fevers, chills, night sweats, weight loss, swollen lymph nodes, body aches, joint swelling, muscle aches, chest pain, shortness of breath, mood changes.   Objective  Blood pressure (!) 138/40, pulse (!) 56, height 5\' 4"  (1.626 m), weight 156 lb (70.8 kg), SpO2 96 %.   General: No apparent distress alert and oriented x3 mood and affect normal, dressed appropriately.  HEENT: Pupils equal, extraocular movements intact  Respiratory: Patient's speak in full sentences and does not appear short of breath  Cardiovascular: No lower extremity edema, non tender, no erythema  Skin: Warm dry intact with no signs of infection or rash on extremities or on axial skeleton.  Abdomen: Soft nontender  Neuro: Cranial nerves II through XII are intact, neurovascularly intact in all extremities with 2+ DTRs and 2+ pulses.  Lymph: No lymphadenopathy of posterior or anterior cervical chain or axillae bilaterally.  Gait normal with good balance and coordination.  MSK:  Non tender with full range of motion and good stability and symmetric strength and tone of  elbows, wrist, hip, knee and ankles bilaterally.  Shoulder: Bilateral Inspection reveals no abnormalities, atrophy or asymmetry. Palpation is normal with no tenderness  over AC joint or bicipital groove. ROM is full in all planes. Rotator cuff strength normal throughout. Mild impingement Speeds and Yergason's tests normal. No labral pathology noted with negative Obrien's, negative clunk and good stability.  Positive crossover Normal scapular function observed. No painful arc and no drop arm sign. No apprehension sign      Impression and Recommendations:     This case required medical decision making of moderate complexity. The above documentation has been reviewed and is accurate and complete Lyndal Pulley, DO       Note: This dictation was prepared with Dragon dictation along  with smaller phrase technology. Any transcriptional errors that result from this process are unintentional.

## 2018-01-25 ENCOUNTER — Ambulatory Visit (INDEPENDENT_AMBULATORY_CARE_PROVIDER_SITE_OTHER): Payer: BC Managed Care – PPO | Admitting: Family Medicine

## 2018-01-25 ENCOUNTER — Encounter: Payer: Self-pay | Admitting: Family Medicine

## 2018-01-25 DIAGNOSIS — M7552 Bursitis of left shoulder: Secondary | ICD-10-CM

## 2018-01-25 DIAGNOSIS — M7551 Bursitis of right shoulder: Secondary | ICD-10-CM | POA: Diagnosis not present

## 2018-01-25 NOTE — Assessment & Plan Note (Signed)
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  Discussed with patient at great length about icing regimen and home exercise.  We discussed further work-up for patient's pain.  Patient wants to avoid this at the moment.  Discussed icing regimen and home exercise.  Follow-up with me again in 6 to 8 weeks

## 2018-01-25 NOTE — Patient Instructions (Signed)
Go to see you  See me again in 6-8 weeks Aspirin 81 mg daily

## 2018-02-11 ENCOUNTER — Ambulatory Visit (INDEPENDENT_AMBULATORY_CARE_PROVIDER_SITE_OTHER): Payer: BC Managed Care – PPO

## 2018-02-11 DIAGNOSIS — I471 Supraventricular tachycardia: Secondary | ICD-10-CM

## 2018-02-17 ENCOUNTER — Ambulatory Visit: Payer: BC Managed Care – PPO | Admitting: Physician Assistant

## 2018-02-19 DIAGNOSIS — I2699 Other pulmonary embolism without acute cor pulmonale: Secondary | ICD-10-CM | POA: Insufficient documentation

## 2018-03-05 NOTE — Progress Notes (Signed)
Cardiology Office Note Date:  03/07/2018  Patient ID:  Wm, Fruchter [birth year], MRN [id number] PCP:  [name], MD  Cardiologist:  Dr. Fletcher Anon, MD    Chief Complaint: Follow up  History of Present Illness: Alexis Bradley is a 52 y.o. female with history of PAF not on [facility], PAT, PACs, PVCs, aortic insufficiency, and GERD who presents for follow up of palpitations.   She was previously evaluated for PAT in 2009 at which time a Holter monitor showed runs of PVCs and SVT. She was treated with metoprolol initially, though she was unable to tolerate this due to bradycardia and fatigue. She was changed to diltiazem which was successful in controlling her heart rate. She underwent a cardiac cath in 2009 that showed minor luminal irregularities and an EF of 60%. Echo in 2016 showed an EF of 60-65%, no RWMA, mild to moderate AI, normal aortic root, mild MR, PASP 35 mmHg.   She was last seen in the office on 01/13/2018 noting increasing palpitations over the prior 3-4 weeks with intermittent episodes of DOE. She had been seen by the nurse at her job on 01/12/18 for palpitations with a BP of 150/100 with an EKG being performed that was abnormal with a reported heart rate of 150 bpm. She called into our office and was prescribed propranolol 20 mg tid. She did bring in her EKG from her job for review which showed a three minute telemetry strip of NSR converting to Afib with a fair amount of beat to beat variation with a fastest rate approaching 180 bpm, per note. At her visit with [place] on 01/13/18, she was in sinus rhythm. She noted fatigue and soft BP with the propranolol. She was not started on [facility] given a CHADS2VASc of 1 (female). Her diltiazem was increased to 240 mg daily with recommendation to take propranolol 1/2 tab for palpitations. Labs checked on 01/13/18 showed a normal TSH of 4.430, K+ 4.2, SCr 0.62, CBC unremarkable, magnesium 2.2. Echo on 01/18/2018 showed an EF of 60-65%, no RWMA, LV diastolic  function normal, mild to moderate AI, normal size aortic root, mild MR, mildly dilated LA at 36 mm, PASP 35 mmHg. Patient called in late 12/2017 noting feeling "weird" and dizzy. She noted some palpitations with improvement in her heart following prn propranolol. She was advised to wear a 14-day Zio monitor with preliminary results showing a predominant rhythm of sinus rhythm with an average heart rate of 67 bpm (minimum heart rate 46 bpm, maximum heart rate 176 bpm). 26 SVT runs occurred with the fastest interval lasting 5 beats with a rate of 176 bpm. The longest run was 12.8 seconds with an average rate of 117 bpm. Isolated PACs with rare atrial couplets and triplets. Isolated PVCs with rare ventricular couplets. No evidence of Afib was noted. Patient triggered events corresponded to sinus rhythm and sinus rhythm with PACs.   Patient was seen by podiatry on 02/10/2018 with a 4-day history of left ankle pain following an injury in [place], [place].  While in [place] she was seen in the ER and told she had a fracture.  She was given crutches and told to follow-up in [place].  Upon seeing podiatry she was placed in a boot for management of her fracture.  Following this, patient was seen on 10/25 with increased shortness of breath and cough.  She was seen at [facility] ED on 10/25 and diagnosed with a subsegmental PE which was felt to be  in the setting of her left ankle fracture.  Troponin and BNP were negative.  Echo showed normal LV and RV systolic function, mild to moderate aortic insufficiency, mild pulmonary hypertension with a PASP of 45 mmHg.  Lower extremity ultrasound was negative for residual clot burden.  She was treated with DVT/PE dosed Eliquis.  She plans to follow-up with her PCP for management of her PE/Eliquis.  It was recommended the patient remain on anticoagulation for 6 months.  Hypercoagulable work-up was deferred given this was a provoked PE.  She comes in today feeling about the  same.  She denies any significant increase and palpitation burden following diagnosis of PE though she is not certain of this.  She does indicate she has not had any further palpitations similar to how she felt in 12/2017 when she was and documented A. fib.  She continues to take diltiazem 240 mg daily.  She has not needed any as needed propranolol.  No falls, BRBPR, or melena since she was last seen.  She continues to have a boot along the left lower extremity.   Past Medical History:  Diagnosis Date  . Aortic insufficiency    a. 05/2010 Echo: EF 65-70%; mild to moderate AI (trileaflet AoV), mild MR, Asc Ao dilated to 3.7 cm; b. 01/2015 Echo: EF 60-65%, no rwma, mild to mod AI, nl Ao root, mild MR, PASP 62mmHg.  . Fatigue   . Gastric polyps March 2012   Endoscopy  . GERD (gastroesophageal reflux disease) Feb 2011   esophagitis by EGD Feb 2011  . Hiatal hernia   . Paroxysmal atrial tachycardia ([place])    a. Holter 2009 with PVCs and bigeminy. b. Holter 1/12 with PVCs and short runs of atrial tachycardia (up to 9 beats). c. Holter 12/2014: rare PACs, rare short runs of narrow complex tachycardia c/w atrial tachycardia; d. 06/2016 Event monitor: runs of SVT.  Marland Kitchen PAT (paroxysmal atrial tachycardia) ([place])   . Poor circulation of extremity    Bil legs  . Premature atrial contractions   . PVC's (premature ventricular contractions)   . S/P left heart catheterization by percutaneous approach 2009   a. cath 11/2007: minor luminal irregularities, EF 60%.  . Sinus bradycardia    a. HR occasionally 40s per patient, rare dips to 30s.  . Varicose veins     Past Surgical History:  Procedure Laterality Date  . ABDOMINAL HYSTERECTOMY    . CARDIAC CATHETERIZATION  2009   Left heart cath  . CESAREAN SECTION    . CHOLECYSTECTOMY    . Right GSV ELAS with 10-20 stab phlebectomies right calf  05-25-11  . VESICOVAGINAL FISTULA CLOSURE W/ TAH      Current Meds  Medication Sig  . apixaban (ELIQUIS) 5 MG TABS  tablet Take by mouth.  . Ascorbic Acid (VITAMIN C) 1000 MG tablet Take 1,000 mg by mouth daily.  Marland Kitchen diltiazem (CARDIZEM CD) 300 MG 24 hr capsule Take 1 capsule (300 mg total) by mouth daily.  Marland Kitchen MAGNESIUM PO Take 1 tablet by mouth daily.    . Multiple Vitamin (MULTIVITAMIN) tablet Take 1 tablet by mouth daily.  . propranolol (INDERAL) 20 MG tablet Take 0.5 tablets (10 mg total) by mouth 3 (three) times daily as needed (For tachycardia).  . propranolol (INDERAL) 20 MG tablet Take 0.5 tablets (10 mg total) by mouth 3 (three) times daily as needed.  . [DISCONTINUED] diltiazem (CARDIZEM CD) 240 MG 24 hr capsule Take 1 capsule (240 mg total) by mouth  daily.    Allergies:   Tape   Social History:  The patient  reports that she has never smoked. She has never used smokeless tobacco. She reports that she drinks about 3.0 standard drinks of alcohol per week. She reports that she does not use drugs.   Family History:  The patient's family history includes Breast cancer in her maternal grandmother, paternal grandmother, and sister; Heart disease in her paternal grandfather; Stroke in her other.  ROS:   ROS   PHYSICAL EXAM:  VS:  BP 130/74 (BP Location: Left Arm, Patient Position: Sitting, Cuff Size: Normal)   Pulse 69   Ht 5\' 4"  (1.626 m)   Wt 155 lb (70.3 kg)   BMI 26.61 kg/m  BMI: Body mass index is 26.61 kg/m.  Physical Exam  Constitutional: She is oriented to person, place, and time. She appears well-developed and well-nourished.  HENT:  Head: Normocephalic and atraumatic.  Eyes: Right eye exhibits no discharge. Left eye exhibits no discharge.  Neck: Normal range of motion. No JVD present.  Cardiovascular: Normal rate, regular rhythm, S1 normal and S2 normal. Exam reveals no distant heart sounds, no friction rub, no midsystolic click and no opening snap.  Murmur heard.  Harsh midsystolic murmur is present with a grade of 1/6 at the upper right sternal border radiating to the  neck. High-pitched blowing holosystolic murmur of grade 1/6 is also present at the apex. Pulses:      Posterior tibial pulses are 2+ on the right side.  Pulmonary/Chest: Effort normal and breath sounds normal. No respiratory distress. She has no decreased breath sounds. She has no wheezes. She has no rales. She exhibits no tenderness.  Abdominal: Soft. She exhibits no distension. There is no tenderness.  Musculoskeletal: She exhibits no edema.  Bruit noted along left lower extremity  Neurological: She is alert and oriented to person, place, and time.  Skin: Skin is warm and dry. No cyanosis. Nails show no clubbing.  Psychiatric: She has a normal mood and affect. Her speech is normal and behavior is normal. Judgment and thought content normal.     EKG:  Was ordered and interpreted by me today. Shows NSR, 69 bpm, LVH, no acute ST-T changes  Recent Labs: 07/15/2017: ALT 19 01/13/2018: BUN 11; Creatinine, Ser 0.62; Hemoglobin 14.2; Magnesium 2.2; Platelets 272; Potassium 4.2; Sodium 142; TSH 4.430  No results found for requested labs within last 8760 hours.   CrCl cannot be calculated (Patient's most recent lab result is older than the maximum 21 days allowed.).   Wt Readings from Last 3 Encounters:  03/07/18 155 lb (70.3 kg)  01/25/18 156 lb (70.8 kg)  01/13/18 156 lb 12 oz (71.1 kg)     Other studies reviewed: Additional studies/records reviewed today include: summarized above  ASSESSMENT AND PLAN:  1. PAF: No documented A. fib on recent 14-day ZIO.  No symptoms similar to how she felt when she had documented A. fib.  Although she is currently on Eliquis for PE she will not require long-term full dose oral anticoagulation for her A. fib given a CHADS2VASC of 1.  Increase Cardizem CD to 300 mg daily.  Continue PRN propanolol for tachypalpitations.  Recent labs including thyroid function, potassium, magnesium were unrevealing.  2. Paroxysmal SVT/PACs/PVCs: As above.  3. Pulmonary  embolism: In the setting of immobilized left lower extremity secondary to ankle fracture.  Remains on PE/DVT dosed Eliquis.  To be followed by patient's PCP.  4. Ankle fracture: Per orthopedic/podiatry.  Disposition: F/u with Dr. Fletcher Anon or an APP in 9 months.    Current medicines are reviewed at length with the patient today.  The patient did not have any concerns regarding medicines.  Signed, Christell Faith, PA-C 03/07/2018 3:13 PM     [address] [place], [place] [address] [phone]

## 2018-03-07 ENCOUNTER — Ambulatory Visit: Payer: BC Managed Care – PPO | Admitting: Physician Assistant

## 2018-03-07 ENCOUNTER — Encounter: Payer: Self-pay | Admitting: Physician Assistant

## 2018-03-07 VITALS — BP 130/74 | HR 69 | Ht 64.0 in | Wt 155.0 lb

## 2018-03-07 DIAGNOSIS — I471 Supraventricular tachycardia: Secondary | ICD-10-CM

## 2018-03-07 DIAGNOSIS — R002 Palpitations: Secondary | ICD-10-CM

## 2018-03-07 DIAGNOSIS — I480 Paroxysmal atrial fibrillation: Secondary | ICD-10-CM | POA: Diagnosis not present

## 2018-03-07 DIAGNOSIS — I2699 Other pulmonary embolism without acute cor pulmonale: Secondary | ICD-10-CM | POA: Diagnosis not present

## 2018-03-07 DIAGNOSIS — S82892S Other fracture of left lower leg, sequela: Secondary | ICD-10-CM

## 2018-03-07 MED ORDER — DILTIAZEM HCL ER COATED BEADS 300 MG PO CP24: 300.0000 mg | ORAL_CAPSULE | Freq: Every day | ORAL | 2 refills | Status: DC

## 2018-03-07 NOTE — Patient Instructions (Addendum)
Medication Instructions:  INCREASE the Cardizem to 300 mg daily  If you need a refill on your cardiac medications before your next appointment, please call your pharmacy.   Lab work: None ordered  Testing/Procedures: Your physician has requested that you have an echocardiogram in 9 months Echocardiography is a painless test that uses sound waves to create images of your heart. It provides your doctor with information about the size and shape of your heart and how well your heart's chambers and valves are working. You may receive an ultrasound enhancing agent through an IV if needed to better visualize your heart during the echo.This procedure takes approximately one hour. There are no restrictions for this procedure. This will take place at the [facility] clinic.    Follow-Up: At [facility], you and your health needs are our priority.  As part of our continuing mission to provide you with exceptional heart care, we have created designated Provider Care Teams.  These Care Teams include your primary Cardiologist (physician) and Advanced Practice Providers (APPs -  Physician Assistants and Nurse Practitioners) who all work together to provide you with the care you need, when you need it. You will need a follow up appointment in 9 months after the echo. Please call our office 2 months in advance to schedule this appointment.  You may see Kathlyn Sacramento, MD or one of the following Advanced Practice Providers on your designated Care Team:   Murray Hodgkins, NP Christell Faith, PA-C . Marrianne Mood, PA-C

## 2018-03-09 ENCOUNTER — Ambulatory Visit: Payer: BC Managed Care – PPO | Admitting: Family Medicine

## 2018-03-30 ENCOUNTER — Other Ambulatory Visit: Payer: Self-pay | Admitting: Podiatry

## 2018-03-30 DIAGNOSIS — M25572 Pain in left ankle and joints of left foot: Secondary | ICD-10-CM

## 2018-04-15 ENCOUNTER — Ambulatory Visit
Admission: RE | Admit: 2018-04-15 | Discharge: 2018-04-15 | Disposition: A | Discharge status: Home / Self Care | Payer: BC Managed Care – PPO | Source: Ambulatory Visit | Attending: Podiatry | Admitting: Podiatry

## 2018-04-15 DIAGNOSIS — M25572 Pain in left ankle and joints of left foot: Secondary | ICD-10-CM

## 2018-04-15 DIAGNOSIS — R609 Edema, unspecified: Secondary | ICD-10-CM | POA: Insufficient documentation

## 2018-04-15 DIAGNOSIS — S93492A Sprain of other ligament of left ankle, initial encounter: Secondary | ICD-10-CM | POA: Diagnosis not present

## 2018-04-15 DIAGNOSIS — X501XXA Overexertion from prolonged static or awkward postures, initial encounter: Secondary | ICD-10-CM | POA: Diagnosis not present

## 2018-04-15 DIAGNOSIS — M2540 Effusion, unspecified joint: Secondary | ICD-10-CM | POA: Diagnosis not present

## 2018-04-15 DIAGNOSIS — S93402A Sprain of unspecified ligament of left ankle, initial encounter: Secondary | ICD-10-CM | POA: Insufficient documentation

## 2018-08-16 ENCOUNTER — Encounter: Payer: Self-pay | Admitting: Family Medicine

## 2018-08-16 ENCOUNTER — Other Ambulatory Visit: Payer: Self-pay

## 2018-08-16 ENCOUNTER — Ambulatory Visit: Payer: BC Managed Care – PPO | Admitting: Family Medicine

## 2018-08-16 VITALS — BP 112/76 | HR 63 | Ht 64.0 in | Wt 162.0 lb

## 2018-08-16 DIAGNOSIS — M542 Cervicalgia: Secondary | ICD-10-CM

## 2018-08-16 MED ORDER — KETOROLAC TROMETHAMINE 60 MG/2ML IM SOLN
60.0000 mg | Freq: Once | INTRAMUSCULAR | Status: AC
  Administered 2018-08-16: 60 mg via INTRAMUSCULAR

## 2018-08-16 MED ORDER — GABAPENTIN 100 MG PO CAPS: 200.0000 mg | ORAL_CAPSULE | Freq: Every day | ORAL | 3 refills | Status: DC

## 2018-08-16 MED ORDER — TIZANIDINE HCL 4 MG PO TABS: 4.0000 mg | ORAL_TABLET | Freq: Three times a day (TID) | ORAL | 2 refills | Status: AC | PRN

## 2018-08-16 MED ORDER — METHYLPREDNISOLONE ACETATE 80 MG/ML IJ SUSP
80.0000 mg | Freq: Once | INTRAMUSCULAR | Status: AC
  Administered 2018-08-16: 80 mg via INTRAMUSCULAR

## 2018-08-16 MED ORDER — TRAMADOL HCL 50 MG PO TABS: 50.0000 mg | ORAL_TABLET | Freq: Four times a day (QID) | ORAL | 0 refills | Status: AC | PRN

## 2018-08-16 NOTE — Assessment & Plan Note (Signed)
I believe the patient is having more neck pain with bilateral shoulder pain.  I think it is more secondary to cervical radiculopathy.  Patient has had subacromial bursitis bilaterally.  We discussed about the possibility of injecting the shoulders but felt fairly strong when it was coming from the neck.  Muscle relaxer given, discussed home exercises as well.  Discussed which activities to do which wants to avoid.  Patient will be following up virtually in 1 week.  Worsening pain patient can come back and we can try the shoulder injections if needed.  Patient is agreement with the plan.  Avoid prednisone secondary to coronavirus outbreak.

## 2018-08-16 NOTE — Patient Instructions (Signed)
ood to see you  Ice 20 minutes 2 times daily. Usually after activity and before bed. 2 injections today  Zanaflex up to 3 times a day- muscle relaxer Gaapentin 200mg  at night to help the nerve Tramadol and take with 1 tylenol up to 2 times a day for severe pain  See me virtually in 6-7 days  Call me if worsening

## 2018-08-16 NOTE — Progress Notes (Signed)
[name] [address] [place], [place] [address] Phone: [phone] Subjective:      CC: Neck pain and bilateral shoulder pain  IWP:[id number]   01/25/2018: Patient has bilateral shoulder bursitis but also has a chromic clavicular arthritis.  Discussed with patient at great length about icing regimen and home exercise.  We discussed further work-up for patient's pain.  Patient wants to avoid this at the moment.  Discussed icing regimen and home exercise.  Follow-up with me again in 6 to 8 weeks  Update 08/16/2018: Alexis Bradley is a 53 y.o. female coming in with complaint of bilateral shoulder pain with the right greater than the left. Patient states that last night she woke up with pain in the right side of her neck. Was on the computer  Has used IBU today for pain. Is using eliquis for history of PE this past year. Also fractured ankle since last visit. Did have relief from steroid injection last year.       Past Medical History:  Diagnosis Date  . Aortic insufficiency    a. 05/2010 Echo: EF 65-70%; mild to moderate AI (trileaflet AoV), mild MR, Asc Ao dilated to 3.7 cm; b. 01/2015 Echo: EF 60-65%, no rwma, mild to mod AI, nl Ao root, mild MR, PASP 56mmHg.  . Fatigue   . Gastric polyps March 2012   Endoscopy  . GERD (gastroesophageal reflux disease) Feb 2011   esophagitis by EGD Feb 2011  . Hiatal hernia   . Paroxysmal atrial tachycardia ([place])    a. Holter 2009 with PVCs and bigeminy. b. Holter 1/12 with PVCs and short runs of atrial tachycardia (up to 9 beats). c. Holter 12/2014: rare PACs, rare short runs of narrow complex tachycardia c/w atrial tachycardia; d. 06/2016 Event monitor: runs of SVT.  Marland Kitchen PAT (paroxysmal atrial tachycardia) ([place])   . Poor circulation of extremity    Bil legs  . Premature atrial contractions   . PVC's (premature ventricular contractions)   . S/P left heart catheterization by percutaneous approach 2009   a. cath 11/2007:  minor luminal irregularities, EF 60%.  . Sinus bradycardia    a. HR occasionally 40s per patient, rare dips to 30s.  . Varicose veins    Past Surgical History:  Procedure Laterality Date  . ABDOMINAL HYSTERECTOMY    . CARDIAC CATHETERIZATION  2009   Left heart cath  . CESAREAN SECTION    . CHOLECYSTECTOMY    . Right GSV ELAS with 10-20 stab phlebectomies right calf  05-25-11  . VESICOVAGINAL FISTULA CLOSURE W/ TAH     Social History   Socioeconomic History  . Marital status: Married    Spouse name: Not on file  . Number of children: Not on file  . Years of education: Not on file  . Highest education level: Not on file  Occupational History  . Occupation: Math Professor at [employer]: Also works in teh administration  Social Needs  . Financial resource strain: Not on file  . Food insecurity:    Worry: Not on file    Inability: Not on file  . Transportation needs:    Medical: Not on file    Non-medical: Not on file  Tobacco Use  . Smoking status: Never Smoker  . Smokeless tobacco: Never Used  Substance and Sexual Activity  . Alcohol use: Yes    Alcohol/week: 3.0 standard drinks  Types: 3 Standard drinks or equivalent per week    Comment: Rarely  . Drug use: No  . Sexual activity: Not on file  Lifestyle  . Physical activity:    Days per week: Not on file    Minutes per session: Not on file  . Stress: Not on file  Relationships  . Social connections:    Talks on phone: Not on file    Gets together: Not on file    Attends religious service: Not on file    Active member of club or organization: Not on file    Attends meetings of clubs or organizations: Not on file    Relationship status: Not on file  Other Topics Concern  . Not on file  Social History Narrative   Married   Allergies  Allergen Reactions  . Tape Hives and Other (See Comments)    redness   Family History  Problem Relation Age of Onset  .  Breast cancer Sister   . Breast cancer Maternal Grandmother   . Breast cancer Paternal Grandmother   . Heart disease Paternal Grandfather   . Stroke Other        Grandfather  . Coronary artery disease Neg Hx        Premature     Current Outpatient Medications (Cardiovascular):  .  propranolol (INDERAL) 20 MG tablet, Take 0.5 tablets (10 mg total) by mouth 3 (three) times daily as needed (For tachycardia). .  propranolol (INDERAL) 20 MG tablet, Take 0.5 tablets (10 mg total) by mouth 3 (three) times daily as needed. .  diltiazem (CARDIZEM CD) 300 MG 24 hr capsule, Take 1 capsule (300 mg total) by mouth daily.   Current Outpatient Medications (Analgesics):  .  traMADol (ULTRAM) 50 MG tablet, Take 1 tablet (50 mg total) by mouth every 6 (six) hours as needed for up to 5 days.  Current Outpatient Medications (Hematological):  .  apixaban (ELIQUIS) 5 MG TABS tablet, Take by mouth.  Current Outpatient Medications (Other):  Marland Kitchen  Ascorbic Acid (VITAMIN C) 1000 MG tablet, Take 1,000 mg by mouth daily. Marland Kitchen  MAGNESIUM PO, Take 1 tablet by mouth daily.   .  Multiple Vitamin (MULTIVITAMIN) tablet, Take 1 tablet by mouth daily. Marland Kitchen  gabapentin (NEURONTIN) 100 MG capsule, Take 2 capsules (200 mg total) by mouth at bedtime. Marland Kitchen  tiZANidine (ZANAFLEX) 4 MG tablet, Take 1 tablet (4 mg total) by mouth every 8 (eight) hours as needed for up to 10 days for muscle spasms.    Past medical history, social, surgical and family history all reviewed in electronic medical record.  No pertanent information unless stated regarding to the chief complaint.   Review of Systems:  No headache, visual changes, nausea, vomiting, diarrhea, constipation, dizziness, abdominal pain, skin rash, fevers, chills, night sweats, weight loss, swollen lymph nodes, body aches, joint swelling, muscle aches, chest pain, shortness of breath, mood changes.   Objective  Blood pressure 112/76, pulse 63, height 5\' 4"  (1.626 m), weight 162 lb  (73.5 kg), SpO2 98 %.   General: No apparent distress alert and oriented x3 mood and affect normal, dressed appropriately.  HEENT: Pupils equal, extraocular movements intact  Respiratory: Patient's speak in full sentences and does not appear short of breath  Cardiovascular: No lower extremity edema, non tender, no erythema  Skin: Warm dry intact with no signs of infection or rash on extremities or on axial skeleton.  Abdomen: Soft nontender  Neuro: Cranial nerves II through XII are  intact, neurovascularly intact in all extremities with 2+ DTRs and 2+ pulses.  Lymph: No lymphadenopathy of posterior or anterior cervical chain or axillae bilaterally.  Gait normal with good balance and coordination.  MSK:  Non tender with full range of motion and good stability and symmetric strength and tone of  elbows, wrist, hip, knee and ankles bilaterally.  Patient bilateral shoulder has some mild positive impingement but increased range of motion the patient is even baseline.  4+ out of 5 strength of rotator cuff bilaterally.  Neck exam severe tightness.  With even 5 degrees of extension is severe radicular symptoms going down the right arm.  Patient's grip strength was intact.  Discontinued the rest of physical exam secondary to patient's pain.    Impression and Recommendations:     This case required medical decision making of moderate complexity. The above documentation has been reviewed and is accurate and complete Lyndal Pulley, DO       Note: This dictation was prepared with Dragon dictation along with smaller phrase technology. Any transcriptional errors that result from this process are unintentional.

## 2018-08-22 NOTE — Progress Notes (Signed)
[name] [address] [place], [place] [address] Phone: [phone] Subjective:    Virtual Visit via Video Note  I connected with Alexis Bradley on 08/22/18 at  8:30 AM EDT by a video enabled telemedicine application and verified that I am speaking with the correct person using two identifiers.   I discussed the limitations of evaluation and management by telemedicine and the availability of in person appointments. The patient expressed understanding and agreed to proceed.  Patient was in her home residence while I was in the office setting on the visual platform.  We were the only individuals on the call.    I discussed the assessment and treatment plan with the patient. The patient was provided an opportunity to ask questions and all were answered. The patient agreed with the plan and demonstrated an understanding of the instructions.   The patient was advised to call back or seek an in-person evaluation if the symptoms worsen or if the condition fails to improve as anticipated.  I provided 16 minutes of face-to-face time during this encounter.   Lyndal Pulley, DO    CC: Neck pain follow-up  [id number]  Alexis Bradley is a 53 y.o. female coming in with complaint of cervical neck pain.  Patient was seen and was having more radicular symptoms.  Patient states that since I seen her in the office and given the medication she is feeling 90 to 95% better.  Very minimal discomfort at the moment.      Past Medical History:  Diagnosis Date  . Aortic insufficiency    a. 05/2010 Echo: EF 65-70%; mild to moderate AI (trileaflet AoV), mild MR, Asc Ao dilated to 3.7 cm; b. 01/2015 Echo: EF 60-65%, no rwma, mild to mod AI, nl Ao root, mild MR, PASP 35mmHg.  . Fatigue   . Gastric polyps March 2012   Endoscopy  . GERD (gastroesophageal reflux disease) Feb 2011   esophagitis by EGD Feb 2011  . Hiatal hernia   . Paroxysmal atrial tachycardia ([place])    a. Holter 2009 with PVCs and bigeminy. b. Holter 1/12 with PVCs and short runs of atrial tachycardia (up to 9 beats). c. Holter 12/2014: rare PACs, rare short runs of narrow complex tachycardia c/w atrial tachycardia; d. 06/2016 Event monitor: runs of SVT.  Marland Kitchen PAT (paroxysmal atrial tachycardia) ([place])   . Poor circulation of extremity    Bil legs  . Premature atrial contractions   . PVC's (premature ventricular contractions)   . S/P left heart catheterization by percutaneous approach 2009   a. cath 11/2007: minor luminal irregularities, EF 60%.  . Sinus bradycardia    a. HR occasionally 40s per patient, rare dips to 30s.  . Varicose veins    Past Surgical History:  Procedure Laterality Date  . ABDOMINAL HYSTERECTOMY    . CARDIAC CATHETERIZATION  2009   Left heart cath  . CESAREAN SECTION    . CHOLECYSTECTOMY    . Right GSV ELAS with 10-20 stab phlebectomies right calf  05-25-11  . VESICOVAGINAL FISTULA CLOSURE W/ TAH     Social History   Socioeconomic History  . Marital status: Married    Spouse name: Not on file  . Number of children: Not on file  . Years of education: Not on file  . Highest education level: Not on file  Occupational History  . 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 Financial resource strain: Not on file  . Food insecurity:    Worry: Not on file    Inability: Not on file  . Transportation needs:    Medical: Not on file    Non-medical: Not on file  Tobacco Use  . Smoking status: Never Smoker  . Smokeless tobacco: Never Used  Substance and Sexual Activity  . Alcohol use: Yes    Alcohol/week: 3.0 standard drinks    Types: 3 Standard drinks or equivalent per week    Comment: Rarely  . Drug use: No  . Sexual activity: Not on file  Lifestyle  . Physical activity:    Days per week: Not on file    Minutes per session: Not on file  . Stress: Not on file   Relationships  . Social connections:    Talks on phone: Not on file    Gets together: Not on file    Attends religious service: Not on file    Active member of club or organization: Not on file    Attends meetings of clubs or organizations: Not on file    Relationship status: Not on file  Other Topics Concern  . Not on file  Social History Narrative   Married   Allergies  Allergen Reactions  . Tape Hives and Other (See Comments)    redness   Family History  Problem Relation Age of Onset  . Breast cancer Sister   . Breast cancer Maternal Grandmother   . Breast cancer Paternal Grandmother   . Heart disease Paternal Grandfather   . Stroke Other        Grandfather  . Coronary artery disease Neg Hx        Premature     Current Outpatient Medications (Cardiovascular):  .  diltiazem (CARDIZEM CD) 300 MG 24 hr capsule, Take 1 capsule (300 mg total) by mouth daily. .  propranolol (INDERAL) 20 MG tablet, Take 0.5 tablets (10 mg total) by mouth 3 (three) times daily as needed (For tachycardia). .  propranolol (INDERAL) 20 MG tablet, Take 0.5 tablets (10 mg total) by mouth 3 (three) times daily as needed.    Current Outpatient Medications (Hematological):  .  apixaban (ELIQUIS) 5 MG TABS tablet, Take by mouth.  Current Outpatient Medications (Other):  Marland Kitchen  Ascorbic Acid (VITAMIN C) 1000 MG tablet, Take 1,000 mg by mouth daily. Marland Kitchen  gabapentin (NEURONTIN) 100 MG capsule, Take 2 capsules (200 mg total) by mouth at bedtime. Marland Kitchen  MAGNESIUM PO, Take 1 tablet by mouth daily.   .  Multiple Vitamin (MULTIVITAMIN) tablet, Take 1 tablet by mouth daily. Marland Kitchen  tiZANidine (ZANAFLEX) 4 MG tablet, Take 1 tablet (4 mg total) by mouth every 8 (eight) hours as needed for up to 10 days for muscle spasms.    Past medical history, social, surgical and family history all reviewed in electronic medical record.  No pertanent information unless stated regarding to the chief complaint.   Review of Systems:   No headache, visual changes, nausea, vomiting, diarrhea, constipation, dizziness, abdominal pain, skin rash, fevers, chills, night sweats, weight loss, swollen lymph nodes, body aches, joint swelling, , chest pain, shortness of breath, mood changes.  Positive muscle aches  Objective     General: No apparent distress alert and oriented x3 mood and affect normal, dressed appropriately.  Patient looks much more comfortable than when I saw her in the office.  Patient does have improved range  of motion of the neck that I can see virtually.    Impression and Recommendations:     This case required medical decision making of moderate complexity. The above documentation has been reviewed and is accurate and complete Lyndal Pulley, DO       Note: This dictation was prepared with Dragon dictation along with smaller phrase technology. Any transcriptional errors that result from this process are unintentional.

## 2018-08-23 ENCOUNTER — Ambulatory Visit (INDEPENDENT_AMBULATORY_CARE_PROVIDER_SITE_OTHER): Payer: BC Managed Care – PPO | Admitting: Family Medicine

## 2018-08-23 ENCOUNTER — Encounter: Payer: Self-pay | Admitting: Family Medicine

## 2018-08-23 DIAGNOSIS — M542 Cervicalgia: Secondary | ICD-10-CM | POA: Diagnosis not present

## 2018-08-23 NOTE — Assessment & Plan Note (Signed)
Patient is doing significantly better at this point.  We discussed icing regimen and home exercise.  Discussed which activities of doing which wants to avoid.  Patient is to increase activity slowly.  As long as patient is well can follow-up as needed.

## 2018-09-06 ENCOUNTER — Telehealth: Payer: Self-pay | Admitting: Cardiovascular Disease

## 2018-09-06 NOTE — Telephone Encounter (Signed)

## 2018-09-22 ENCOUNTER — Other Ambulatory Visit: Payer: Self-pay

## 2018-09-22 ENCOUNTER — Encounter: Payer: Self-pay | Admitting: Cardiovascular Disease

## 2018-09-22 ENCOUNTER — Telehealth (INDEPENDENT_AMBULATORY_CARE_PROVIDER_SITE_OTHER): Payer: BC Managed Care – PPO | Admitting: Cardiovascular Disease

## 2018-09-22 VITALS — BP 127/73 | HR 63 | Ht 64.0 in | Wt 159.0 lb

## 2018-09-22 DIAGNOSIS — I480 Paroxysmal atrial fibrillation: Secondary | ICD-10-CM

## 2018-09-22 DIAGNOSIS — Z86711 Personal history of pulmonary embolism: Secondary | ICD-10-CM | POA: Diagnosis not present

## 2018-09-22 DIAGNOSIS — I471 Supraventricular tachycardia: Secondary | ICD-10-CM

## 2018-09-22 DIAGNOSIS — K449 Diaphragmatic hernia without obstruction or gangrene: Secondary | ICD-10-CM | POA: Insufficient documentation

## 2018-09-22 NOTE — Progress Notes (Signed)
Virtual Visit via Video Note   This visit type was conducted due to national recommendations for restrictions regarding the COVID-19 Pandemic (e.g. social distancing) in an effort to limit this patient's exposure and mitigate transmission in our community.  Due to her co-morbid illnesses, this patient is at least at moderate risk for complications without adequate follow up.  This format is felt to be most appropriate for this patient at this time.  All issues noted in this document were discussed and addressed.  A limited physical exam was performed with this format.  Please refer to the patient's chart for her consent to telehealth for [facility].   Date:  09/22/2018   ID:  Alexis Bradley, DOB 11-29-1965, MRN [id number]  Patient Location: Home Provider Location: Office  PCP:  [name], MD  Cardiologist:  Kathlyn Sacramento, MD  Electrophysiologist:  None   Evaluation Performed:  Follow-Up Visit  Chief Complaint: Palpitations  History of Present Illness:    Alexis Bradley is a 53 y.o. female was seen via video visit for follow-up regarding palpitations due to paroxysmal atrial fibrillation with low burden, paroxysmal SVT and PVCs. She had cardiac catheterization 2009 which showed no significant coronary artery disease. EF is normal. She was treated in the past with metoprolol but she developed fatigue and bradycardia. She was switched to diltiazem with subsequent improvement. She had worsening palpitations in September with a documented brief atrial fibrillation lasting few minutes.  Her labs were unremarkable.  Echocardiogram showed normal LV systolic function, mild to moderate aortic insufficiency, mild mitral regurgitation, and borderline pulmonary hypertension.  14 days ZIO monitor showed sinus rhythm with recurrent episodes of SVT the longest lasted 13 seconds.  There was no evidence of atrial fibrillation. She fell in October and developed an ankle fracture.  1 week after  that, she developed shortness of breath and cough and was diagnosed with subsegmental pulmonary embolism.  She was treated with anticoagulation for 6 months. She has been doing reasonably well.  She continues to have intermittent palpitations but again does not last long and is not associated with dizziness or syncope.  No chest pain or shortness of breath.  The patient does not have symptoms concerning for COVID-19 infection (fever, chills, cough, or new shortness of breath).    Past Medical History:  Diagnosis Date  . Aortic insufficiency    a. 05/2010 Echo: EF 65-70%; mild to moderate AI (trileaflet AoV), mild MR, Asc Ao dilated to 3.7 cm; b. 01/2015 Echo: EF 60-65%, no rwma, mild to mod AI, nl Ao root, mild MR, PASP 80mmHg.  . Fatigue   . Gastric polyps March 2012   Endoscopy  . GERD (gastroesophageal reflux disease) Feb 2011   esophagitis by EGD Feb 2011  . Hiatal hernia   . Paroxysmal atrial tachycardia ([place])    a. Holter 2009 with PVCs and bigeminy. b. Holter 1/12 with PVCs and short runs of atrial tachycardia (up to 9 beats). c. Holter 12/2014: rare PACs, rare short runs of narrow complex tachycardia c/w atrial tachycardia; d. 06/2016 Event monitor: runs of SVT.  Marland Kitchen PAT (paroxysmal atrial tachycardia) ([place])   . Poor circulation of extremity    Bil legs  . Premature atrial contractions   . PVC's (premature ventricular contractions)   . S/P left heart catheterization by percutaneous approach 2009   a. cath 11/2007: minor luminal irregularities, EF 60%.  . Sinus bradycardia    a. HR occasionally 40s per patient, rare dips to  30s.  . Varicose veins    Past Surgical History:  Procedure Laterality Date  . ABDOMINAL HYSTERECTOMY    . CARDIAC CATHETERIZATION  2009   Left heart cath  . CESAREAN SECTION    . CHOLECYSTECTOMY    . Right GSV ELAS with 10-20 stab phlebectomies right calf  05-25-11  . VESICOVAGINAL FISTULA CLOSURE W/ TAH       Current Meds  Medication Sig  . Ascorbic  Acid (VITAMIN C) 1000 MG tablet Take 1,000 mg by mouth daily.  Marland Kitchen aspirin (ASPIRIN 81) 81 MG chewable tablet Chew 81 mg by mouth daily.  Marland Kitchen diltiazem (CARDIZEM CD) 300 MG 24 hr capsule Take 1 capsule (300 mg total) by mouth daily.  . Magnesium 250 MG TABS Take 25 mg by mouth daily.  Marland Kitchen MAGNESIUM PO Take 1 tablet by mouth daily.    . Multiple Vitamin (MULTIVITAMIN) tablet Take 1 tablet by mouth daily.  Marland Kitchen omeprazole (PRILOSEC) 20 MG capsule Take 20 mg by mouth daily.   . propranolol (INDERAL) 20 MG tablet Take 0.5 tablets (10 mg total) by mouth 3 (three) times daily as needed.     Allergies:   Tape   Social History   Tobacco Use  . Smoking status: Never Smoker  . Smokeless tobacco: Never Used  Substance Use Topics  . Alcohol use: Yes    Alcohol/week: 3.0 standard drinks    Types: 3 Standard drinks or equivalent per week    Comment: Rarely  . Drug use: No     Family Hx: The patient's family history includes Breast cancer in her maternal grandmother, paternal grandmother, and sister; Heart disease in her paternal grandfather; Stroke in an other family member. There is no history of Coronary artery disease.  ROS:   Please see the history of present illness.     All other systems reviewed and are negative.   Prior CV studies:   The following studies were reviewed today:  Reviewed most recent echocardiogram and monitor  Labs/Other Tests and Data Reviewed:    EKG:  No ECG reviewed.  Recent Labs: 01/13/2018: BUN 11; Creatinine, Ser 0.62; Hemoglobin 14.2; Magnesium 2.2; Platelets 272; Potassium 4.2; Sodium 142; TSH 4.430   Recent Lipid Panel Lab Results  Component Value Date/Time   CHOL 231 (H) 10/08/2015 03:19 PM   TRIG 112.0 10/08/2015 03:19 PM   HDL 106.90 10/08/2015 03:19 PM   CHOLHDL 2 10/08/2015 03:19 PM   LDLCALC 101 (H) 10/08/2015 03:19 PM    Wt Readings from Last 3 Encounters:  09/22/18 159 lb (72.1 kg)  08/16/18 162 lb (73.5 kg)  03/07/18 155 lb (70.3 kg)      Objective:    Vital Signs:  BP 127/73   Pulse 63   Ht 5\' 4"  (1.626 m)   Wt 159 lb (72.1 kg)   BMI 27.29 kg/m    VITAL SIGNS:  reviewed GEN:  no acute distress EYES:  sclerae anicteric, EOMI - Extraocular Movements Intact RESPIRATORY:  normal respiratory effort, symmetric expansion CARDIOVASCULAR:  no peripheral edema SKIN:  no rash, lesions or ulcers. MUSCULOSKELETAL:  no obvious deformities. NEURO:  alert and oriented x 3, no obvious focal deficit PSYCH:  normal affect  ASSESSMENT & PLAN:    1. Paroxysmal atrial fibrillation SVT: The A. fib burden is overall low and thus anticoagulation is not recommended especially with a chads Vas score of 1.  She continues to have intermittent palpitations but these are brief.  Continue same dose of diltiazem.  Given relatively low resting heart rate, further increase of diltiazem might worsen her bradycardia.  If episodes become more bothersome, treatment with an antiarrhythmic medication such as class Ic drugs can be considered. 2. Pulmonary embolism: In the setting of ankle fracture.  She finished 6 months of anticoagulation.  I agree with switching to aspirin.  COVID-19 Education: The signs and symptoms of COVID-19 were discussed with the patient and how to seek care for testing (follow up with PCP or arrange E-visit).  The importance of social distancing was discussed today.  Time:   Today, I have spent 14 minutes with the patient with telehealth technology discussing the above problems.     Medication Adjustments/Labs and Tests Ordered: Current medicines are reviewed at length with the patient today.  Concerns regarding medicines are outlined above.   Tests Ordered: No orders of the defined types were placed in this encounter.   Medication Changes: No orders of the defined types were placed in this encounter.   Disposition:  Follow up in 6 month(s)  Signed, Kathlyn Sacramento, MD  09/22/2018 9:30 AM    [place]

## 2018-09-22 NOTE — Patient Instructions (Signed)
Medication Instructions:  Continue same medications If you need a refill on your cardiac medications before your next appointment, please call your pharmacy.   Lab work: None If you have labs (blood work) drawn today and your tests are completely normal, you will receive your results only by: . MyChart Message (if you have MyChart) OR . A paper copy in the mail If you have any lab test that is abnormal or we need to change your treatment, we will call you to review the results.  Testing/Procedures: None  Follow-Up: At [facility], you and your health needs are our priority.  As part of our continuing mission to provide you with exceptional heart care, we have created designated Provider Care Teams.  These Care Teams include your primary Cardiologist (physician) and Advanced Practice Providers (APPs -  Physician Assistants and Nurse Practitioners) who all work together to provide you with the care you need, when you need it. You will need a follow up appointment in 6 months.  Please call our office 2 months in advance to schedule this appointment.  You may see Muhammad Arida, MD or one of the following Advanced Practice Providers on your designated Care Team:   Christopher Berge, NP Ryan Dunn, PA-C . Jacquelyn Visser, PA-C   

## 2018-11-08 ENCOUNTER — Other Ambulatory Visit: Payer: Self-pay | Admitting: Cardiovascular Disease

## 2018-11-08 NOTE — Telephone Encounter (Signed)
Pt mentioned that she is only taking Propranolol 20 mg tablet 0.5 TID PRN.  Please advise correct instructions and correct signature.

## 2018-11-21 ENCOUNTER — Other Ambulatory Visit: Payer: Self-pay | Admitting: Physician Assistant

## 2018-12-14 ENCOUNTER — Ambulatory Visit (INDEPENDENT_AMBULATORY_CARE_PROVIDER_SITE_OTHER): Payer: BC Managed Care – PPO

## 2018-12-14 ENCOUNTER — Other Ambulatory Visit: Payer: Self-pay

## 2018-12-14 DIAGNOSIS — I480 Paroxysmal atrial fibrillation: Secondary | ICD-10-CM | POA: Diagnosis not present

## 2019-03-21 ENCOUNTER — Encounter: Payer: Self-pay | Admitting: Cardiovascular Disease

## 2019-03-21 ENCOUNTER — Other Ambulatory Visit: Payer: Self-pay

## 2019-03-21 ENCOUNTER — Ambulatory Visit (INDEPENDENT_AMBULATORY_CARE_PROVIDER_SITE_OTHER): Payer: BC Managed Care – PPO | Admitting: Cardiovascular Disease

## 2019-03-21 VITALS — BP 128/74 | HR 56 | Ht 64.0 in | Wt 168.2 lb

## 2019-03-21 DIAGNOSIS — I471 Supraventricular tachycardia: Secondary | ICD-10-CM | POA: Diagnosis not present

## 2019-03-21 DIAGNOSIS — I493 Ventricular premature depolarization: Secondary | ICD-10-CM

## 2019-03-21 DIAGNOSIS — I359 Nonrheumatic aortic valve disorder, unspecified: Secondary | ICD-10-CM

## 2019-03-21 NOTE — Patient Instructions (Signed)
Medication Instructions:  Your physician recommends that you continue on your current medications as directed. Please refer to the Current Medication list given to you today.  *If you need a refill on your cardiac medications before your next appointment, please call your pharmacy*  Lab Work: None ordered If you have labs (blood work) drawn today and your tests are completely normal, you will receive your results only by: Marland Kitchen MyChart Message (if you have MyChart) OR . A paper copy in the mail If you have any lab test that is abnormal or we need to change your treatment, we will call you to review the results.  Testing/Procedures: None ordered  Follow-Up: At [facility], you and your health needs are our priority.  As part of our continuing mission to provide you with exceptional heart care, we have created designated Provider Care Teams.  These Care Teams include your primary Cardiologist (physician) and Advanced Practice Providers (APPs -  Physician Assistants and Nurse Practitioners) who all work together to provide you with the care you need, when you need it.  Your next appointment:   12 month(s)  The format for your next appointment:   In Person  Provider:    You may see Kathlyn Sacramento, MD or one of the following Advanced Practice Providers on your designated Care Team:    Murray Hodgkins, NP  Christell Faith, PA-C  Marrianne Mood, PA-C   Other Instructions N/A

## 2019-03-21 NOTE — Progress Notes (Signed)
Cardiology Office Note   Date:  03/21/2019   ID:  Alexis Bradley, DOB 1965-09-25, MRN [id number]  PCP:  [name], MD  Cardiologist:   Kathlyn Sacramento, MD   Chief Complaint  Patient presents with  . Other    6 month follow up. patient denies chest pain and SOB at this time. Meds reviewed verbally with patient.       History of Present Illness: Alexis Bradley is a 53 y.o. female who presents for a follow-up visit  regarding palpitations due to paroxysmal SVT and PVCs.  She also had documented episodes of brief atrial fibrillation. She had cardiac catheterization 2009 which showed no significant coronary artery disease. EF is normal. She was treated in the past with metoprolol but she developed fatigue and bradycardia. She was switched to diltiazem with subsequent improvement. She had worsening palpitations in September 2019 with a documented brief atrial fibrillation lasting few minutes.  Her labs were unremarkable.  Echocardiogram showed normal LV systolic function, mild to moderate aortic insufficiency, mild mitral regurgitation, and borderline pulmonary hypertension.  14 days ZIO monitor showed sinus rhythm with recurrent episodes of SVT the longest lasted 13 seconds.  There was no evidence of atrial fibrillation. She had pulmonary embolism last year in the setting of DVT related to ankle fracture.  She was treated with 6 months of anticoagulation.  She has been doing well overall and reports stable episodes of palpitations.  Most of her palpitations happen at night and occasionally she has to take propranolol.  She takes diltiazem daily.  No chest pain or shortness of breath.    Past Medical History:  Diagnosis Date  . Aortic insufficiency    a. 05/2010 Echo: EF 65-70%; mild to moderate AI (trileaflet AoV), mild MR, Asc Ao dilated to 3.7 cm; b. 01/2015 Echo: EF 60-65%, no rwma, mild to mod AI, nl Ao root, mild MR, PASP 16mmHg.  . Fatigue   . Gastric polyps March 2012   Endoscopy  . GERD (gastroesophageal reflux disease) Feb 2011   esophagitis by EGD Feb 2011  . Hiatal hernia   . Paroxysmal atrial tachycardia ([place])    a. Holter 2009 with PVCs and bigeminy. b. Holter 1/12 with PVCs and short runs of atrial tachycardia (up to 9 beats). c. Holter 12/2014: rare PACs, rare short runs of narrow complex tachycardia c/w atrial tachycardia; d. 06/2016 Event monitor: runs of SVT.  Marland Kitchen PAT (paroxysmal atrial tachycardia) ([place])   . Poor circulation of extremity    Bil legs  . Premature atrial contractions   . PVC's (premature ventricular contractions)   . S/P left heart catheterization by percutaneous approach 2009   a. cath 11/2007: minor luminal irregularities, EF 60%.  . Sinus bradycardia    a. HR occasionally 40s per patient, rare dips to 30s.  . Varicose veins     Past Surgical History:  Procedure Laterality Date  . ABDOMINAL HYSTERECTOMY    . CARDIAC CATHETERIZATION  2009   Left heart cath  . CESAREAN SECTION    . CHOLECYSTECTOMY    . Right GSV ELAS with 10-20 stab phlebectomies right calf  05-25-11  . VESICOVAGINAL FISTULA CLOSURE W/ TAH       Current Outpatient Medications  Medication Sig Dispense Refill  . Ascorbic Acid (VITAMIN C) 1000 MG tablet Take 1,000 mg by mouth daily.    Marland Kitchen aspirin (ASPIRIN 81) 81 MG chewable tablet Chew 81 mg by mouth daily.    Marland Kitchen CARTIA  XT 300 MG 24 hr capsule TAKE 1 CAPSULE(300 MG) BY MOUTH DAILY 90 capsule 3  . Magnesium 250 MG TABS Take 25 mg by mouth daily.    Marland Kitchen MAGNESIUM PO Take 1 tablet by mouth daily.      . Multiple Vitamin (MULTIVITAMIN) tablet Take 1 tablet by mouth daily.    Marland Kitchen omeprazole (PRILOSEC) 20 MG capsule Take 20 mg by mouth daily.     . propranolol (INDERAL) 20 MG tablet Take 0.5 tablet (10 mg) by mouth three times a day as needed for palpitations 30 tablet 3   No current facility-administered medications for this visit.     Allergies:   Tape    Social History:  The patient  reports that she has  never smoked. She has never used smokeless tobacco. She reports current alcohol use of about 3.0 standard drinks of alcohol per week. She reports that she does not use drugs.   Family History:  The patient's family history includes Breast cancer in her maternal grandmother, paternal grandmother, and sister; Heart disease in her paternal grandfather; Stroke in an other family member.    ROS:  Please see the history of present illness.   Otherwise, review of systems are positive for none.   All other systems are reviewed and negative.    PHYSICAL EXAM: VS:  BP 128/74 (BP Location: Left Arm, Patient Position: Sitting, Cuff Size: Normal)   Pulse (!) 56   Ht 5\' 4"  (1.626 m)   Wt 168 lb 4 oz (76.3 kg)   BMI 28.88 kg/m  , BMI Body mass index is 28.88 kg/m. GEN: Well nourished, well developed, in no acute distress  HEENT: normal  Neck: no JVD, carotid bruits, or masses Cardiac: RRR; no rubs, or gallops,no edema . 2/6 systolic ejection murmur in the aortic area. Respiratory:  clear to auscultation bilaterally, normal work of breathing GI: soft, nontender, nondistended, + BS MS: no deformity or atrophy  Skin: warm and dry, no rash Neuro:  Strength and sensation are intact Psych: euthymic mood, full affect   EKG:  EKG is ordered today. The ekg ordered today demonstrates sinus bradycardia with nonspecific ST changes.   Recent Labs: No results found for requested labs within last 8760 hours.    Lipid Panel    Component Value Date/Time   CHOL 231 (H) 10/08/2015 1519   TRIG 112.0 10/08/2015 1519   HDL 106.90 10/08/2015 1519   CHOLHDL 2 10/08/2015 1519   VLDL 22.4 10/08/2015 1519   LDLCALC 101 (H) 10/08/2015 1519      Wt Readings from Last 3 Encounters:  03/21/19 168 lb 4 oz (76.3 kg)  09/22/18 159 lb (72.1 kg)  08/16/18 162 lb (73.5 kg)       No flowsheet data found.    ASSESSMENT AND PLAN:   1. Paroxysmal SVT: She also had documented atrial fibrillation with overall  low burden.  She is doing well overall on diltiazem.  In addition, she takes propranolol as needed for breakthrough episodes.  2.  Aortic valve disease: Most recent echocardiogram in August showed normal LV systolic function with mild to moderate aortic regurgitation.  She does have a systolic murmur in the aortic area.  In addition, the ascending aorta was borderline dilated at 36 mmHg.  Recommend repeat echocardiogram in 2 to 3 years.   Disposition:   FU with me in 12 months  Signed,  Kathlyn Sacramento, MD  03/21/2019 3:47 PM    [place]

## 2019-04-06 IMAGING — DX DG SHOULDER 2+V*L*
3 series · 3 of 3 positions shown · non-contrast
Comparison: May 21, 2015

CLINICAL DATA: Chronic pain

EXAM:
LEFT SHOULDER - 2+ VIEW

[grashey]
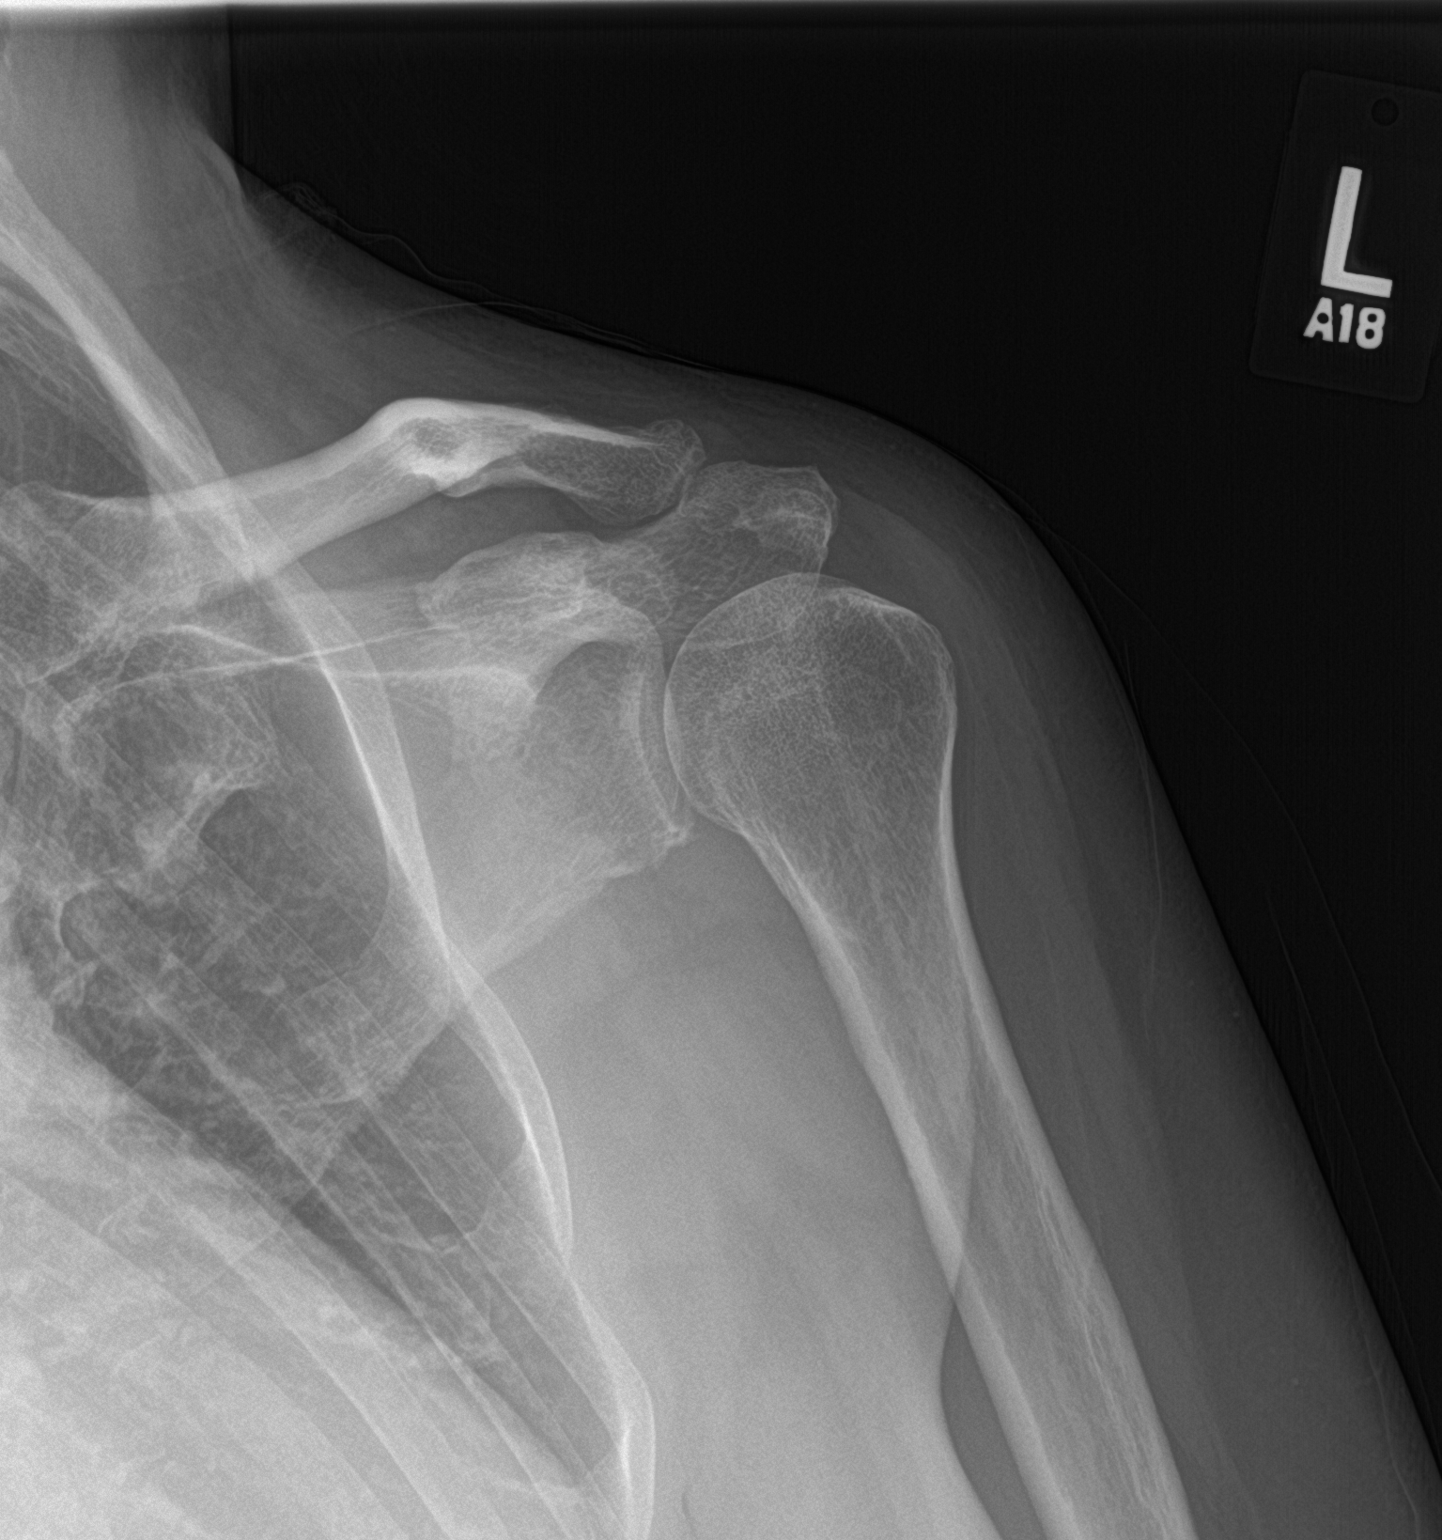

[y view]
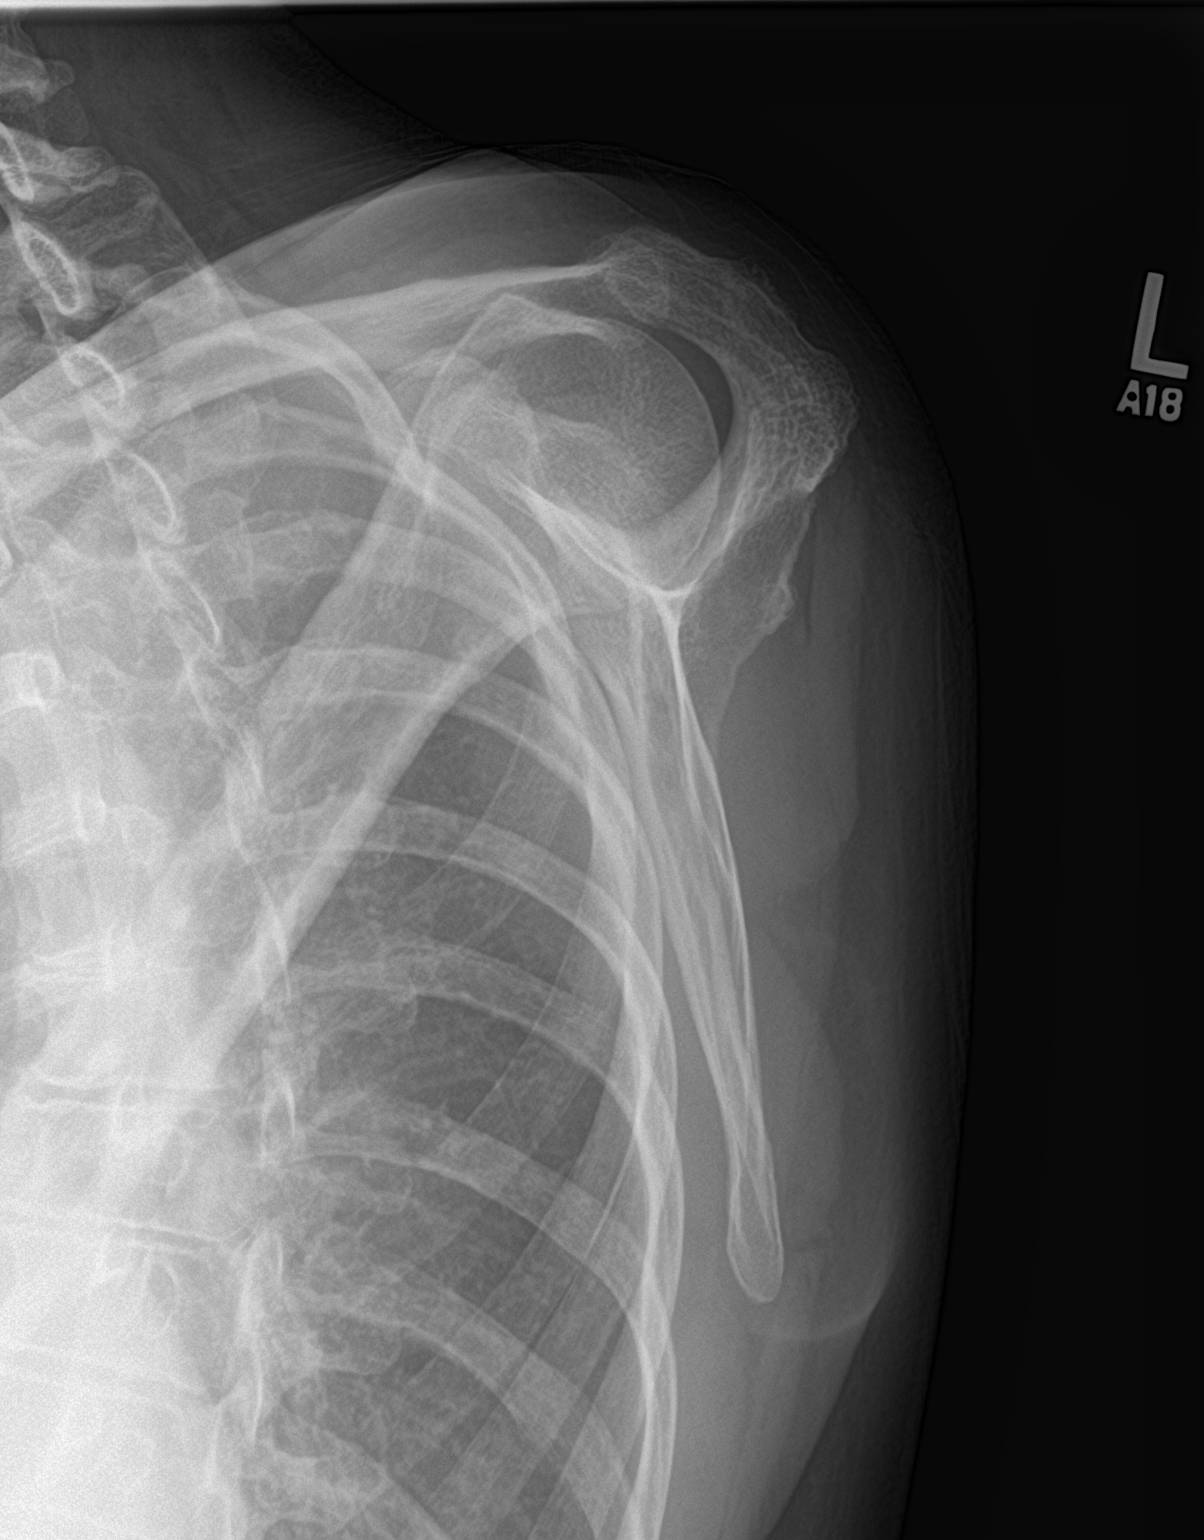

[shoulder axial]
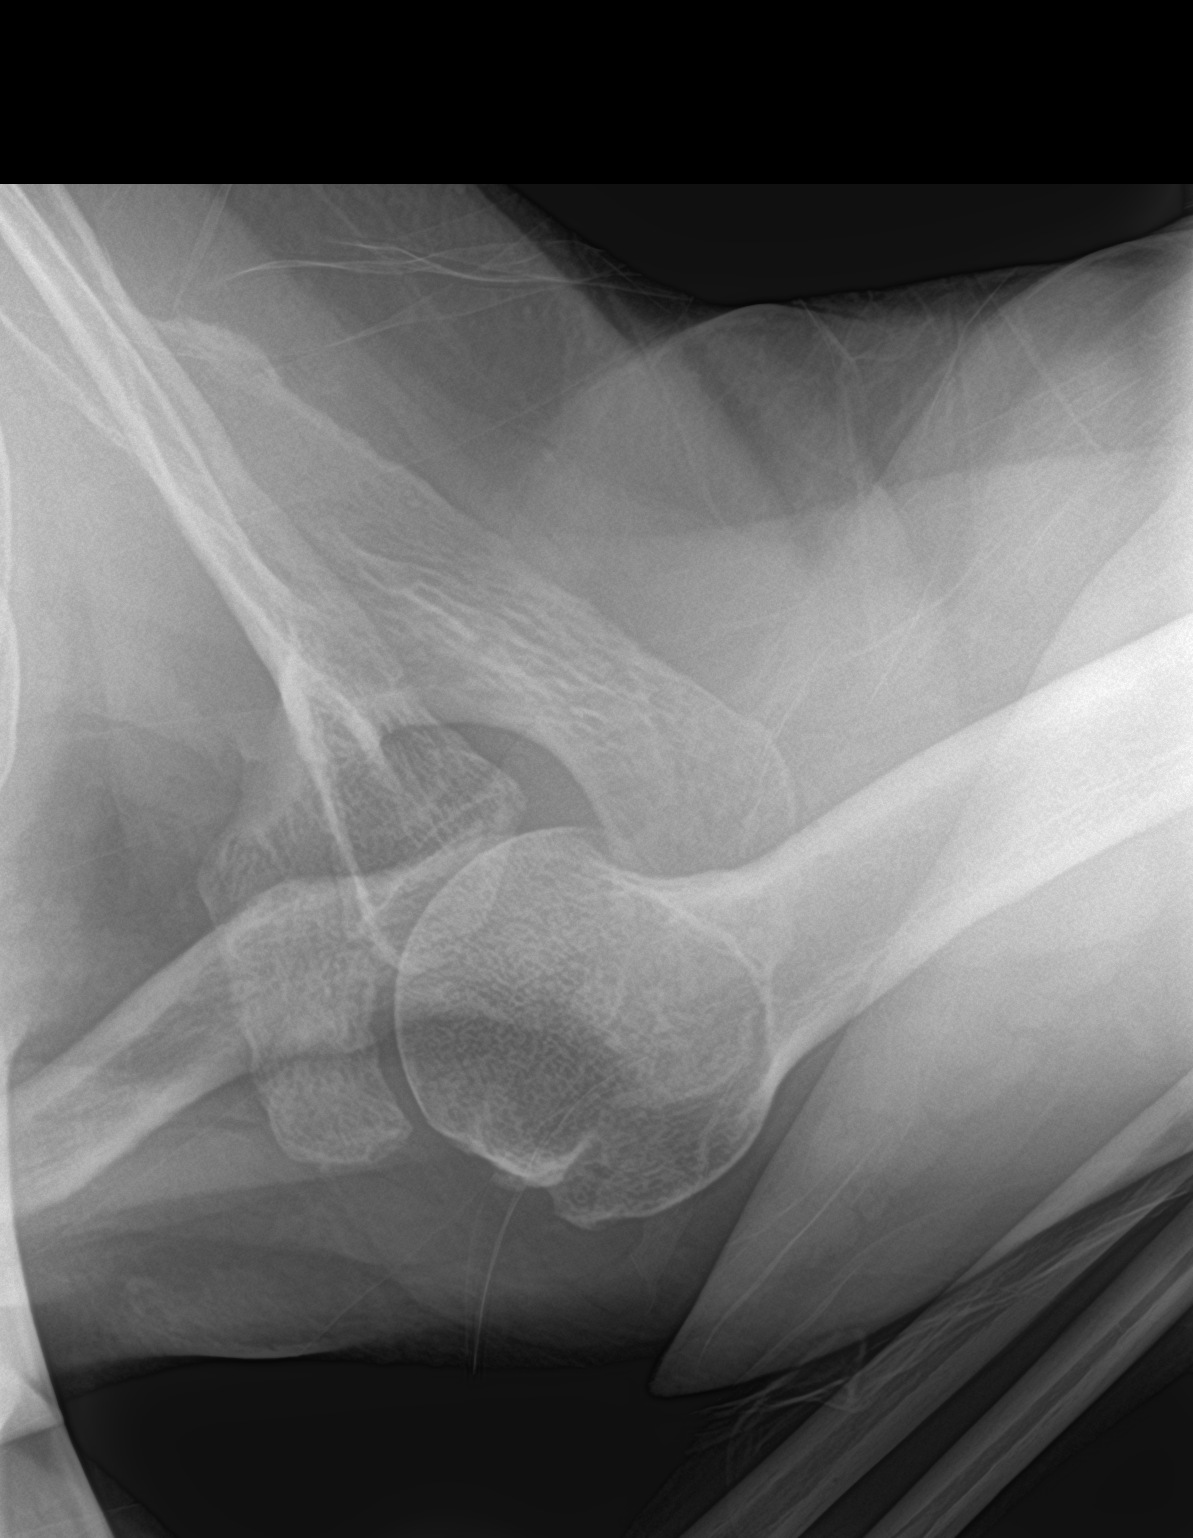

[3 of 3 positions shown; findings below may reference images not displayed]

FINDINGS: Frontal, Y scapular, and axillary images were obtained. No fracture
or dislocation. There is moderate generalized osteoarthritic change.
No erosive change or intra-articular calcification. Visualized left
lung is clear.
IMPRESSION: Moderate osteoarthritic change.  No fracture or dislocation.

## 2019-06-06 ENCOUNTER — Encounter: Payer: Self-pay | Admitting: Family Medicine

## 2019-06-06 ENCOUNTER — Ambulatory Visit (INDEPENDENT_AMBULATORY_CARE_PROVIDER_SITE_OTHER): Payer: BC Managed Care – PPO

## 2019-06-06 ENCOUNTER — Other Ambulatory Visit: Payer: Self-pay

## 2019-06-06 ENCOUNTER — Ambulatory Visit: Payer: BC Managed Care – PPO | Admitting: Family Medicine

## 2019-06-06 VITALS — BP 120/80 | HR 61 | Ht 64.0 in | Wt 167.0 lb

## 2019-06-06 DIAGNOSIS — G8929 Other chronic pain: Secondary | ICD-10-CM | POA: Diagnosis not present

## 2019-06-06 DIAGNOSIS — M25512 Pain in left shoulder: Secondary | ICD-10-CM

## 2019-06-06 DIAGNOSIS — M19012 Primary osteoarthritis, left shoulder: Secondary | ICD-10-CM | POA: Diagnosis not present

## 2019-06-06 DIAGNOSIS — M7552 Bursitis of left shoulder: Secondary | ICD-10-CM

## 2019-06-06 NOTE — Assessment & Plan Note (Signed)
Worsening exacerbation of this problem as well as a moderate osteoarthritic of the acromioclavicular joint.  Patient given injection of the wrist.  Patient denies any fracture.  Discussed medical management.  Social determinants of health including patient having work more frequently secondary to the coronavirus outbreak pandemic.  Patient will try all the different interventions and come back and see me again in 6 weeks

## 2019-06-06 NOTE — Progress Notes (Signed)
[facility] [address] [place] Phone: [phone] Subjective:   I Alexis Bradley am serving as a Education administrator for Dr. Hulan Saas.  This visit occurred during the SARS-CoV-2 public health emergency.  Safety protocols were in place, including screening questions prior to the visit, additional usage of staff PPE, and extensive cleaning of exam room while observing appropriate contact time as indicated for disinfecting solutions.   I'm seeing this patient by the request  of:  [name], MD  CC: Neck pain, left shoulder pain follow-up  [id number]   08/23/2018 Neck pain: Patient is doing significantly better at this point.  We discussed icing regimen and home exercise.  Discussed which activities of doing which wants to avoid.  Patient is to increase activity slowly.  As long as patient is well can follow-up as needed.  Update 06/06/2019 Alexis Bradley is a 54 y.o. female coming in with complaint of left shoulder pain. Patient states she can't sleep at night and has limited ROM. States she has some neck pain. Pain radiates distally. Loss of strength. Most of her pain is in the bicep and upper trap.  Left shoulder pain has had more of a rotator cuff tear and then had a frozen shoulder.  Has been doing relatively well and last injection was noted since 2019.  Patient states it is now starting to wake her up again at night, affecting daily activities,      Past Medical History:  Diagnosis Date  . Aortic insufficiency    a. 05/2010 Echo: EF 65-70%; mild to moderate AI (trileaflet AoV), mild MR, Asc Ao dilated to 3.7 cm; b. 01/2015 Echo: EF 60-65%, no rwma, mild to mod AI, nl Ao root, mild MR, PASP 18mmHg.  . Fatigue   . Gastric polyps March 2012   Endoscopy  . GERD (gastroesophageal reflux disease) Feb 2011   esophagitis by EGD Feb 2011  . Hiatal hernia   . Paroxysmal atrial tachycardia ([place])    a. Holter 2009 with PVCs and bigeminy. b.  Holter 1/12 with PVCs and short runs of atrial tachycardia (up to 9 beats). c. Holter 12/2014: rare PACs, rare short runs of narrow complex tachycardia c/w atrial tachycardia; d. 06/2016 Event monitor: runs of SVT.  Marland Kitchen PAT (paroxysmal atrial tachycardia) ([place])   . Poor circulation of extremity    Bil legs  . Premature atrial contractions   . PVC's (premature ventricular contractions)   . S/P left heart catheterization by percutaneous approach 2009   a. cath 11/2007: minor luminal irregularities, EF 60%.  . Sinus bradycardia    a. HR occasionally 40s per patient, rare dips to 30s.  . Varicose veins    Past Surgical History:  Procedure Laterality Date  . ABDOMINAL HYSTERECTOMY    . CARDIAC CATHETERIZATION  2009   Left heart cath  . CESAREAN SECTION    . CHOLECYSTECTOMY    . Right GSV ELAS with 10-20 stab phlebectomies right calf  05-25-11  . VESICOVAGINAL FISTULA CLOSURE W/ TAH     Social History   Socioeconomic History  . Marital status: Married    Spouse name: Not on file  . Number of children: Not on file  . Years of education: Not on file  . Highest education level: Not on file  Occupational History  . 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  Smoking status: Never Smoker  . Smokeless tobacco: Never Used  Substance and Sexual Activity  . Alcohol use: Yes    Alcohol/week: 3.0 standard drinks    Types: 3 Standard drinks or equivalent per week    Comment: Rarely  . Drug use: No  . Sexual activity: Not on file  Other Topics Concern  . Not on file  Social History Narrative   Married   Social Determinants of Health   Financial Resource Strain:   . Difficulty of Paying Living Expenses: Not on file  Food Insecurity:   . Worried About Charity fundraiser in the Last Year: Not on file  . Ran Out of Food in the Last Year: Not on file  Transportation Needs:   . Lack of  Transportation (Medical): Not on file  . Lack of Transportation (Non-Medical): Not on file  Physical Activity:   . Days of Exercise per Week: Not on file  . Minutes of Exercise per Session: Not on file  Stress:   . Feeling of Stress : Not on file  Social Connections:   . Frequency of Communication with Friends and Family: Not on file  . Frequency of Social Gatherings with Friends and Family: Not on file  . Attends Religious Services: Not on file  . Active Member of Clubs or Organizations: Not on file  . Attends Archivist Meetings: Not on file  . Marital Status: Not on file   Allergies  Allergen Reactions  . Tape Hives and Other (See Comments)    redness   Family History  Problem Relation Age of Onset  . Breast cancer Sister   . Breast cancer Maternal Grandmother   . Breast cancer Paternal Grandmother   . Heart disease Paternal Grandfather   . Stroke Other        Grandfather  . Coronary artery disease Neg Hx        Premature     Current Outpatient Medications (Cardiovascular):  Marland Kitchen  CARTIA XT 300 MG 24 hr capsule, TAKE 1 CAPSULE(300 MG) BY MOUTH DAILY .  propranolol (INDERAL) 20 MG tablet, Take 0.5 tablet (10 mg) by mouth three times a day as needed for palpitations   Current Outpatient Medications (Analgesics):  .  aspirin (ASPIRIN 81) 81 MG chewable tablet, Chew 81 mg by mouth daily.   Current Outpatient Medications (Other):  Marland Kitchen  Ascorbic Acid (VITAMIN C) 1000 MG tablet, Take 1,000 mg by mouth daily. .  Magnesium 250 MG TABS, Take 25 mg by mouth daily. Marland Kitchen  MAGNESIUM PO, Take 1 tablet by mouth daily.   .  Multiple Vitamin (MULTIVITAMIN) tablet, Take 1 tablet by mouth daily. Marland Kitchen  omeprazole (PRILOSEC) 20 MG capsule, Take 20 mg by mouth daily.    Reviewed prior external information including notes and imaging from  primary care provider As well as notes that were available from care everywhere and other healthcare systems.  Past medical history, social,  surgical and family history all reviewed in electronic medical record.  No pertanent information unless stated regarding to the chief complaint.   Review of Systems:  No headache, visual changes, nausea, vomiting, diarrhea, constipation, dizziness, abdominal pain, skin rash, fevers, chills, night sweats, weight loss, swollen lymph nodes, body aches, joint swelling, chest pain, shortness of breath, mood changes. POSITIVE muscle aches   Objective  Blood pressure 120/80, pulse 61, height 5\' 4"  (1.626 m), weight 167 lb (75.8 kg), SpO2 98 %.   General: No apparent distress alert and  oriented x3 mood and affect normal, dressed appropriately.  HEENT: Pupils equal, extraocular movements intact  Respiratory: Patient's speak in full sentences and does not appear short of breath  Cardiovascular: No lower extremity edema, non tender, no erythema  Skin: Warm dry intact with no signs of infection or rash on extremities or on axial skeleton.  Abdomen: Soft nontender  Neuro: Cranial nerves II through XII are intact, neurovascularly intact in all extremities with 2+ DTRs and 2+ pulses.  Lymph: No lymphadenopathy of posterior or anterior cervical chain or axillae bilaterally.  Gait normal with good balance and coordination.  MSK:  tender with full range of motion and good stability and symmetric strength and tone of  elbows, wrist, hip, knee and ankles bilaterally.  Left shoulder exam does have some positive impingement noted.  Patient does have positive crossover with tenderness over the acromioclavicular joint.   Procedure: Real-time Ultrasound Guided Injection of left glenohumeral joint Device: GE Logiq E  Ultrasound guided injection is preferred based studies that show increased duration, increased effect, greater accuracy, decreased procedural pain, increased response rate with ultrasound guided versus blind injection.  Verbal informed consent obtained.  Time-out conducted.  Noted no overlying erythema,  induration, or other signs of local infection.  Skin prepped in a sterile fashion.  Local anesthesia: Topical Ethyl chloride.  With sterile technique and under real time ultrasound guidance:  Joint visualized.  21g 2 inch needle inserted posterior approach. Pictures taken for needle placement. Patient did have injection of 2 cc of 0.5% Marcaine, and 1cc of Kenalog 40 mg/dL. Completed without difficulty  Pain immediately resolved suggesting accurate placement of the medication.  Advised to call if fevers/chills, erythema, induration, drainage, or persistent bleeding.  Images permanently stored and available for review in the ultrasound unit.  Impression: Technically successful ultrasound guided injection.  Procedure: Real-time Ultrasound Guided Injection of left acromioclavicular joint Device: GE Logiq Q7 Ultrasound guided injection is preferred based studies that show increased duration, increased effect, greater accuracy, decreased procedural pain, increased response rate, and decreased cost with ultrasound guided versus blind injection.  Verbal informed consent obtained.  Time-out conducted.  Noted no overlying erythema, induration, or other signs of local infection.  Skin prepped in a sterile fashion.  Local anesthesia: Topical Ethyl chloride.  With sterile technique and under real time ultrasound guidance: With a 25-gauge needle patient was injected with 0.5 cc of 0.5% Marcaine and 0.5 cc of 40 mg/mL of Kenalog. Completed without difficulty  Pain immediately resolved suggesting accurate placement of the medication.  Advised to call if fevers/chills, erythema, induration, drainage, or persistent bleeding.  Images permanently stored and available for review in the ultrasound unit.  Impression: Technically successful ultrasound guided injection.   Impression and Recommendations:     This case required medical decision making of moderate complexity. The above documentation has been  reviewed and is accurate and complete Lyndal Pulley, DO       Note: This dictation was prepared with Dragon dictation along with smaller phrase technology. Any transcriptional errors that result from this process are unintentional.

## 2019-06-06 NOTE — Patient Instructions (Signed)
Start exercises on Monday Keep hands within peripheral vision See me again in 6 weeks if not perfect

## 2019-06-06 NOTE — Assessment & Plan Note (Signed)
Patient given injection today and tolerated procedure well, discussed icing regimen and home exercise, discussed with activity to potentially avoid encouraged topical anti-inflammatories, follow-up again in 4 to 8 weeks

## 2019-07-13 ENCOUNTER — Encounter: Payer: Self-pay | Admitting: Family Medicine

## 2019-07-19 ENCOUNTER — Ambulatory Visit: Payer: BC Managed Care – PPO | Admitting: Family Medicine

## 2019-08-11 ENCOUNTER — Encounter: Payer: Self-pay | Admitting: Physician Assistant

## 2019-08-11 ENCOUNTER — Ambulatory Visit: Payer: BC Managed Care – PPO | Admitting: Physician Assistant

## 2019-08-11 ENCOUNTER — Ambulatory Visit (INDEPENDENT_AMBULATORY_CARE_PROVIDER_SITE_OTHER): Payer: BC Managed Care – PPO

## 2019-08-11 ENCOUNTER — Other Ambulatory Visit: Payer: Self-pay

## 2019-08-11 VITALS — BP 136/84 | HR 50 | Ht 64.0 in | Wt 160.5 lb

## 2019-08-11 DIAGNOSIS — I359 Nonrheumatic aortic valve disorder, unspecified: Secondary | ICD-10-CM

## 2019-08-11 DIAGNOSIS — I471 Supraventricular tachycardia: Secondary | ICD-10-CM | POA: Diagnosis not present

## 2019-08-11 DIAGNOSIS — I480 Paroxysmal atrial fibrillation: Secondary | ICD-10-CM

## 2019-08-11 DIAGNOSIS — I493 Ventricular premature depolarization: Secondary | ICD-10-CM | POA: Diagnosis not present

## 2019-08-11 DIAGNOSIS — R5383 Other fatigue: Secondary | ICD-10-CM

## 2019-08-11 NOTE — Progress Notes (Signed)
Office Visit    Patient Name: Alexis Bradley Date of Encounter: 08/11/2019  Primary Care Provider:  [name], MD Primary Cardiologist:  Kathlyn Sacramento, MD  Chief Complaint    Chief Complaint  Patient presents with  . Other    Paitent c.o heart racing and very tired. Meds reviewed verbally with patient.     54 yo female with history of palpitation, pSVT, PVCs, atrial fibrillation, nl cor by cath 2009, and here today for fatigue.  Past Medical History    Past Medical History:  Diagnosis Date  . Aortic insufficiency    a. 05/2010 Echo: EF 65-70%; mild to moderate AI (trileaflet AoV), mild MR, Asc Ao dilated to 3.7 cm; b. 01/2015 Echo: EF 60-65%, no rwma, mild to mod AI, nl Ao root, mild MR, PASP 38mmHg.  . Fatigue   . Gastric polyps March 2012   Endoscopy  . GERD (gastroesophageal reflux disease) Feb 2011   esophagitis by EGD Feb 2011  . Hiatal hernia   . Paroxysmal atrial tachycardia ([place])    a. Holter 2009 with PVCs and bigeminy. b. Holter 1/12 with PVCs and short runs of atrial tachycardia (up to 9 beats). c. Holter 12/2014: rare PACs, rare short runs of narrow complex tachycardia c/w atrial tachycardia; d. 06/2016 Event monitor: runs of SVT.  Marland Kitchen PAT (paroxysmal atrial tachycardia) ([place])   . Poor circulation of extremity    Bil legs  . Premature atrial contractions   . PVC's (premature ventricular contractions)   . S/P left heart catheterization by percutaneous approach 2009   a. cath 11/2007: minor luminal irregularities, EF 60%.  . Sinus bradycardia    a. HR occasionally 40s per patient, rare dips to 30s.  . Varicose veins    Past Surgical History:  Procedure Laterality Date  . ABDOMINAL HYSTERECTOMY    . CARDIAC CATHETERIZATION  2009   Left heart cath  . CESAREAN SECTION    . CHOLECYSTECTOMY    . Right GSV ELAS with 10-20 stab phlebectomies right calf  05-25-11  . VESICOVAGINAL FISTULA CLOSURE W/ TAH      Allergies  Allergies  Allergen Reactions  .  Tape Hives and Other (See Comments)    redness    History of Present Illness    Alexis Bradley is a 54 y.o. female with PMH as above. She has a history of brief atrial fibrillation documented. She underwent LHC 2009 that showed nl cors. Echo showed EF nl. She was treated in the past by BB but developed fatigue and bradycardia. She was switched to diltiazem wit improvement. She had worsening palpitations in 2019 with a brief run of atrial fibrillation, lasting only a few minutes. Echo showed nl LVSF, mild to moderate aortic insufficiency, mild MR, and borderline pulmonary HTN. 14 days Zio monitor showed NSR with recurrent episodes of SVT, lasting 13 seconds. She had a PE in 2019 in the setting of DVT related to ankle fracture and treated with 6 months anticoagulation. When last seen in the office, she reported she was doing well overall with stable palpitations, most of which occurred at night. She occasionally took propanolol. Since that time, she reports that she has been doing well until the past few days, in which she has felt fatigued. She reports continued occasional racing HR and palpitations, and for which she has been taking propanolol with last dose this morning. She denies siginificant SOB/DOE, abdominal distention, or early satiety. She reports medication compliance. No s/sx consistent  with bleeding.   Home Medications    Prior to Admission medications   Medication Sig Start Date End Date Taking? Authorizing Provider  Ascorbic Acid (VITAMIN C) 1000 MG tablet Take 1,000 mg by mouth daily.   Yes [provider]  aspirin (ASPIRIN 81) 81 MG chewable tablet Chew 81 mg by mouth daily.   Yes [provider]  CARTIA XT 300 MG 24 hr capsule TAKE 1 CAPSULE(300 MG) BY MOUTH DAILY 11/21/18  Yes Wellington Hampshire, MD  Magnesium 250 MG TABS Take 25 mg by mouth daily.   Yes [provider]  MAGNESIUM PO Take 1 tablet by mouth daily.     Yes [provider]  Multiple  Vitamin (MULTIVITAMIN) tablet Take 1 tablet by mouth daily.   Yes [provider]  omeprazole (PRILOSEC) 20 MG capsule Take 20 mg by mouth daily.  04/05/18 08/11/19 Yes [provider]  propranolol (INDERAL) 20 MG tablet Take 0.5 tablet (10 mg) by mouth three times a day as needed for palpitations 11/08/18  Yes Wellington Hampshire, MD    Review of Systems    She denies dyspnea, pnd, orthopnea, n, v, dizziness, CP, syncope, edema, weight gain, or early satiety. She reports fatigue, palpitations, and racing HR.   All other systems reviewed and are otherwise negative except as noted above.  Physical Exam    VS:  BP 136/84 (BP Location: Left Arm, Patient Position: Sitting, Cuff Size: Normal)   Pulse (!) 50   Ht 5\' 4"  (1.626 m)   Wt 160 lb 8 oz (72.8 kg)   SpO2 98%   BMI 27.55 kg/m  , BMI Body mass index is 27.55 kg/m. GEN: Well nourished, well developed, in no acute distress. HEENT: normal. Neck: Supple, no JVD, carotid bruits, or masses. Cardiac: RRR, 2/6 systolic murmur, rubs, or gallops. No clubbing, cyanosis, edema.  Radials/DP/PT 2+ and equal bilaterally.  Respiratory:  Respirations regular and unlabored, clear to auscultation bilaterally. GI: Soft, nontender, nondistended, BS + x 4. MS: no deformity or atrophy. Skin: warm and dry, no rash. Neuro:  Strength and sensation are intact. Psych: Normal affect.  Accessory Clinical Findings    ECG personally reviewed by me today - SB, 50bpm, poor conduction in III/avF (lead placement) - no acute changes.  VITALS Reviewed today   Temp Readings from Last 3 Encounters:  12/24/15 98 F (36.7 C) (Oral)  10/08/15 98 F (36.7 C) (Oral)  08/06/14 97.8 F (36.6 C) (Oral)   BP Readings from Last 3 Encounters:  08/11/19 136/84  06/06/19 120/80  03/21/19 128/74   Pulse Readings from Last 3 Encounters:  08/11/19 (!) 50  06/06/19 61  03/21/19 (!) 56    Wt Readings from Last 3 Encounters:  08/11/19 160 lb 8 oz (72.8 kg)   06/06/19 167 lb (75.8 kg)  03/21/19 168 lb 4 oz (76.3 kg)     LABS  reviewed today   CareEverwhere Labs present and most recent? Yes/No: No  Lab Results  Component Value Date   WBC 5.7 01/13/2018   HGB 14.2 01/13/2018   HCT 41.4 01/13/2018   MCV 93 01/13/2018   PLT 272 01/13/2018   Lab Results  Component Value Date   CREATININE 0.62 01/13/2018   BUN 11 01/13/2018   NA 142 01/13/2018   K 4.2 01/13/2018   CL 104 01/13/2018   CO2 27 01/13/2018   Lab Results  Component Value Date   ALT 19 07/15/2017   AST 20 07/15/2017  ALKPHOS 74 07/15/2017   BILITOT 0.3 07/15/2017   Lab Results  Component Value Date   CHOL 231 (H) 10/08/2015   HDL 106.90 10/08/2015   LDLCALC 101 (H) 10/08/2015   TRIG 112.0 10/08/2015   CHOLHDL 2 10/08/2015    Lab Results  Component Value Date   HGBA1C 5.6 09/08/2013   Lab Results  Component Value Date   TSH 4.430 01/13/2018     STUDIES/PROCEDURES reviewed today   Echo 12/14/2018 1. The left ventricle has normal systolic function with an ejection  fraction of 60-65%. The cavity size was normal. Left ventricular diastolic  parameters were normal. No evidence of left ventricular regional wall  motion abnormalities.  2. The right ventricle has normal systolic function. The cavity was  normal. There is no increase in right ventricular wall thickness. Right  ventricular systolic pressure is normal with an estimated pressure of 29.2  mmHg.  3. Left atrial size was mildly dilated.  4. The aortic valve is tricuspid. Aortic valve regurgitation is mild to  moderate by color flow Doppler.  5. The aorta is abnormal unless otherwise noted.  6. There is mild dilatation of the ascending aorta measuring 36 mm.  7. The interatrial septum was not well visualized.   02/11/2018 Monitoring Normal sinus rhythm with an average heart rate of 67 bpm. 26 episodes of SVT noted.  The longest episode lasted 13 seconds with a heart rate of 117 bpm.  Rare PVCs. Most of the patient's reported events of irregular heartbeats and dizziness did not correlate with arrhythmia.  Assessment & Plan    Fatigue --Consider as 2/2 racing HR, recent propanolol use, aortic stenosis, or elevated BP. Documented past history of atrial fibrillation with overall low burden and previously doing well on diltiazem and as needed propanolol. Given her recent worsening sx, repeat short course 7d Zio. Will also obtain Lexiscan after discussion with patient.  Also consider as 2/2 worsening valvular dz and could consider repeat echo at RTC.  Will obtain BMET, Mg, CBC, and TSH.   pSVT --Intermittent racing HR, palpitations. Last took propanolol this AM, leading to likely subsequent bradycardia today. Consider as influencing fatigue. Will obtain 7 day Zio to reassess Afib length and burden today.  HTN --BP somewhat elevated today. Consider as contributing to sx. Reassess at RTC. No room for escalation of current medications, given bradycardic rate. Could consider addition of ACE/ARB.  Aortic valve dz --2/6 systolic murmur heard on exam. Consider as contributing to sx. Repeat echo recommended in 2-3 years (2022-2023). Reassess at RTC.  Medication changes: None Labs ordered: BMET, CBC, Mg, TSH Studies / Imaging ordered: Lexiscan / MPI, Zio Future considerations: Echo. Increase antianginal / antihypertensive medications Disposition: RTC 1 month   Arvil Chaco, PA-C 08/11/2019

## 2019-08-11 NOTE — Patient Instructions (Addendum)
Medication Instructions:  No changes  *If you need a refill on your cardiac medications before your next appointment, please call your pharmacy*   Lab Work: CBC, BMET, Mag, TSH to be done today.  If you have labs (blood work) drawn today and your tests are completely normal, you will receive your results only by: Marland Kitchen MyChart Message (if you have MyChart) OR . A paper copy in the mail If you have any lab test that is abnormal or we need to change your treatment, we will call you to review the results.   Testing/Procedures: Dentist for 7 days Your physician has recommended that you wear a Zio monitor. This monitor is a medical device that records the heart's electrical activity. Doctors most often use these monitors to diagnose arrhythmias. Arrhythmias are problems with the speed or rhythm of the heartbeat. The monitor is a small device applied to your chest. You can wear one while you do your normal daily activities. While wearing this monitor if you have any symptoms to push the button and record what you felt. Once you have worn this monitor for the period of time provider prescribed (Usually 14 days), you will return the monitor device in the postage paid box. Once it is returned they will download the data collected and provide [place] with a report which the provider will then review and we will call you with those results. Important tips:  1. Avoid showering during the first 24 hours of wearing the monitor. 2. Avoid excessive sweating to help maximize wear time. 3. Do not submerge the device, no hot tubs, and no swimming pools. 4. Keep any lotions or oils away from the patch. 5. After 24 hours you may shower with the patch on. Take brief showers with your back facing the shower head.  6. Do not remove patch once it has been placed because that will interrupt data and decrease adhesive wear time. 7. Push the button when you have any symptoms and write down what you were feeling. 8. Once you  have completed wearing your monitor, remove and place into box which has postage paid and place in your outgoing mailbox.  9. If for some reason you have misplaced your box then call our office and we can provide another box and/or mail it off for you.      [place]  Your caregiver has ordered a Stress Test with nuclear imaging. The purpose of this test is to evaluate the blood supply to your heart muscle. This procedure is referred to as a "Non-Invasive Stress Test." This is because other than having an IV started in your vein, nothing is inserted or "invades" your body. Cardiac stress tests are done to find areas of poor blood flow to the heart by determining the extent of coronary artery disease (CAD). Some patients exercise on a treadmill, which naturally increases the blood flow to your heart, while others who are  unable to walk on a treadmill due to physical limitations have a pharmacologic/chemical stress agent called Lexiscan . This medicine will mimic walking on a treadmill by temporarily increasing your coronary blood flow.   Please note: these test may take anywhere between 2-4 hours to complete  PLEASE REPORT TO [place] AT THE FIRST DESK WILL DIRECT YOU WHERE TO GO  Date of Procedure:_____________________________________  Arrival Time for Procedure:______________________________    PLEASE NOTIFY THE OFFICE AT LEAST 24 HOURS IN ADVANCE IF YOU ARE UNABLE TO KEEP  YOUR APPOINTMENT.  [phone] AND  PLEASE NOTIFY NUCLEAR MEDICINE AT [facility] AT LEAST 24 HOURS IN ADVANCE IF YOU ARE UNABLE TO KEEP YOUR APPOINTMENT. [phone]  How to prepare for your Myoview test:  1. Do not eat or drink after midnight 2. No caffeine for 24 hours prior to test 3. No smoking 24 hours prior to test. 4. Your medication may be taken with water.  If your doctor stopped a medication because of this test, do not take that medication. 5. Ladies, please do not wear  dresses.  Skirts or pants are appropriate. Please wear a short sleeve shirt. 6. No perfume, cologne or lotion. 7. Wear comfortable walking shoes. No heels!   Follow-Up: At [facility], you and your health needs are our priority.  As part of our continuing mission to provide you with exceptional heart care, we have created designated Provider Care Teams.  These Care Teams include your primary Cardiologist (physician) and Advanced Practice Providers (APPs -  Physician Assistants and Nurse Practitioners) who all work together to provide you with the care you need, when you need it.   Your next appointment:   1 month(s)  The format for your next appointment:   In Person  Provider:    You may see Kathlyn Sacramento, MD or one of the following Advanced Practice Providers on your designated Care Team:    Murray Hodgkins, NP  Christell Faith, PA-C  Marrianne Mood, PA-C  Aortic Dilation: Research has raised concern that antibiotics in the fluoroquinolone class could be associated with increased risk of having an aortic aneurysm develop or tear. This includes medicines that end in "floxacin," like Cipro or Levaquin. Make sure to discuss this information with other healthcare providers if you require antibiotics.  Since aneurysms can run in families, you should discuss your diagnosis with first degree relatives as they may need to be screened for this. Regular mild-moderate physical exercise is important, but avoid heavy lifting/weight lifting over 30lbs, chopping wood, shoveling snow or digging heavy earth with a shovel. It is best to avoid activities that cause grunting or straining (medically referred to as a "Valsalva maneuver"). This happens when a person bears down against a closed throat to increase the strength of arm or abdominal muscles. There's often a tendency to do this when lifting heavy weights, doing sit-ups, push-ups or chin-ups, etc., but it may be harmful.

## 2019-08-12 LAB — MAGNESIUM: Magnesium: 2.1 mg/dL (ref 1.6–2.3)

## 2019-08-12 LAB — CBC
Hematocrit: 42.1 % (ref 34.0–46.6)
Hemoglobin: 14.4 g/dL (ref 11.1–15.9)
MCH: 32.4 pg (ref 26.6–33.0)
MCHC: 34.2 g/dL (ref 31.5–35.7)
MCV: 95 fL (ref 79–97)
Platelets: 293 10*3/uL (ref 150–450)
RBC: 4.45 x10E6/uL (ref 3.77–5.28)
RDW: 13 % (ref 11.7–15.4)
WBC: 5 10*3/uL (ref 3.4–10.8)

## 2019-08-12 LAB — BASIC METABOLIC PANEL
BUN/Creatinine Ratio: 16 (ref 9–23)
BUN: 11 mg/dL (ref 6–24)
CO2: 23 mmol/L (ref 20–29)
Calcium: 9.9 mg/dL (ref 8.7–10.2)
Chloride: 103 mmol/L (ref 96–106)
Creatinine, Ser: 0.69 mg/dL (ref 0.57–1.00)
GFR calc Af Amer: 113 mL/min/{1.73_m2} (ref >59)
GFR calc non Af Amer: 98 mL/min/{1.73_m2} (ref >59)
Glucose: 107 mg/dL — ABNORMAL HIGH (ref 65–99)
Potassium: 4.3 mmol/L (ref 3.5–5.2)
Sodium: 143 mmol/L (ref 134–144)

## 2019-08-12 LAB — TSH: TSH: 3.51 u[IU]/mL (ref 0.450–4.500)

## 2019-08-29 ENCOUNTER — Other Ambulatory Visit: Payer: BC Managed Care – PPO

## 2019-09-14 ENCOUNTER — Encounter: Payer: Self-pay | Admitting: Cardiovascular Disease

## 2019-09-14 ENCOUNTER — Other Ambulatory Visit: Payer: Self-pay

## 2019-09-14 ENCOUNTER — Ambulatory Visit (INDEPENDENT_AMBULATORY_CARE_PROVIDER_SITE_OTHER): Payer: BC Managed Care – PPO | Admitting: Cardiovascular Disease

## 2019-09-14 VITALS — BP 132/74 | HR 57 | Ht 64.0 in | Wt 158.5 lb

## 2019-09-14 DIAGNOSIS — I493 Ventricular premature depolarization: Secondary | ICD-10-CM | POA: Diagnosis not present

## 2019-09-14 DIAGNOSIS — I471 Supraventricular tachycardia: Secondary | ICD-10-CM | POA: Diagnosis not present

## 2019-09-14 DIAGNOSIS — I359 Nonrheumatic aortic valve disorder, unspecified: Secondary | ICD-10-CM | POA: Diagnosis not present

## 2019-09-14 NOTE — Patient Instructions (Signed)

## 2019-09-14 NOTE — Progress Notes (Signed)
Cardiology Office Note   Date:  09/14/2019   ID:  Alexis Bradley, Alexis Bradley 11-Nov-1965, MRN [id number]  PCP:  Elaina Pattee, MD  Cardiologist:   Kathlyn Sacramento, MD   Chief Complaint  Patient presents with  . other    1 month f/u zio. Meds reviewed verbally with pt.      History of Present Illness: Alexis Bradley is a 54 y.o. female who presents for a follow-up visit  regarding palpitations due to paroxysmal SVT and PVCs.  She also had documented episodes of brief atrial fibrillation. She had cardiac catheterization 2009 which showed no significant coronary artery disease. EF is normal. She was treated in the past with metoprolol but she developed fatigue and bradycardia. She was switched to diltiazem with subsequent improvement. She had worsening palpitations in September 2019 with a documented brief atrial fibrillation lasting few minutes.  Her labs were unremarkable.  Echocardiogram showed normal LV systolic function, mild to moderate aortic insufficiency, mild mitral regurgitation, and borderline pulmonary hypertension.  14 days ZIO monitor showed sinus rhythm with recurrent episodes of SVT the longest lasted 13 seconds.  There was no evidence of atrial fibrillation. She had pulmonary embolism last year in the setting of DVT related to ankle fracture.  She was treated with 6 months of anticoagulation.  She was seen recently by [facility] for increased fatigue and an episode of palpitations.  Her labs were unremarkable including TSH.  Outpatient monitor showed brief runs of SVT the longest lasted 16 seconds.  Some of the triggered events did not correlate with arrhythmia.  The patient denies any chest pain or shortness of breath.  She is feeling back to baseline.    Past Medical History:  Diagnosis Date  . Aortic insufficiency    a. 05/2010 Echo: EF 65-70%; mild to moderate AI (trileaflet AoV), mild MR, Asc Ao dilated to 3.7 cm; b. 01/2015 Echo: EF 60-65%, no rwma, mild to mod AI,  nl Ao root, mild MR, PASP 31mmHg.  . Fatigue   . Gastric polyps March 2012   Endoscopy  . GERD (gastroesophageal reflux disease) Feb 2011   esophagitis by EGD Feb 2011  . Hiatal hernia   . Paroxysmal atrial tachycardia ([place])    a. Holter 2009 with PVCs and bigeminy. b. Holter 1/12 with PVCs and short runs of atrial tachycardia (up to 9 beats). c. Holter 12/2014: rare PACs, rare short runs of narrow complex tachycardia c/w atrial tachycardia; d. 06/2016 Event monitor: runs of SVT.  Marland Kitchen PAT (paroxysmal atrial tachycardia) ([place])   . Poor circulation of extremity    Bil legs  . Premature atrial contractions   . PVC's (premature ventricular contractions)   . S/P left heart catheterization by percutaneous approach 2009   a. cath 11/2007: minor luminal irregularities, EF 60%.  . Sinus bradycardia    a. HR occasionally 40s per patient, rare dips to 30s.  . Varicose veins     Past Surgical History:  Procedure Laterality Date  . ABDOMINAL HYSTERECTOMY    . CARDIAC CATHETERIZATION  2009   Left heart cath  . CESAREAN SECTION    . CHOLECYSTECTOMY    . Right GSV ELAS with 10-20 stab phlebectomies right calf  05-25-11  . VESICOVAGINAL FISTULA CLOSURE W/ TAH       Current Outpatient Medications  Medication Sig Dispense Refill  . Ascorbic Acid (VITAMIN C) 1000 MG tablet Take 1,000 mg by mouth daily.    Marland Kitchen aspirin (ASPIRIN 81) 81  MG chewable tablet Chew 81 mg by mouth daily.    Marland Kitchen CARTIA XT 300 MG 24 hr capsule TAKE 1 CAPSULE(300 MG) BY MOUTH DAILY 90 capsule 3  . Magnesium 250 MG TABS Take 25 mg by mouth daily.    . Multiple Vitamin (MULTIVITAMIN) tablet Take 1 tablet by mouth daily.    Marland Kitchen omeprazole (PRILOSEC) 20 MG capsule Take 20 mg by mouth daily.     . propranolol (INDERAL) 20 MG tablet Take 0.5 tablet (10 mg) by mouth three times a day as needed for palpitations 30 tablet 3   No current facility-administered medications for this visit.    Allergies:   Tape    Social History:  The  patient  reports that she has never smoked. She has never used smokeless tobacco. She reports current alcohol use of about 3.0 standard drinks of alcohol per week. She reports that she does not use drugs.   Family History:  The patient's family history includes Breast cancer in her maternal grandmother, paternal grandmother, and sister; Heart disease in her paternal grandfather; Stroke in an other family member.    ROS:  Please see the history of present illness.   Otherwise, review of systems are positive for none.   All other systems are reviewed and negative.    PHYSICAL EXAM: VS:  BP 132/74 (BP Location: Left Arm, Patient Position: Sitting, Cuff Size: Normal)   Pulse (!) 57   Ht 5\' 4"  (1.626 m)   Wt 158 lb 8 oz (71.9 kg)   SpO2 98%   BMI 27.21 kg/m  , BMI Body mass index is 27.21 kg/m. GEN: Well nourished, well developed, in no acute distress  HEENT: normal  Neck: no JVD, carotid bruits, or masses Cardiac: RRR; no rubs, or gallops,no edema . 2/6 systolic ejection murmur in the aortic area. Respiratory:  clear to auscultation bilaterally, normal work of breathing GI: soft, nontender, nondistended, + BS MS: no deformity or atrophy  Skin: warm and dry, no rash Neuro:  Strength and sensation are intact Psych: euthymic mood, full affect   EKG:  EKG is ordered today. The ekg ordered today demonstrates sinus bradycardia with sinus arrhythmia.   Recent Labs: 08/11/2019: BUN 11; Creatinine, Ser 0.69; Hemoglobin 14.4; Magnesium 2.1; Platelets 293; Potassium 4.3; Sodium 143; TSH 3.510    Lipid Panel    Component Value Date/Time   CHOL 231 (H) 10/08/2015 1519   TRIG 112.0 10/08/2015 1519   HDL 106.90 10/08/2015 1519   CHOLHDL 2 10/08/2015 1519   VLDL 22.4 10/08/2015 1519   LDLCALC 101 (H) 10/08/2015 1519      Wt Readings from Last 3 Encounters:  09/14/19 158 lb 8 oz (71.9 kg)  08/11/19 160 lb 8 oz (72.8 kg)  06/06/19 167 lb (75.8 kg)       No flowsheet data  found.    ASSESSMENT AND PLAN:   1. Paroxysmal SVT: She also had documented atrial fibrillation with overall low burden.  She continues to have brief palpitations but these episodes are not lasting long and symptoms are overall well controlled with diltiazem.  No chest pain or shortness of breath.  Fatigue has improved.  No need for further work-up at the present time.    2.  Aortic valve disease: Most recent echocardiogram in August of 2020 showed normal LV systolic function with mild to moderate aortic regurgitation.  She does have a systolic murmur in the aortic area.  In addition, the ascending aorta was borderline dilated  at 36 mmHg.  The plan is to repeat echocardiogram in August 2022.  Disposition:   FU with me in 12 months  Signed,  Kathlyn Sacramento, MD  09/14/2019 11:41 AM    [place]

## 2019-11-16 ENCOUNTER — Other Ambulatory Visit: Payer: Self-pay | Admitting: Cardiovascular Disease

## 2019-11-26 ENCOUNTER — Other Ambulatory Visit: Payer: Self-pay | Admitting: Cardiovascular Disease

## 2020-02-19 ENCOUNTER — Encounter: Payer: Self-pay | Admitting: Family Medicine

## 2020-06-06 NOTE — Progress Notes (Signed)
[place] Phone: [phone] Subjective:   [name], am serving as a scribe for Dr. Hulan Saas. This visit occurred during the SARS-CoV-2 public health emergency.  Safety protocols were in place, including screening questions prior to the visit, additional usage of staff PPE, and extensive cleaning of exam room while observing appropriate contact time as indicated for disinfecting solutions.  I'm seeing this patient by the request  of:  Elaina Pattee, MD  CC: Left shoulder, left ankle pain  [id number]   06/06/2019 Patient given injection today and tolerated procedure well, discussed icing regimen and home exercise, discussed with activity to potentially avoid encouraged topical anti-inflammatories, follow-up again in 4 to 8 weeks  Worsening exacerbation of this problem as well as a moderate osteoarthritic of the acromioclavicular joint.  Patient given injection of the wrist.  Patient denies any fracture.  Discussed medical management.  Social determinants of health including patient having work more frequently secondary to the coronavirus outbreak pandemic.  Patient will try all the different interventions and come back and see me again in 6 weeks  Update 06/11/2020 Alexis Bradley is a 55 y.o. female coming in with complaint of left shoulder pain. Patient states that pain is in left trap for past 2 months. Takes Advil for pain which occurs especially at night. Pain with overhead movements. Some neck pain. Denies any tingling.   Also complaining of left medial ankle pain after walking a lot. Pain is burning in character. History of ankle fracture 2020.   Cervical xray 06/2017  IMPRESSION: 1. Mild degenerative changes of the lower cervical spine. No acute osseous abnormality.  IMPRESSION: Moderate acromioclavicular osteoarthritis, similar to prior study.    Past Medical History:  Diagnosis Date  .  Aortic insufficiency    a. 05/2010 Echo: EF 65-70%; mild to moderate AI (trileaflet AoV), mild MR, Asc Ao dilated to 3.7 cm; b. 01/2015 Echo: EF 60-65%, no rwma, mild to mod AI, nl Ao root, mild MR, PASP 4mmHg.  . Fatigue   . Gastric polyps March 2012   Endoscopy  . GERD (gastroesophageal reflux disease) Feb 2011   esophagitis by EGD Feb 2011  . Hiatal hernia   . Paroxysmal atrial tachycardia ([place])    a. Holter 2009 with PVCs and bigeminy. b. Holter 1/12 with PVCs and short runs of atrial tachycardia (up to 9 beats). c. Holter 12/2014: rare PACs, rare short runs of narrow complex tachycardia c/w atrial tachycardia; d. 06/2016 Event monitor: runs of SVT.  Marland Kitchen PAT (paroxysmal atrial tachycardia) ([place])   . Poor circulation of extremity    Bil legs  . Premature atrial contractions   . PVC's (premature ventricular contractions)   . S/P left heart catheterization by percutaneous approach 2009   a. cath 11/2007: minor luminal irregularities, EF 60%.  . Sinus bradycardia    a. HR occasionally 40s per patient, rare dips to 30s.  . Varicose veins    Past Surgical History:  Procedure Laterality Date  . ABDOMINAL HYSTERECTOMY    . CARDIAC CATHETERIZATION  2009   Left heart cath  . CESAREAN SECTION    . CHOLECYSTECTOMY    . Right GSV ELAS with 10-20 stab phlebectomies right calf  05-25-11  . VESICOVAGINAL FISTULA CLOSURE W/ TAH     Social History   Socioeconomic History  . Marital status: Married    Spouse name: Not on file  . Number of children: Not on  file  . Years of education: Not on file  . Highest education level: Not on file  Occupational History  . 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 Smoking status: Never Smoker  . Smokeless tobacco: Never Used  Substance and Sexual Activity  . Alcohol use: Yes    Alcohol/week: 3.0 standard drinks    Types: 3 Standard drinks or equivalent per  week    Comment: Rarely  . Drug use: No  . Sexual activity: Not on file  Other Topics Concern  . Not on file  Social History Narrative   Married   Social Determinants of Health   Financial Resource Strain: Not on file  Food Insecurity: Not on file  Transportation Needs: Not on file  Physical Activity: Not on file  Stress: Not on file  Social Connections: Not on file   Allergies  Allergen Reactions  . Tape Hives and Other (See Comments)    redness   Family History  Problem Relation Age of Onset  . Breast cancer Sister   . Breast cancer Maternal Grandmother   . Breast cancer Paternal Grandmother   . Heart disease Paternal Grandfather   . Stroke Other        Grandfather  . Coronary artery disease Neg Hx        Premature     Current Outpatient Medications (Cardiovascular):  .  diltiazem (CARDIZEM CD) 300 MG 24 hr capsule, TAKE 1 CAPSULE(300 MG) BY MOUTH DAILY .  propranolol (INDERAL) 20 MG tablet, TAKE 1/2 TABLET BY MOUTH THREE TIMES DAILY AS NEEDED FOR PALPITATIONS .  propranolol (INDERAL) 20 MG tablet, TAKE 1/2 TABLET BY MOUTH THREE TIMES DAILY AS NEEDED FOR PALPITATIONS   Current Outpatient Medications (Analgesics):  .  aspirin 81 MG chewable tablet, Chew 81 mg by mouth daily.   Current Outpatient Medications (Other):  Marland Kitchen  Ascorbic Acid (VITAMIN C) 1000 MG tablet, Take 1,000 mg by mouth daily. .  Multiple Vitamin (MULTIVITAMIN) tablet, Take 1 tablet by mouth daily. .  Magnesium 250 MG TABS, Take 25 mg by mouth daily. Marland Kitchen  omeprazole (PRILOSEC) 20 MG capsule, Take 20 mg by mouth daily.    Reviewed prior external information including notes and imaging from  primary care provider As well as notes that were available from care everywhere and other healthcare systems.  Past medical history, social, surgical and family history all reviewed in electronic medical record.  No pertanent information unless stated regarding to the chief complaint.   Review of Systems:  No  headache, visual changes, nausea, vomiting, diarrhea, constipation, dizziness, abdominal pain, skin rash, fevers, chills, night sweats, weight loss, swollen lymph nodes, body aches, joint swelling, chest pain, shortness of breath, mood changes. POSITIVE muscle aches  Objective  Blood pressure 130/84, pulse 64, height 5\' 4"  (1.626 m), weight 162 lb (73.5 kg), SpO2 98 %.   General: No apparent distress alert and oriented x3 mood and affect normal, dressed appropriately.  HEENT: Pupils equal, extraocular movements intact  Respiratory: Patient's speak in full sentences and does not appear short of breath  Cardiovascular: Trace lower extremity edema, non tender, no erythema  Gait normal with good balance and coordination.  MSK: Left shoulder exam shows the patient does have good range of motion but does have new onset of weakness of the shoulder with rotator cuff and empty can.  3+ out of 5 strength compared  to contralateral side.  Significantly different than patient's previous exam.  Patient does have very mild limited range of motion with crossover.  Neurovascularly intact distally.  Left ankle does have some arthritic changes.  Pes planus noted with overpronation of the hindfoot.  Patient does have trace edema of the lower extremity.  Does have varicose veins noted with broken capillaries on the medial aspect of the ankle.  Minorly tender at the ankle joint on the medial aspect but negative Tinel's.  Neurovascularly intact distally.  Limited musculoskeletal ultrasound was performed and interpreted by Lyndal Pulley  Limited ultrasound of patient's rotator cuff of the left shoulder shows the patient does have what appears to be an chronic tear of the supraspinatus noted.  Mild potential retraction noted and calcific changes.  Patient continues to have moderate arthritic changes of the glenohumeral joint. Impression: New questionable supraspinatus tear.   [id number]; 15 additional minutes spent for  Therapeutic exercises as stated in above notes.  This included exercises focusing on stretching, strengthening, with significant focus on eccentric aspects.   Long term goals include an improvement in range of motion, strength, endurance as well as avoiding reinjury. Patient's frequency would include in 1-2 times a day, 3-5 times a week for a duration of 6-12 weeks. Ankle strengthening that included:  Basic range of motion exercises to allow proper full motion at ankle Stretching of the lower leg and hamstrings  Theraband exercises for the lower leg - inversion, eversion, dorsiflexion and plantarflexion each to be completed with a theraband Balance exercises to increase proprioception Weight bearing exercises to increase strength and balance  Proper technique shown and discussed handout in great detail with ATC.  All questions were discussed and answered.     Impression and Recommendations:     The above documentation has been reviewed and is accurate and complete Lyndal Pulley, DO

## 2020-06-11 ENCOUNTER — Ambulatory Visit (INDEPENDENT_AMBULATORY_CARE_PROVIDER_SITE_OTHER): Payer: BC Managed Care – PPO

## 2020-06-11 ENCOUNTER — Encounter: Payer: Self-pay | Admitting: Family Medicine

## 2020-06-11 ENCOUNTER — Other Ambulatory Visit: Payer: Self-pay

## 2020-06-11 ENCOUNTER — Ambulatory Visit: Payer: BC Managed Care – PPO | Admitting: Family Medicine

## 2020-06-11 ENCOUNTER — Ambulatory Visit: Payer: Self-pay

## 2020-06-11 VITALS — BP 130/84 | HR 64 | Ht 64.0 in | Wt 162.0 lb

## 2020-06-11 DIAGNOSIS — M25572 Pain in left ankle and joints of left foot: Secondary | ICD-10-CM | POA: Diagnosis not present

## 2020-06-11 DIAGNOSIS — M7552 Bursitis of left shoulder: Secondary | ICD-10-CM

## 2020-06-11 DIAGNOSIS — M19012 Primary osteoarthritis, left shoulder: Secondary | ICD-10-CM | POA: Diagnosis not present

## 2020-06-11 DIAGNOSIS — M2550 Pain in unspecified joint: Secondary | ICD-10-CM

## 2020-06-11 DIAGNOSIS — M25512 Pain in left shoulder: Secondary | ICD-10-CM

## 2020-06-11 DIAGNOSIS — G8929 Other chronic pain: Secondary | ICD-10-CM | POA: Diagnosis not present

## 2020-06-11 NOTE — Assessment & Plan Note (Signed)
Left ankle pain.  Patient has what appears to be some underlying arthritis.  Patient does have pes planus noted.  Discussed more of a rocker-bottom shoe, patient has had more of a burning sensation.  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  We will get a D-dimer to further evaluate.  If elevated will get Doppler.  Patient knows the signs and symptoms with the pulmonary embolism previously and would seek medical attention immediately.  I do think it is still low likelihood with patient also taken a baby aspirin already.  Discussed compression, topical anti-inflammatories, home exercises, proper shoes, follow-up again in 4 to 6 weeks

## 2020-06-11 NOTE — Patient Instructions (Signed)
Xrays today MRI [facility] [phone] [facility] in house [place] or [employer] today as well See me again in 4-6 weeks

## 2020-06-11 NOTE — Assessment & Plan Note (Signed)
Patient has had difficulty with her shoulder.  Today the patient is having some weakness noted.  Concerned that patient actually has a new rotator cuff tear.  Patient has had one previously but did respond very well to the nitroglycerin.  At this time though secondary to the weakness I do feel advanced imaging is warranted.  MR arthrogram ordered today.  Patient has had x-rays only showing moderate arthritic changes of the shoulder that are now 55 years old.  We will get new x-rays but do feel that the advanced imaging would be warranted.  Patient could be a good surgical candidate if necessary otherwise will come back for injections and physical therapy.

## 2020-06-12 LAB — D-DIMER, QUANTITATIVE: D-Dimer, Quant: <0.19 mcg/mL FEU (ref <0.50)

## 2020-07-08 ENCOUNTER — Ambulatory Visit
Admission: RE | Admit: 2020-07-08 | Discharge: 2020-07-08 | Disposition: A | Discharge status: Home / Self Care | Payer: BC Managed Care – PPO | Source: Ambulatory Visit | Attending: Family Medicine | Admitting: Family Medicine

## 2020-07-08 ENCOUNTER — Other Ambulatory Visit: Payer: Self-pay

## 2020-07-08 DIAGNOSIS — M19012 Primary osteoarthritis, left shoulder: Secondary | ICD-10-CM

## 2020-07-08 MED ORDER — IOPAMIDOL (ISOVUE-M 200) INJECTION 41%
15.0000 mL | Freq: Once | INTRAMUSCULAR | Status: AC
  Administered 2020-07-08: 15 mL via INTRA_ARTICULAR

## 2020-07-09 ENCOUNTER — Encounter: Payer: Self-pay | Admitting: Family Medicine

## 2020-07-09 ENCOUNTER — Other Ambulatory Visit: Payer: Self-pay

## 2020-07-09 DIAGNOSIS — M25519 Pain in unspecified shoulder: Secondary | ICD-10-CM

## 2020-07-10 ENCOUNTER — Encounter: Payer: Self-pay | Admitting: Family Medicine

## 2020-07-12 ENCOUNTER — Ambulatory Visit: Payer: BC Managed Care – PPO | Admitting: Family Medicine

## 2020-07-29 ENCOUNTER — Ambulatory Visit: Payer: BC Managed Care – PPO | Admitting: Family Medicine

## 2020-07-29 NOTE — Progress Notes (Signed)
[place] Phone: [phone] Subjective:   Alexis Bradley, am serving as a scribe for Dr. Hulan Saas. This visit occurred during the SARS-CoV-2 public health emergency.  Safety protocols were in place, including screening questions prior to the visit, additional usage of staff PPE, and extensive cleaning of exam room while observing appropriate contact time as indicated for disinfecting solutions.   I'm seeing this patient by the request  of:  Elaina Pattee, MD  CC: Left shoulder pain follow-up  [id number]  Alexis Bradley is a 55 y.o. female coming in with complaint of left shoulder pain.  Found to have a rotator cuff tear.  Patient was sent to orthopedic surgery to discuss visit. Pain continues in L shoulder.      Patient did have an MRI of the left shoulder done July 09, 2020.  Patient still has severe tendinosis of the supraspinatus with a large full-thickness tear with 15 mm of retraction.  Past Medical History:  Diagnosis Date  . Aortic insufficiency    a. 05/2010 Echo: EF 65-70%; mild to moderate AI (trileaflet AoV), mild MR, Asc Ao dilated to 3.7 cm; b. 01/2015 Echo: EF 60-65%, Bradley rwma, mild to mod AI, nl Ao root, mild MR, PASP 93mmHg.  . Fatigue   . Gastric polyps March 2012   Endoscopy  . GERD (gastroesophageal reflux disease) Feb 2011   esophagitis by EGD Feb 2011  . Hiatal hernia   . Paroxysmal atrial tachycardia ([place])    a. Holter 2009 with PVCs and bigeminy. b. Holter 1/12 with PVCs and short runs of atrial tachycardia (up to 9 beats). c. Holter 12/2014: rare PACs, rare short runs of narrow complex tachycardia c/w atrial tachycardia; d. 06/2016 Event monitor: runs of SVT.  Marland Kitchen PAT (paroxysmal atrial tachycardia) ([place])   . Poor circulation of extremity    Bil legs  . Premature atrial contractions   . PVC's (premature ventricular contractions)   . S/P left heart catheterization by  percutaneous approach 2009   a. cath 11/2007: minor luminal irregularities, EF 60%.  . Sinus bradycardia    a. HR occasionally 40s per patient, rare dips to 30s.  . Varicose veins    Past Surgical History:  Procedure Laterality Date  . ABDOMINAL HYSTERECTOMY    . CARDIAC CATHETERIZATION  2009   Left heart cath  . CESAREAN SECTION    . CHOLECYSTECTOMY    . Right GSV ELAS with 10-20 stab phlebectomies right calf  05-25-11  . VESICOVAGINAL FISTULA CLOSURE W/ TAH     Social History   Socioeconomic History  . Marital status: Married    Spouse name: Not on file  . Number of children: Not on file  . Years of education: Not on file  . Highest education level: Not on file  Occupational History  . 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 Smoking status: Never Smoker  . Smokeless tobacco: Never Used  Substance and Sexual Activity  . Alcohol use: Yes    Alcohol/week: 3.0 standard drinks    Types: 3 Standard drinks or equivalent per week    Comment: Rarely  . Drug use: Bradley  . Sexual activity: Not on file  Other Topics Concern  . Not on file  Social History Narrative   Married   Social Determinants of Health  Financial Resource Strain: Not on file  Food Insecurity: Not on file  Transportation Needs: Not on file  Physical Activity: Not on file  Stress: Not on file  Social Connections: Not on file   Allergies  Allergen Reactions  . Tape Hives and Other (See Comments)    redness   Family History  Problem Relation Age of Onset  . Breast cancer Sister   . Breast cancer Maternal Grandmother   . Breast cancer Paternal Grandmother   . Heart disease Paternal Grandfather   . Stroke Other        Grandfather  . Coronary artery disease Neg Hx        Premature     Current Outpatient Medications (Cardiovascular):  .  diltiazem (CARDIZEM CD) 300 MG 24 hr capsule, TAKE 1 CAPSULE(300  MG) BY MOUTH DAILY .  propranolol (INDERAL) 20 MG tablet, TAKE 1/2 TABLET BY MOUTH THREE TIMES DAILY AS NEEDED FOR PALPITATIONS .  propranolol (INDERAL) 20 MG tablet, TAKE 1/2 TABLET BY MOUTH THREE TIMES DAILY AS NEEDED FOR PALPITATIONS   Current Outpatient Medications (Analgesics):  .  aspirin 81 MG chewable tablet, Chew 81 mg by mouth daily.   Current Outpatient Medications (Other):  Marland Kitchen  Ascorbic Acid (VITAMIN C) 1000 MG tablet, Take 1,000 mg by mouth daily. .  Multiple Vitamin (MULTIVITAMIN) tablet, Take 1 tablet by mouth daily. .  Magnesium 250 MG TABS, Take 25 mg by mouth daily. Marland Kitchen  omeprazole (PRILOSEC) 20 MG capsule, Take 20 mg by mouth daily.    Reviewed prior external information including notes and imaging from  primary care provider As well as notes that were available from care everywhere and other healthcare systems.  Past medical history, social, surgical and family history all reviewed in electronic medical record.  Bradley pertanent information unless stated regarding to the chief complaint.   Review of Systems:  Bradley headache, visual changes, nausea, vomiting, diarrhea, constipation, dizziness, abdominal pain, skin rash, fevers, chills, night sweats, weight loss, swollen lymph nodes, body aches, joint swelling, chest pain, shortness of breath, mood changes.   Objective  Blood pressure (!) 146/82, pulse 68, height 5\' 4"  (1.626 m), weight 172 lb (78 kg), SpO2 98 %.   General: Bradley apparent distress alert and oriented x3 mood and affect normal, dressed appropriately.  HEENT: Pupils equal, extraocular movements intact  Respiratory: Patient's speak in full sentences and does not appear short of breath  Cardiovascular: Bradley lower extremity edema, non tender, Bradley erythema  Gait normal with good balance and coordination.  MSK: Deferred exam today.    Impression and Recommendations:     The above documentation has been reviewed and is accurate and complete Lyndal Pulley, DO

## 2020-07-30 ENCOUNTER — Encounter: Payer: Self-pay | Admitting: Family Medicine

## 2020-07-30 ENCOUNTER — Ambulatory Visit: Payer: BC Managed Care – PPO | Admitting: Family Medicine

## 2020-07-30 ENCOUNTER — Other Ambulatory Visit: Payer: Self-pay

## 2020-07-30 VITALS — BP 146/82 | HR 68 | Ht 64.0 in | Wt 172.0 lb

## 2020-07-30 DIAGNOSIS — M75112 Incomplete rotator cuff tear or rupture of left shoulder, not specified as traumatic: Secondary | ICD-10-CM

## 2020-07-30 DIAGNOSIS — R030 Elevated blood-pressure reading, without diagnosis of hypertension: Secondary | ICD-10-CM | POA: Diagnosis not present

## 2020-07-30 DIAGNOSIS — M2550 Pain in unspecified joint: Secondary | ICD-10-CM

## 2020-07-30 LAB — CBC WITH DIFFERENTIAL/PLATELET
Basophils Absolute: 0 10*3/uL (ref 0.0–0.1)
Basophils Relative: 0.6 % (ref 0.0–3.0)
Eosinophils Absolute: 0.1 10*3/uL (ref 0.0–0.7)
Eosinophils Relative: 0.9 % (ref 0.0–5.0)
HCT: 36.1 % (ref 36.0–46.0)
Hemoglobin: 12.4 g/dL (ref 12.0–15.0)
Lymphocytes Relative: 36.7 % (ref 12.0–46.0)
Lymphs Abs: 2.1 10*3/uL (ref 0.7–4.0)
MCHC: 34.2 g/dL (ref 30.0–36.0)
MCV: 93.3 fl (ref 78.0–100.0)
Monocytes Absolute: 0.5 10*3/uL (ref 0.1–1.0)
Monocytes Relative: 8.9 % (ref 3.0–12.0)
Neutro Abs: 3.1 10*3/uL (ref 1.4–7.7)
Neutrophils Relative %: 52.9 % (ref 43.0–77.0)
Platelets: 269 10*3/uL (ref 150.0–400.0)
RBC: 3.87 Mil/uL (ref 3.87–5.11)
RDW: 13.5 % (ref 11.5–15.5)
WBC: 5.8 10*3/uL (ref 4.0–10.5)

## 2020-07-30 LAB — COMPREHENSIVE METABOLIC PANEL
ALT: 17 U/L (ref 0–35)
AST: 20 U/L (ref 0–37)
Albumin: 4.3 g/dL (ref 3.5–5.2)
Alkaline Phosphatase: 71 U/L (ref 39–117)
BUN: 9 mg/dL (ref 6–23)
CO2: 30 mEq/L (ref 19–32)
Calcium: 9.2 mg/dL (ref 8.4–10.5)
Chloride: 105 mEq/L (ref 96–112)
Creatinine, Ser: 0.66 mg/dL (ref 0.40–1.20)
GFR: 97.75 mL/min (ref >60.00)
Glucose, Bld: 97 mg/dL (ref 70–99)
Potassium: 4 mEq/L (ref 3.5–5.1)
Sodium: 142 mEq/L (ref 135–145)
Total Bilirubin: 0.3 mg/dL (ref 0.2–1.2)
Total Protein: 6.5 g/dL (ref 6.0–8.3)

## 2020-07-30 LAB — VITAMIN D 25 HYDROXY (VIT D DEFICIENCY, FRACTURES): VITD: 56.8 ng/mL (ref 30.00–100.00)

## 2020-07-30 NOTE — Assessment & Plan Note (Signed)
Had elevation again today.  Rechecked blood pressure and was 140/82.  Patient is following up closely with her primary care provider.  We will get laboratory work-up in case necessary before surgery.  Patient has had low potassium as well and will recheck.

## 2020-07-30 NOTE — Assessment & Plan Note (Signed)
Patient has a large rotator cuff tear.  Patient is doing significantly better for quite some time but is now having the retraction.  We discussed the possibility of different things such as PRP but with the retraction I think that surgical intervention would probably be the most beneficial.  Patient will be scheduled for surgery in the near future.  Follow-up with me as needed

## 2020-07-30 NOTE — Patient Instructions (Addendum)
Good to see you I think you are in great hands with Dr. Tamera Punt [facility] today Contact me if you have any questions

## 2020-07-31 ENCOUNTER — Telehealth: Payer: Self-pay | Admitting: Cardiovascular Disease

## 2020-07-31 NOTE — Telephone Encounter (Signed)
   [facility] Pre-operative Risk Assessment    Patient Name: Alexis Bradley  DOB: June 25, 1965  MRN: [id number]   HEARTCARE STAFF: - Please ensure there is not already an duplicate clearance open for this procedure. - Under Visit Info/Reason for Call, type in Other and utilize the format Clearance MM/DD/YY or Clearance TBD. Do not use dashes or single digits. - If request is for dental extraction, please clarify the # of teeth to be extracted.  Request for surgical clearance:  1. What type of surgery is being performed? Left shoulder arthroscopy rotator cuff repair, subacromial decompression  2. When is this surgery scheduled? TBD   3. What type of clearance is required (medical clearance vs. Pharmacy clearance to hold med vs. Both)? both  4. Are there any medications that need to be held prior to surgery and how long? Not listed, please advise   5. Practice name and name of physician performing surgery? [facility] - Dr Tania Ade   6. What is the office phone number? [phone]   7.   What is the office fax number? [phone]  8.   Anesthesia type (None, local, MAC, general) ? Choice    Ace Gins 07/31/2020, 11:24 AM  _________________________________________________________________   (provider comments below)

## 2020-07-31 NOTE — Telephone Encounter (Signed)
   Patient Name: Alexis Bradley  DOB: 1965-09-11  MRN: [id number]   Primary Cardiologist: Kathlyn Sacramento, MD  Chart reviewed as part of pre-operative protocol coverage. Patient has not been seen within the last 6 months (has been almost 1 year). Because of Alexis Bradley's past medical history and time since last visit, she will require a follow-up visit in order to better assess preoperative cardiovascular risk. She has an appointment with Dr. Fletcher Anon scheduled for 09/17/2020. If procedure will be before then, we can try to move this appointment up.  Pre-op covering staff: - Please schedule appointment and call patient to inform them. If patient already had an upcoming appointment within acceptable timeframe, please add "pre-op clearance" to the appointment notes so provider is aware. - Please contact requesting surgeon's office via preferred method (i.e, phone, fax) to inform them of need for appointment prior to surgery.  If applicable, this message will also be routed to pharmacy pool and/or primary cardiologist for input on holding anticoagulant/antiplatelet agent as requested below so that this information is available to the clearing provider at time of patient's appointment.   Darreld Mclean, PA-C  07/31/2020, 12:04 PM

## 2020-08-05 NOTE — Telephone Encounter (Signed)
Spoke with pt and she states that her surgery is on 08/13/20. I have schedule pt an appointment with Laurann Montana, PA-C on 4/14 at 2 pm. Pt verbalized understanding and thanked me for the call.  Will fax over recommendations to requesting surgeon's office.

## 2020-08-05 NOTE — Telephone Encounter (Signed)
Addressed see 07/31/20 encounter for documentation.

## 2020-08-05 NOTE — Telephone Encounter (Signed)
Alexis Bradley is calling to check the status of surgical clearance. Please call to advise.

## 2020-08-08 ENCOUNTER — Other Ambulatory Visit: Payer: Self-pay

## 2020-08-08 ENCOUNTER — Ambulatory Visit: Payer: BC Managed Care – PPO | Admitting: Family

## 2020-08-08 ENCOUNTER — Encounter: Payer: Self-pay | Admitting: Family

## 2020-08-08 VITALS — BP 128/78 | HR 62 | Ht 64.0 in | Wt 168.0 lb

## 2020-08-08 DIAGNOSIS — Z01810 Encounter for preprocedural cardiovascular examination: Secondary | ICD-10-CM | POA: Diagnosis not present

## 2020-08-08 DIAGNOSIS — I359 Nonrheumatic aortic valve disorder, unspecified: Secondary | ICD-10-CM | POA: Diagnosis not present

## 2020-08-08 DIAGNOSIS — I471 Supraventricular tachycardia: Secondary | ICD-10-CM

## 2020-08-08 MED ORDER — PROPRANOLOL HCL 20 MG PO TABS: ORAL_TABLET | ORAL | 3 refills | Status: DC

## 2020-08-08 NOTE — Patient Instructions (Signed)
Medication Instructions:  Continue your current medications.   *If you need a refill on your cardiac medications before your next appointment, please call your pharmacy*   Lab Work: None ordered today.  Testing/Procedures: Your EKG today showed normal sinus rhythm.   Your physician has requested that you have an echocardiogram August 2022. Echocardiography is a painless test that uses sound waves to create images of your heart. It provides your doctor with information about the size and shape of your heart and how well your heart's chambers and valves are working. This procedure takes approximately one hour. There are no restrictions for this procedure.  Follow-Up: At [facility], you and your health needs are our priority.  As part of our continuing mission to provide you with exceptional heart care, we have created designated Provider Care Teams.  These Care Teams include your primary Cardiologist (physician) and Advanced Practice Providers (APPs -  Physician Assistants and Nurse Practitioners) who all work together to provide you with the care you need, when you need it.  We recommend signing up for the patient portal called "MyChart".  Sign up information is provided on this After Visit Summary.  MyChart is used to connect with patients for Virtual Visits (Telemedicine).  Patients are able to view lab/test results, encounter notes, upcoming appointments, etc.  Non-urgent messages can be sent to your provider as well.   To learn more about what you can do with MyChart, go to [email or web address].    Your next appointment:   6 month(s)  The format for your next appointment:   In Person  Provider:   You may see Kathlyn Sacramento, MD or one of the following Advanced Practice Providers on your designated Care Team:    Murray Hodgkins, NP  Christell Faith, PA-C  Marrianne Mood, PA-C  Cadence Kathlen Mody, [place]  Laurann Montana, NP  Other Instructions  To prevent or reduce lower  extremity swelling: . Eat a low salt diet. Salt makes the body hold onto extra fluid which causes swelling. . Sit with legs elevated. For example, in the recliner or on an [place].  . Wear knee-high compression stockings during the daytime. Ones labeled 15-20 mmHg provide good compression.  Heart Healthy Diet Recommendations: A low-salt diet is recommended. Meats should be grilled, baked, or boiled. Avoid fried foods. Focus on lean protein sources like fish or chicken with vegetables and fruits. The American Heart Association is a Microbiologist!  American Heart Association Diet and Lifeystyle Recommendations   Exercise recommendations: The American Heart Association recommends 150 minutes of moderate intensity exercise weekly. Try 30 minutes of moderate intensity exercise 4-5 times per week. This could include walking, jogging, or swimming.

## 2020-08-08 NOTE — Progress Notes (Signed)
Office Visit    Patient Name: Alexis Bradley Date of Encounter: 08/08/2020  PCP:  Dereck Ligas, [facility]  Cardiologist:  Kathlyn Sacramento, MD  Advanced Practice Provider:  No care team member to display Electrophysiologist:  None   Chief Complaint    Alexis Bradley is a 55 y.o. female with a hx of paroxysmal SVT, PVC, palpitations, brief atrial fibrillation on previous monitoring not on anticoagulation, PE in the setting of DVT related to ankle fracture, aortic insufficiency, GERD presents today for preop cardiovascular examination  Past Medical History    Past Medical History:  Diagnosis Date  . Aortic insufficiency    a. 05/2010 Echo: EF 65-70%; mild to moderate AI (trileaflet AoV), mild MR, Asc Ao dilated to 3.7 cm; b. 01/2015 Echo: EF 60-65%, no rwma, mild to mod AI, nl Ao root, mild MR, PASP 91mmHg.  . Fatigue   . Gastric polyps March 2012   Endoscopy  . GERD (gastroesophageal reflux disease) Feb 2011   esophagitis by EGD Feb 2011  . Hiatal hernia   . Paroxysmal atrial tachycardia ([place])    a. Holter 2009 with PVCs and bigeminy. b. Holter 1/12 with PVCs and short runs of atrial tachycardia (up to 9 beats). c. Holter 12/2014: rare PACs, rare short runs of narrow complex tachycardia c/w atrial tachycardia; d. 06/2016 Event monitor: runs of SVT.  Marland Kitchen PAT (paroxysmal atrial tachycardia) ([place])   . Poor circulation of extremity    Bil legs  . Premature atrial contractions   . PVC's (premature ventricular contractions)   . S/P left heart catheterization by percutaneous approach 2009   a. cath 11/2007: minor luminal irregularities, EF 60%.  . Sinus bradycardia    a. HR occasionally 40s per patient, rare dips to 30s.  . Varicose veins    Past Surgical History:  Procedure Laterality Date  . ABDOMINAL HYSTERECTOMY    . CARDIAC CATHETERIZATION  2009   Left heart cath  . CESAREAN SECTION    . CHOLECYSTECTOMY    . Right GSV ELAS with 10-20 stab  phlebectomies right calf  05-25-11  . VESICOVAGINAL FISTULA CLOSURE W/ TAH      Allergies  Allergies  Allergen Reactions  . Tape Hives and Other (See Comments)    redness    History of Present Illness    Alexis Bradley is a 55 y.o. female with a hx of paroxysmal SVT, PVC, palpitations, brief atrial fibrillation on previous monitoring not on anticoagulation, PE in the setting of DVT related to ankle fracture, aortic insufficiency, GERD  last seen 09/14/2019.  Cardiac catheterization in 2009 with no significant coronary disease and normal LVEF.  Previous monitoring has showed brief episodes of atrial fibrillation as well as SVT.  Previously intolerant to metoprolol with fatigue and bradycardia.  Tolerated transition to diltiazem without difficulty.  Worsening palpitations September 2019 with documented brief atrial fibrillation lasting only a few minutes.  Labs are unremarkable.  Echo with normal LVEF, mild to moderate AI, mild MR, borderline pulmonary hypertension.  14-day ZIO monitor which showed normal sinus rhythm with frequent episodes of SVT longest 13 seconds and no evidence of atrial fibrillation.  She had a pulmonary embolism in 2020 in the setting of DVT related to ankle fracture and was treated with 6 months of anticoagulation.  Echo 11/2018 with normal LVEF, normal diastolic function, no RWMA, mild dilation of ascending aorta 39mm, mild MR, mild to moderate AI. ZIO monitor 07/2019 with  predominantly NSR, 11 runs of SVT, and no PAF.  She has upcoming left shoulder arthroscopy rotator cuff repair and subacromial decompression with Dr. Tamera Punt upcoming 08/13/20.  She presents today for cardiovascular clearance.  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  She does not desire primary care earlier this week.  Elevated D-dimer on Tuesday and subsequent CT chest and bilateral lower extremity duplex with no evidence of PE or DVT.  She endorses eating a heart healthy diet.  She enjoys  walking for exercise and walked a 4 mile trail this weekend with her sister.  She reports no chest pain, pressure, tightness.  No shortness of breath at rest or dyspnea on exertion.  She will have occasional palpitations which are well controlled on her PRN propranolol.  No lightheadedness, dizziness, near-syncope, syncope.  EKGs/Labs/Other Studies Reviewed:   The following studies were reviewed today: Echo 11/2018  1. The left ventricle has normal systolic function with an ejection  fraction of 60-65%. The cavity size was normal. Left ventricular diastolic  parameters were normal. No evidence of left ventricular regional wall  motion abnormalities.   2. The right ventricle has normal systolic function. The cavity was  normal. There is no increase in right ventricular wall thickness. Right  ventricular systolic pressure is normal with an estimated pressure of 29.2  mmHg.   3. Left atrial size was mildly dilated.   4. The aortic valve is tricuspid. Aortic valve regurgitation is mild to  moderate by color flow Doppler.   5. The aorta is abnormal unless otherwise noted.   6. There is mild dilatation of the ascending aorta measuring 36 mm.   7. The interatrial septum was not well visualized.   Monitor 07/2019 Normal sinus rhythm with an average heart rate of 65 bpm. 11 runs of supraventricular tachycardia.  The longest lasted 16 beats with an average heart rate of 124 bpm. Rare PACs and rare PVCs. Most triggered events did not correlate with arrhythmia.  However, some correlated with SVT and premature beats.  EKG:  EKG is ordered today.  The ekg ordered today demonstrates NSR 62 bpm with no acute ST/T wave changes.  Recent Labs: 08/11/2019: Magnesium 2.1; TSH 3.510 07/30/2020: ALT 17; BUN 9; Creatinine, Ser 0.66; Hemoglobin 12.4; Platelets 269.0; Potassium 4.0; Sodium 142  Recent Lipid Panel    Component Value Date/Time   CHOL 231 (H) 10/08/2015 1519   TRIG 112.0 10/08/2015 1519   HDL  106.90 10/08/2015 1519   CHOLHDL 2 10/08/2015 1519   VLDL 22.4 10/08/2015 1519   LDLCALC 101 (H) 10/08/2015 1519   Home Medications   Current Meds  Medication Sig  . aspirin 81 MG chewable tablet Chew 81 mg by mouth daily.  . Cholecalciferol 25 MCG (1000 UT) capsule Take by mouth.  . diltiazem (CARDIZEM CD) 300 MG 24 hr capsule TAKE 1 CAPSULE(300 MG) BY MOUTH DAILY  . Multiple Vitamin (MULTIVITAMIN) tablet Take 1 tablet by mouth daily.  Marland Kitchen omeprazole (PRILOSEC) 20 MG capsule Take 20 mg by mouth daily.   . [DISCONTINUED] propranolol (INDERAL) 20 MG tablet TAKE 1/2 TABLET BY MOUTH THREE TIMES DAILY AS NEEDED FOR PALPITATIONS     Review of Systems  All other systems reviewed and are otherwise negative except as noted above.  Physical Exam    VS:  BP 128/78 (BP Location: Right Arm, Patient Position: Sitting, Cuff Size: Normal)   Pulse 62   Ht 5\' 4"  (1.626 m)   Wt 168 lb (76.2  kg)   SpO2 98%   BMI 28.84 kg/m  , BMI Body mass index is 28.84 kg/m.  Wt Readings from Last 3 Encounters:  08/08/20 168 lb (76.2 kg)  07/30/20 172 lb (78 kg)  06/11/20 162 lb (73.5 kg)    GEN: Well nourished, well developed, in no acute distress. HEENT: normal. Neck: Supple, no JVD, carotid bruits, or masses. Cardiac: RRR, no murmurs, rubs, or gallops. No clubbing, cyanosis, edema.  Radials/DP/PT 2+ and equal bilaterally.  Respiratory:  Respirations regular and unlabored, clear to auscultation bilaterally. GI: Soft, nontender, nondistended. MS: No deformity or atrophy. Skin: Warm and dry, no rash.  Lower extremities with bilateral varicose veins. Neuro:  Strength and sensation are intact. Psych: Normal affect.  Assessment & Plan    1. Preoperative cardiovascular examination-upcoming left shoulder rotator cuff repair and subacromial decompression.According to the Revised Cardiac Risk Index (RCRI), her Perioperative Risk of Major Cardiac Event is (%): 0.4. Her Functional Capacity in METs is: 8.33  according to the [place] Activity Status Index (DASI).  Per AHA/ACC guidelines she is deemed acceptable risk for the planned procedure and may proceed without additional cardiovascular testing.  Will route to requesting party via Epic fax function. As she has no history of PAD/CAD she may hold Aspirin 5 days prior to the procedure if deemed necessary by the surgeon.   2. Paroxysmal SVT-previous documented atrial fibrillation with overall low burden.  Not maintained on anticoagulation per primary cardiologist.  Continue to 300 mg diltiazem daily and as needed propanolol for breakthrough palpitations.  3. Aortic valve disease-Echo 11/2018 normal LVEF, mild to moderate AI, ascending aorta borderline dilated 36 mmHg.  Plan to update echocardiogram in August for monitoring.  This does not need to be completed before her surgery.  Disposition: Follow up in 6 month(s) with Dr. Fletcher Anon or APP  Signed, Loel Dubonnet, NP 08/08/2020, 2:35 PM [place]

## 2020-09-17 ENCOUNTER — Ambulatory Visit: Payer: BC Managed Care – PPO | Admitting: Cardiovascular Disease

## 2020-11-10 ENCOUNTER — Other Ambulatory Visit: Payer: Self-pay | Admitting: Cardiovascular Disease

## 2020-11-29 ENCOUNTER — Ambulatory Visit (INDEPENDENT_AMBULATORY_CARE_PROVIDER_SITE_OTHER): Payer: BC Managed Care – PPO

## 2020-11-29 ENCOUNTER — Other Ambulatory Visit: Payer: Self-pay

## 2020-11-29 DIAGNOSIS — I359 Nonrheumatic aortic valve disorder, unspecified: Secondary | ICD-10-CM | POA: Diagnosis not present

## 2020-11-29 LAB — ECHOCARDIOGRAM COMPLETE
AR max vel: 2.12 cm2
AV Area VTI: 2.51 cm2
AV Area mean vel: 2.36 cm2
AV Mean grad: 7 mmHg
AV Peak grad: 13.7 mmHg
Ao pk vel: 1.85 m/s
Area-P 1/2: 3.7 cm2
Calc EF: 57.6 %
P 1/2 time: 472 msec
S' Lateral: 2.6 cm
Single Plane A2C EF: 57 %
Single Plane A4C EF: 55.6 %

## 2021-02-25 ENCOUNTER — Other Ambulatory Visit: Payer: Self-pay | Admitting: Cardiovascular Disease

## 2021-05-22 ENCOUNTER — Other Ambulatory Visit: Payer: Self-pay | Admitting: Cardiovascular Disease

## 2021-06-23 ENCOUNTER — Encounter: Payer: Self-pay | Admitting: Gastroenterology

## 2021-07-21 ENCOUNTER — Other Ambulatory Visit: Payer: Self-pay | Admitting: Cardiovascular Disease

## 2021-07-21 NOTE — Progress Notes (Signed)
? ?Cardiology Office Note   ? ?Date:  07/25/2021  ? ?ID:  AVRIL Bradley, DOB Nov 10, 1965, MRN [id number] ? ?PCP:  Dereck Ligas, DO  ?Cardiologist:  Kathlyn Sacramento, MD  ?Electrophysiologist:  None  ? ?Chief Complaint: Follow-up ? ?History of Present Illness:  ? ?Alexis Bradley is a 56 y.o. female with history of paroxysmal SVT, PVC, palpitations, brief A-fib on previous monitoring not on [facility], PE in the setting of DVT related to ankle fracture, aortic insufficiency, Covid in 11/2020, and GERD who presents for follow-up of SVT. ? ?She was previously evaluated for PAT in 2009 at which time a Holter monitor showed runs of PVCs and SVT. She was treated with metoprolol initially, though she was unable to tolerate this due to bradycardia and fatigue. She was changed to diltiazem which was successful in controlling her heart rate. She underwent a cardiac cath in 2009 that showed minor luminal irregularities and an EF of 60%. Echo in 2016 showed an EF of 60-65%, no RWMA, mild to moderate AI, normal aortic root, mild MR, PASP 35 mmHg.  She had worsening palpitations in 12/2017 with a documented brief episode of A-fib lasting a few minutes.  Her labs were unremarkable.  Echo showed normal LV systolic function, mild to moderate aortic insufficiency, mild mitral regurgitation, and borderline pulmonary hypertension.  14-day Zio patch showed normal sinus rhythm with recurrent episodes of SVT with the longest episode lasting 13 seconds.  There was no evidence of A-fib.  She was diagnosed with a PE in 2020 in the setting of DVT related to ankle fracture.  She was treated with 6 months of anticoagulation.  Due to increased fatigue and palpitations, she underwent repeat outpatient cardiac monitoring in 07/2019 which demonstrated a predominant rhythm of sinus with 11 runs of SVT with the longest episode lasting 16 beats.  She was last seen in the office in 07/2020 for preoperative cardiac restratification for upcoming left shoulder  arthroscopy, rotator cuff repair, and subacromial decompression.  It was noted at that time she had recently had an elevated D-dimer with subsequent CTA of the chest and bilateral lower extremity venous duplex showing no evidence of PE or DVT respectively.  She subsequently underwent repeat echo in 11/2020, to evaluate her aortic valve disease, which demonstrated an EF of 60 to 65%, no regional wall motion abnormalities, normal LV diastolic function parameters, normal RV systolic function and ventricular cavity size, mildly elevated PASP estimated at 39.8 mmHg, mildly dilated left atrium, mild mitral regurgitation, and mild to moderate aortic insufficiency.  Overall, findings were stable.  She was seen in the ED in 04/2021 with left calf pain with lower extremity venous duplex showing no evidence of DVT. ? ?She comes in doing well from a cardiac perspective.  Over the past 4 to 5 months she has needed to take a as needed half propanolol 3-4 nights per week.  She notes episodes of tachypalpitations that will wake her from sleep and last approximately 15 to 20 minutes after taking a as needed propanolol.  Symptoms feel similar to her prior palpitations.  Tolerating Cardizem CD.  No angina, dyspnea, dizziness, presyncope, or syncope.  No significant lower extremity swelling or orthopnea.  She has had 1 mechanical fall since she was last seen, tripping over the curb being of a sidewalk.  She did not hit her head or suffer LOC.  She has not been as active with her walking regimen since she was last seen.  Otherwise, she does not  have any issues or concerns at this time. ? ? ?Labs independently reviewed: ?01/2021 - TC 267, TG 61, HDL 94, LDL 161, A1c 5.9 ?11/2020 - potassium 4.1, BUN 7, serum creatinine 0.7, TSH normal ?07/2020 - Hgb 12.4, PLT 269, albumin 4.3, AST/ALT normal ?06/2020 - magnesium 1.9 ? ?Past Medical History:  ?Diagnosis Date  ? Aortic insufficiency   ? a. 05/2010 Echo: EF 65-70%; mild to moderate AI (trileaflet  AoV), mild MR, Asc Ao dilated to 3.7 cm; b. 01/2015 Echo: EF 60-65%, no rwma, mild to mod AI, nl Ao root, mild MR, PASP 50mHg.  ? Fatigue   ? Gastric polyps March 2012  ? Endoscopy  ? GERD (gastroesophageal reflux disease) Feb 2011  ? esophagitis by EGD Feb 2011  ? Hiatal hernia   ? Paroxysmal atrial tachycardia ([place]   ? a. Holter 2009 with PVCs and bigeminy. b. Holter 1/12 with PVCs and short runs of atrial tachycardia (up to 9 beats). c. Holter 12/2014: rare PACs, rare short runs of narrow complex tachycardia c/w atrial tachycardia; d. 06/2016 Event monitor: runs of SVT.  ? PAT [place]oxysmal [place]cardia) [place]   ? Poor circulation of extremity   ? Bil legs  ? Premature atrial contractions   ? PVC's (premature ventricular contractions)   ? S/P left heart catheterization by percutaneous approach 2009  ? a. cath 11/2007: minor luminal irregularities, EF 60%.  ? Sinus bradycardia   ? a. HR occasionally 40s per patient, rare dips to 30s.  ? Varicose veins   ? ? ?Past Surgical History:  ?Procedure Laterality Date  ? ABDOMINAL HYSTERECTOMY    ? CARDIAC CATHETERIZATION  2009  ? Left heart cath  ? CESAREAN SECTION    ? CHOLECYSTECTOMY    ? Right GSV ELAS with 10-20 stab phlebectomies right calf  05-25-11  ? VESICOVAGINAL FISTULA CLOSURE W/ TAH    ? ? ?Current Medications: ?Current Meds  ?Medication Sig  ? aspirin 81 MG chewable tablet Chew 81 mg by mouth daily.  ? Cholecalciferol 25 MCG (1000 UT) capsule Take by mouth.  ? diltiazem (CARDIZEM CD) 180 MG 24 hr capsule Take 1 capsule (180 mg total) by mouth daily.  ? Multiple Vitamin (MULTIVITAMIN) tablet Take 1 tablet by mouth daily.  ? omeprazole (PRILOSEC) 20 MG capsule Take 20 mg by mouth daily.   ? propranolol (INDERAL) 20 MG tablet TAKE 1/2 TABLET BY MOUTH THREE TIMES DAILY AS NEEDED FOR PALPITATIONS  ? [DISCONTINUED] diltiazem (CARDIZEM CD) 300 MG 24 hr capsule TAKE 1 CAPSULE(300 MG) BY MOUTH DAILY  ? ? ?Allergies:   Tape  ? ?Social History  ? ?Socioeconomic  History  ? Marital status: Married  ?  Spouse name: Not on file  ? Number of children: Not on file  ? Years of education: Not on file  ? Highest education level: Not on file  ?Occupational History  ? Occupation: MEducational psychologistat [employer] ?  Employer: OTHER  ?  Comment: Also works in teh administration  ?Tobacco Use  ? Smoking status: Never  ? Smokeless tobacco: Never  ?Vaping Use  ? Vaping Use: Never used  ?Substance and Sexual Activity  ? Alcohol use: Yes  ?  Alcohol/week: 3.0 standard drinks  ?  Types: 3 Standard drinks or equivalent per week  ?  Comment: Rarely  ? Drug use: No  ? Sexual activity: Not on file  ?Other Topics Concern  ? Not on file  ?Social History Narrative  ? Married  ? ?  Social Determinants of Health  ? ?Financial Resource Strain: Not on file  ?Food Insecurity: Not on file  ?Transportation Needs: Not on file  ?Physical Activity: Not on file  ?Stress: Not on file  ?Social Connections: Not on file  ?  ? ?Family History:  ?The patient's family history includes Breast cancer in her maternal grandmother, paternal grandmother, and sister; Heart disease in her paternal grandfather; Stroke in an other family member. There is no history of Coronary artery disease. ? ?ROS:   ?Review of Systems  ?Constitutional:  Negative for diaphoresis, malaise/fatigue and weight loss.  ?HENT:  Negative for congestion.   ?Eyes:  Negative for discharge and redness.  ?Respiratory:  Negative for cough, shortness of breath and wheezing.   ?Cardiovascular:  Positive for palpitations. Negative for chest pain, orthopnea, claudication, leg swelling and PND.  ?Musculoskeletal:  Positive for falls.  ?Skin:  Negative for rash.  ?Neurological:  Negative for dizziness, tremors, sensory change, speech change, focal weakness, loss of consciousness and weakness.  ?Psychiatric/Behavioral:  Negative for substance abuse. The patient is not nervous/anxious.   ?All other systems reviewed and are  negative. ? ? ?EKGs/Labs/Other Studies Reviewed:   ? ?Studies reviewed were summarized above. The additional studies were reviewed today: ? ?2D echo 11/29/2020: ?1. Left ventricular ejection fraction, by estimation, is 60 to 65%. The  ?left ven

## 2021-07-25 ENCOUNTER — Encounter: Payer: Self-pay | Admitting: Physician Assistant

## 2021-07-25 ENCOUNTER — Ambulatory Visit: Payer: BC Managed Care – PPO | Admitting: Physician Assistant

## 2021-07-25 VITALS — BP 138/78 | HR 61 | Ht 64.0 in | Wt 178.1 lb

## 2021-07-25 DIAGNOSIS — I471 Supraventricular tachycardia: Secondary | ICD-10-CM | POA: Diagnosis not present

## 2021-07-25 DIAGNOSIS — I359 Nonrheumatic aortic valve disorder, unspecified: Secondary | ICD-10-CM | POA: Diagnosis not present

## 2021-07-25 MED ORDER — DILTIAZEM HCL ER COATED BEADS 180 MG PO CP24: 180.0000 mg | ORAL_CAPSULE | Freq: Every day | ORAL | 1 refills | Status: DC

## 2021-07-25 NOTE — Patient Instructions (Signed)
Medication Instructions:  ?Your physician has recommended you make the following change in your medication:  ? ?STOP Cardizem 300 mg daily ? ?START Cardizem 180 mg twice a day. An Rx has been sent to your pharmacy ? ?*If you need a refill on your cardiac medications before your next appointment, please call your pharmacy* ? ? ?Lab Work: ?None ordered ?If you have labs (blood work) drawn today and your tests are completely normal, you will receive your results only by: ?MyChart Message (if you have MyChart) OR ?A paper copy in the mail ?If you have any lab test that is abnormal or we need to change your treatment, we will call you to review the results. ? ? ?Testing/Procedures: ?None ordered ? ? ?Follow-Up: ?At [facility], you and your health needs are our priority.  As part of our continuing mission to provide you with exceptional heart care, we have created designated Provider Care Teams.  These Care Teams include your primary Cardiologist (physician) and Advanced Practice Providers (APPs -  Physician Assistants and Nurse Practitioners) who all work together to provide you with the care you need, when you need it. ? ?We recommend signing up for the patient portal called "MyChart".  Sign up information is provided on this After Visit Summary.  MyChart is used to connect with patients for Virtual Visits (Telemedicine).  Patients are able to view lab/test results, encounter notes, upcoming appointments, etc.  Non-urgent messages can be sent to your provider as well.   ?To learn more about what you can do with MyChart, go to [email or web address].   ? ?Your next appointment:   ?4 month(s) ? ?The format for your next appointment:   ?In Person ? ?Provider:   ?You may see Kathlyn Sacramento, MD or one of the following Advanced Practice Providers on your designated Care Team:   ?Murray Hodgkins, NP ?Christell Faith, PA-C ?Cadence Kathlen Mody, PA-C ? ? ?Other Instructions ?N/A ? ?

## 2021-07-28 ENCOUNTER — Other Ambulatory Visit: Payer: Self-pay

## 2021-07-28 ENCOUNTER — Ambulatory Visit (AMBULATORY_SURGERY_CENTER): Payer: BC Managed Care – PPO | Admitting: *Deleted

## 2021-07-28 ENCOUNTER — Encounter: Payer: Self-pay | Admitting: Gastroenterology

## 2021-07-28 VITALS — Ht 64.0 in | Wt 174.0 lb

## 2021-07-28 DIAGNOSIS — Z1211 Encounter for screening for malignant neoplasm of colon: Secondary | ICD-10-CM

## 2021-07-28 MED ORDER — DILTIAZEM HCL ER COATED BEADS 180 MG PO CP24
180.0000 mg | ORAL_CAPSULE | Freq: Every day | ORAL | 0 refills | Status: DC
Start: 2021-07-28 — End: 2021-07-29

## 2021-07-28 NOTE — Progress Notes (Signed)
No egg or soy allergy known to patient  ?No issues known to pt with past sedation with any surgeries or procedures ?Patient denies ever being told they had issues or difficulty with intubation  ?No FH of Malignant Hyperthermia ?Pt is not on diet pills ?Pt is not on  home 02  ?Pt is not on blood thinners  ?Pt denies issues with constipation  ?No A fib or A flutter ? ?Due to the COVID-19 pandemic we are asking patients to follow certain guidelines in PV and the [place]   ?Pt aware of COVID protocols and [facility] guidelines  ? ?PV completed over the phone. Pt verified name, DOB, address and insurance during PV today.  ?Pt mailed instruction packet with copy of consent form to read and not return, and instructions.  ?Pt encouraged to call with questions or issues.  ?If pt has My chart, procedure instructions sent via My Chart   ? ?Pt.had a history of a fib 4-5 years ago,have converted back to normal rhythm. ?

## 2021-07-29 ENCOUNTER — Telehealth: Payer: Self-pay | Admitting: Physician Assistant

## 2021-07-29 ENCOUNTER — Other Ambulatory Visit: Payer: Self-pay | Admitting: *Deleted

## 2021-07-29 MED ORDER — DILTIAZEM HCL ER COATED BEADS 180 MG PO CP24: 180.0000 mg | ORAL_CAPSULE | Freq: Two times a day (BID) | ORAL | 3 refills | Status: DC

## 2021-07-29 MED ORDER — DILTIAZEM HCL ER COATED BEADS 180 MG PO CP24: 180.0000 mg | ORAL_CAPSULE | Freq: Two times a day (BID) | ORAL | 0 refills | Status: DC

## 2021-07-29 NOTE — Telephone Encounter (Signed)
?*  STAT* If patient is at the pharmacy, call can be transferred to refill team. ? ? ?1. Which medications need to be refilled? (please list name of each medication and dose if known) diltiazem (CARDIZEM CD) 180 MG 24 hr capsule ? ? ?2. Which pharmacy/location (including street and city if local pharmacy) is medication to be sent to? ? ?3. Do they need a 30 day or 90 day supply? 90 with refills ? ?Patient is out of medication. Patient said the dosage instructions were to take one tablet daily but in the office Ryan told her to take two tablets daily.  ? ? ?

## 2021-07-29 NOTE — Telephone Encounter (Signed)
Requested Prescriptions  ? ?Signed Prescriptions Disp Refills  ? diltiazem (CARDIZEM CD) 180 MG 24 hr capsule 180 capsule 0  ?  Sig: Take 1 capsule (180 mg total) by mouth 2 (two) times daily.  ?  Authorizing Provider: Rise Mu  ?  Ordering User: Othelia Pulling C  ? ? ?

## 2021-08-11 ENCOUNTER — Telehealth: Payer: Self-pay | Admitting: Cardiovascular Disease

## 2021-08-11 NOTE — Telephone Encounter (Signed)
Patient c/o Palpitations:  High priority if patient c/o lightheadedness, shortness of breath, or chest pain ? ?How long have you had palpitations/irregular HR/ Afib? Are you having the symptoms now? noticed the past 2 days, yes ? ?Are you currently experiencing lightheadedness, SOB or CP? A little dizziness, not now ? ?Do you have a history of afib (atrial fibrillation) or irregular heart rhythm? yes ? ?Have you checked your BP or HR? (document readings if available): 147/73 this morning ? ?Are you experiencing any other symptoms? No ? ? ?Patient states she has noticed her BP being high and some skipping heart beats. She says she has gotten some dizziness, but does not have it now.  ? ?

## 2021-08-11 NOTE — Telephone Encounter (Signed)
Spoke with the patient. ?Patient was seen by Christell Faith, PA on 07/25/21 and her Diltiazem ER was changed to 180 mg bid. Patient sts that she has not noticed ant change in the frequency of her palpitations. ?Patient also reports elevated BP readings which is new for her. ?Recommended that we schedule an appt for her to be seen. ?Patient is agreeable. ? ?Appt scheduled with Dr. Fletcher Anon on 08/12/21 @ 4:20pm. ? ? ?

## 2021-08-12 ENCOUNTER — Encounter: Payer: Self-pay | Admitting: Cardiovascular Disease

## 2021-08-12 ENCOUNTER — Ambulatory Visit: Payer: BC Managed Care – PPO | Admitting: Cardiovascular Disease

## 2021-08-12 VITALS — BP 120/70 | HR 64 | Ht 64.0 in | Wt 176.2 lb

## 2021-08-12 DIAGNOSIS — I359 Nonrheumatic aortic valve disorder, unspecified: Secondary | ICD-10-CM | POA: Diagnosis not present

## 2021-08-12 DIAGNOSIS — I10 Essential (primary) hypertension: Secondary | ICD-10-CM

## 2021-08-12 DIAGNOSIS — I471 Supraventricular tachycardia: Secondary | ICD-10-CM

## 2021-08-12 NOTE — Progress Notes (Signed)
?  ?Cardiology Office Note ? ? ?Date:  08/12/2021  ? ?ID:  Alexis Bradley, DOB 26-Dec-1965, MRN [id number] ? ?PCP:  Dereck Ligas, DO  ?Cardiologist:   Kathlyn Sacramento, MD  ? ?Chief Complaint  ?Patient presents with  ? Other  ?  Elevated BP and Palpitations. Meds reviewed verbally with pt.  ? ? ?  ?History of Present Illness: ?Alexis Bradley is a 56 y.o. female who presents for a follow-up visit regarding palpitations due to paroxysmal SVT and PVCs.  She also had documented episodes of brief atrial fibrillation. ?She had cardiac catheterization 2009 which showed no significant coronary artery disease normal ejection fraction. ?She was treated in the past with metoprolol but she developed fatigue and bradycardia. She was switched to diltiazem with subsequent improvement. ? ?She had worsening palpitations in September 2019 with a documented brief atrial fibrillation lasting few minutes.  Her labs were unremarkable.  Echocardiogram showed normal LV systolic function, mild to moderate aortic insufficiency, mild mitral regurgitation, and borderline pulmonary hypertension.  14 days ZIO monitor showed sinus rhythm with recurrent episodes of SVT the longest lasted 13 seconds.  There was no evidence of atrial fibrillation. ?She had pulmonary embolism in 2020 in the setting of DVT related to ankle fracture.  She was treated with 6 months of anticoagulation. ? ?Most recent echocardiogram in August 2022 showed normal LV systolic function with mild mitral regurgitation and stable mild to moderate aortic regurgitation.  She was most recently seen in March for increased palpitations.  Diltiazem was switched to twice daily.  She reports no significant change in symptoms.  She does admit to significant stress at work which might be contributing.  In addition, she had some episodes of elevated blood pressure.  No chest pain. ? ? ? ?Past Medical History:  ?Diagnosis Date  ? Aortic insufficiency   ? a. 05/2010 Echo: EF 65-70%; mild to  moderate AI (trileaflet AoV), mild MR, Asc Ao dilated to 3.7 cm; b. 01/2015 Echo: EF 60-65%, no rwma, mild to mod AI, nl Ao root, mild MR, PASP 66mHg.  ? [place]rial [place] ([facility]   ? 4-5 YEARS AGO UPDATED 07/28/21  ? Cancer ([facility]   ? SKIN  ? Fatigue   ? Gastric polyps 06/2010  ? Endoscopy  ? GERD (gastroesophageal reflux disease) 05/2009  ? esophagitis by EGD Feb 2011  ? Heart murmur   ? Hiatal hernia   ? Paroxysmal atrial tachycardia ([facility]   ? a. Holter 2009 with PVCs and bigeminy. b. Holter 1/12 with PVCs and short runs of atrial tachycardia (up to 9 beats). c. Holter 12/2014: rare PACs, rare short runs of narrow complex tachycardia c/w atrial tachycardia; d. 06/2016 Event monitor: runs of SVT.  ? PAT (paroxysmal atrial tachycardia) ([facility]   ? Poor circulation of extremity   ? Bil legs  ? Premature atrial contractions   ? PVC's (premature ventricular contractions)   ? S/P left heart catheterization by percutaneous approach 2009  ? a. cath 11/2007: minor luminal irregularities, EF 60%.  ? Sinus bradycardia   ? a. HR occasionally 40s per patient, rare dips to 30s.  ? Varicose veins   ? ? ?Past Surgical History:  ?Procedure Laterality Date  ? ABDOMINAL HYSTERECTOMY    ? CARDIAC CATHETERIZATION  04/28/2007  ? Left heart cath  ? CESAREAN SECTION    ? CHOLECYSTECTOMY    ? Right GSV ELAS with 10-20 stab phlebectomies right calf  05/25/2011  ? ROTATOR CUFF REPAIR Left   ?  VESICOVAGINAL FISTULA CLOSURE W/ TAH    ? ? ? ?Current Outpatient Medications  ?Medication Sig Dispense Refill  ? aspirin 81 MG chewable tablet Chew 81 mg by mouth daily.    ? Cholecalciferol 25 MCG (1000 UT) capsule Take by mouth.    ? diltiazem (CARDIZEM CD) 180 MG 24 hr capsule Take 1 capsule (180 mg total) by mouth 2 (two) times daily. 180 capsule 3  ? Multiple Vitamin (MULTIVITAMIN) tablet Take 1 tablet by mouth daily.    ? omeprazole (PRILOSEC) 20 MG capsule Take 20 mg by mouth daily.     ? omeprazole (PRILOSEC) 20 MG capsule TAKE 1 CAPSULE(20 MG) BY  MOUTH EVERY DAY    ? propranolol (INDERAL) 20 MG tablet TAKE 1/2 TABLET BY MOUTH THREE TIMES DAILY AS NEEDED FOR PALPITATIONS 30 tablet 3  ? ?No current facility-administered medications for this visit.  ? ? ?Allergies:   Tape  ? ? ?Social History:  The patient  reports that she has never smoked. She has been exposed to tobacco smoke. She has never used smokeless tobacco. She reports current alcohol use of about 3.0 standard drinks per week. She reports that she does not use drugs.  ? ?Family History:  The patient's family history includes Breast cancer in her maternal grandmother, paternal grandmother, and sister; Colon polyps in her paternal aunt; Heart disease in her paternal grandfather; Stroke in an other family member.  ? ? ?ROS:  Please see the history of present illness.   Otherwise, review of systems are positive for none.   All other systems are reviewed and negative.  ? ? ?PHYSICAL EXAM: ?VS:  BP 120/70 (BP Location: Left Arm, Patient Position: Sitting, Cuff Size: Normal)   Pulse 64   Ht '5\' 4"'$  (1.626 m)   Wt 176 lb 4 oz (79.9 kg)   SpO2 98%   BMI 30.25 kg/m?  , BMI Body mass index is 30.25 kg/m?. ?GEN: Well nourished, well developed, in no acute distress  ?HEENT: normal  ?Neck: no JVD, carotid bruits, or masses ?Cardiac: RRR; no rubs, or gallops,no edema . 2/6 systolic ejection murmur in the aortic area. ?Respiratory:  clear to auscultation bilaterally, normal work of breathing ?GI: soft, nontender, nondistended, + BS ?MS: no deformity or atrophy  ?Skin: warm and dry, no rash ?Neuro:  Strength and sensation are intact ?Psych: euthymic mood, full affect ? ? ?EKG:  EKG is ordered today. ?The ekg ordered today demonstrates normal sinus rhythm with minimal LVH. ? ? ?Recent Labs: ?No results found for requested labs within last 8760 hours.  ? ? ?Lipid Panel ?   ?Component Value Date/Time  ? CHOL 231 (H) 10/08/2015 1519  ? TRIG 112.0 10/08/2015 1519  ? HDL 106.90 10/08/2015 1519  ? CHOLHDL 2 10/08/2015  1519  ? VLDL 22.4 10/08/2015 1519  ? LDLCALC 101 (H) 10/08/2015 1519  ? ?  ? ?Wt Readings from Last 3 Encounters:  ?08/12/21 176 lb 4 oz (79.9 kg)  ?07/28/21 174 lb (78.9 kg)  ?07/25/21 178 lb 2 oz (80.8 kg)  ?  ? ? ? ?   ? View : No data to display.  ?  ?  ?  ? ? ? ? ?ASSESSMENT AND PLAN: ? ? ?1. Paroxysmal SVT: She also had documented atrial fibrillation with overall low burden.  Recent palpitations in the setting of increased stress and anxiety.  No evidence of arrhythmia by today's EKG or exam.  Given that her episodes are not very frequent, I  advised her to obtain an Apple Watch or the [place] device.  She can then monitor for any recurrent arrhythmia. ? ?2.  Aortic valve disease: Most recent echocardiogram showed stable mild to moderate aortic regurgitation. ? ?3.  Essential hypertension: Blood pressure is controlled today.  She reports recent elevation in blood pressure.  I asked her to monitor blood pressure daily over the next week and send [place] the readings.  If blood pressure is persistently high, we could consider adding an ARB. ? ? ?Disposition:   FU with me in 6 months ? ?Signed, ? ?Kathlyn Sacramento, MD  ?08/12/2021 4:27 PM    ?[place] ?

## 2021-08-12 NOTE — Patient Instructions (Signed)
Medication Instructions:  ?Your physician recommends that you continue on your current medications as directed. Please refer to the Current Medication list given to you today. ? ?*If you need a refill on your cardiac medications before your next appointment, please call your pharmacy* ? ? ?Lab Work: ?None ordered ?If you have labs (blood work) drawn today and your tests are completely normal, you will receive your results only by: ?MyChart Message (if you have MyChart) OR ?A paper copy in the mail ?If you have any lab test that is abnormal or we need to change your treatment, we will call you to review the results. ? ? ?Testing/Procedures: ?None ordered ? ? ?Follow-Up: ?At [facility], you and your health needs are our priority.  As part of our continuing mission to provide you with exceptional heart care, we have created designated Provider Care Teams.  These Care Teams include your primary Cardiologist (physician) and Advanced Practice Providers (APPs -  Physician Assistants and Nurse Practitioners) who all work together to provide you with the care you need, when you need it. ? ?We recommend signing up for the patient portal called "MyChart".  Sign up information is provided on this After Visit Summary.  MyChart is used to connect with patients for Virtual Visits (Telemedicine).  Patients are able to view lab/test results, encounter notes, upcoming appointments, etc.  Non-urgent messages can be sent to your provider as well.   ?To learn more about what you can do with MyChart, go to [email or web address].   ? ?Your next appointment:   ?Your physician wants you to follow-up in: 6 months You will receive a reminder letter in the mail two months in advance. If you don't receive a letter, please call our office to schedule the follow-up appointment. ? ? ?The format for your next appointment:   ?In Person ? ?Provider:   ?You may see Kathlyn Sacramento, MD or one of the following Advanced Practice Providers on your  designated Care Team:   ?Murray Hodgkins, NP ?Christell Faith, PA-C ?Cadence Kathlen Mody, PA-C{ ? ? ?Other Instructions ?Your physician has requested that you  monitor and record your blood pressure readings daily at home for 1 week. Please use the same machine at the same time of day to check your readings and record them. Please call the office or send a mychart message to provide [place] the readings. ? ? ?Important Information About Sugar ? ? ? ? ? ? ?

## 2021-08-16 ENCOUNTER — Other Ambulatory Visit: Payer: Self-pay | Admitting: Family

## 2021-08-18 ENCOUNTER — Encounter: Payer: Self-pay | Admitting: Gastroenterology

## 2021-08-18 ENCOUNTER — Ambulatory Visit (AMBULATORY_SURGERY_CENTER): Payer: BC Managed Care – PPO | Admitting: Gastroenterology

## 2021-08-18 VITALS — BP 137/62 | HR 47 | Temp 96.9°F | Resp 14 | Ht 64.0 in | Wt 174.0 lb

## 2021-08-18 DIAGNOSIS — Z1211 Encounter for screening for malignant neoplasm of colon: Secondary | ICD-10-CM

## 2021-08-18 DIAGNOSIS — D128 Benign neoplasm of rectum: Secondary | ICD-10-CM | POA: Diagnosis not present

## 2021-08-18 DIAGNOSIS — D122 Benign neoplasm of ascending colon: Secondary | ICD-10-CM

## 2021-08-18 MED ORDER — SODIUM CHLORIDE 0.9 % IV SOLN: 500.0000 mL | Freq: Once | INTRAVENOUS | Status: DC

## 2021-08-18 NOTE — Telephone Encounter (Signed)
Rx(s) sent to pharmacy electronically.  

## 2021-08-18 NOTE — Progress Notes (Signed)
To Pacu, VSS. Report to [email or web address] 

## 2021-08-18 NOTE — Op Note (Signed)
[place] ?Patient Name: Alexis Bradley ?Procedure Date: 08/18/2021 10:05 AM ?MRN: [id number] ?Endoscopist: Estill Cotta. Loletha Carrow , MD ?Age: 56 ?Referring MD:  ?Date of Birth: Feb 07, 1966 ?Gender: Female ?Account #: [id number] ?Procedure:                Colonoscopy ?Indications:              Screening for colorectal malignant neoplasm ?                          last colonoscopy Feb 2013 (no adenomatous or  ?                          serrated polyps) ?Medicines:                Monitored Anesthesia Care ?Procedure:                Pre-Anesthesia Assessment: ?                          - Prior to the procedure, a History and Physical  ?                          was performed, and patient medications and  ?                          allergies were reviewed. The patient's tolerance of  ?                          previous anesthesia was also reviewed. The risks  ?                          and benefits of the procedure and the sedation  ?                          options and risks were discussed with the patient.  ?                          All questions were answered, and informed consent  ?                          was obtained. Prior Anticoagulants: The patient has  ?                          taken no previous anticoagulant or antiplatelet  ?                          agents. ASA Grade Assessment: II - A patient with  ?                          mild systemic disease. After reviewing the risks  ?                          and benefits, the patient was deemed in  ?  satisfactory condition to undergo the procedure. ?                          After obtaining informed consent, the colonoscope  ?                          was passed under direct vision. Throughout the  ?                          procedure, the patient's blood pressure, pulse, and  ?                          oxygen saturations were monitored continuously. The  ?                          Olympus CF-HQ190L (#[id number]) Colonoscope was  ?                           introduced through the anus and advanced to the the  ?                          cecum, identified by appendiceal orifice and  ?                          ileocecal valve. The colonoscopy was performed  ?                          without difficulty. The patient tolerated the  ?                          procedure well. The quality of the bowel  ?                          preparation was excellent. The ileocecal valve,  ?                          appendiceal orifice, and rectum were photographed. ?Scope In: 10:24:44 AM ?Scope Out: 10:38:48 AM ?Scope Withdrawal Time: 0 hours 10 minutes 29 seconds  ?Total Procedure Duration: 0 hours 14 minutes 4 seconds  ?Findings:                 The perianal and digital rectal examinations were  ?                          normal. ?                          Repeat examination of right colon under NBI  ?                          performed. ?                          Two sessile polyps were found in the rectum and  ?  ascending colon. The polyps were diminutive in  ?                          size. These polyps were removed with a cold snare.  ?                          Resection and retrieval were complete. ?                          Internal hemorrhoids were found. The hemorrhoids  ?                          were small. ?                          The exam was otherwise without abnormality on  ?                          direct and retroflexion views. ?Complications:            No immediate complications. ?Estimated Blood Loss:     Estimated blood loss was minimal. ?Impression:               - Two diminutive polyps in the rectum and in the  ?                          ascending colon, removed with a cold snare.  ?                          Resected and retrieved. ?                          - Internal hemorrhoids. ?                          - The examination was otherwise normal on direct  ?                          and retroflexion  views. ?Recommendation:           - Patient has a contact number available for  ?                          emergencies. The signs and symptoms of potential  ?                          delayed complications were discussed with the  ?                          patient. Return to normal activities tomorrow.  ?                          Written discharge instructions were provided to the  ?                          patient. ?                          -  Resume previous diet. ?                          - Continue present medications. ?                          - Await pathology results. ?                          - Repeat colonoscopy is recommended for  ?                          surveillance. The colonoscopy date will be  ?                          determined after pathology results from today's  ?                          exam become available for review. ?Logan Vegh L. Loletha Carrow, MD ?08/18/2021 10:45:03 AM ?This report has been signed electronically. ?

## 2021-08-18 NOTE — Progress Notes (Signed)
History and Physical: ? This patient presents for endoscopic testing for: ?Encounter Diagnosis  ?Name Primary?  ? Special screening for malignant neoplasms, colon Yes  ? ? ?No TA or SSP polyps last colonoscopy 05/2011 ?Patient denies chronic abdominal pain, rectal bleeding, constipation or diarrhea. ? ? ?ROS: ?Patient denies chest pain or shortness of breath ? ? ?Past Medical History: ?Past Medical History:  ?Diagnosis Date  ? Aortic insufficiency   ? a. 05/2010 Echo: EF 65-70%; mild to moderate AI (trileaflet AoV), mild MR, Asc Ao dilated to 3.7 cm; b. 01/2015 Echo: EF 60-65%, no rwma, mild to mod AI, nl Ao root, mild MR, PASP 3mHg.  ? [place] ([facility]   ? 4-5 YEARS AGO UPDATED 07/28/21  ? Cancer ([facility]   ? SKIN  ? Fatigue   ? Gastric polyps 06/2010  ? Endoscopy  ? GERD (gastroesophageal reflux disease) 05/2009  ? esophagitis by EGD Feb 2011  ? Heart murmur   ? Hiatal hernia   ? Paroxysmal atrial tachycardia ([facility]   ? a. Holter 2009 with PVCs and bigeminy. b. Holter 1/12 with PVCs and short runs of atrial tachycardia (up to 9 beats). c. Holter 12/2014: rare PACs, rare short runs of narrow complex tachycardia c/w atrial tachycardia; d. 06/2016 Event monitor: runs of SVT.  ? PAT (paroxysmal atrial tachycardia) ([facility]   ? Poor circulation of extremity   ? Bil legs  ? Premature atrial contractions   ? PVC's (premature ventricular contractions)   ? S/P left heart catheterization by percutaneous approach 2009  ? a. cath 11/2007: minor luminal irregularities, EF 60%.  ? Sinus bradycardia   ? a. HR occasionally 40s per patient, rare dips to 30s.  ? Varicose veins   ? ? ? ?Past Surgical History: ?Past Surgical History:  ?Procedure Laterality Date  ? ABDOMINAL HYSTERECTOMY    ? CARDIAC CATHETERIZATION  04/28/2007  ? Left heart cath  ? CESAREAN SECTION    ? CHOLECYSTECTOMY    ? Right GSV ELAS with 10-20 stab phlebectomies right calf  05/25/2011  ? ROTATOR CUFF REPAIR Left   ? VESICOVAGINAL FISTULA CLOSURE W/ TAH     ? ? ?Allergies: ?Allergies  ?Allergen Reactions  ? Tape Hives and Other (See Comments)  ?  redness  ? ? ?Outpatient Meds: ?Current Outpatient Medications  ?Medication Sig Dispense Refill  ? aspirin 81 MG chewable tablet Chew 81 mg by mouth daily.    ? diltiazem (CARDIZEM CD) 180 MG 24 hr capsule Take 1 capsule (180 mg total) by mouth 2 (two) times daily. 180 capsule 3  ? Multiple Vitamin (MULTIVITAMIN) tablet Take 1 tablet by mouth daily.    ? omeprazole (PRILOSEC) 20 MG capsule TAKE 1 CAPSULE(20 MG) BY MOUTH EVERY DAY    ? Cholecalciferol 25 MCG (1000 UT) capsule Take by mouth.    ? omeprazole (PRILOSEC) 20 MG capsule Take 20 mg by mouth daily.     ? propranolol (INDERAL) 20 MG tablet TAKE 1/2 TABLET BY MOUTH THREE TIMES DAILY AS NEEDED FOR PALPITATIONS 45 tablet 3  ? ?Current Facility-Administered Medications  ?Medication Dose Route Frequency Provider Last Rate Last Admin  ? 0.9 %  sodium chloride infusion  500 mL Intravenous Once DDoran Stabler MD      ? ? ? ? ?___________________________________________________________________ ?Objective  ? ?Exam: ? ?BP (!) 163/73   Pulse (!) 56   Temp (!) 96.9 ?F (36.1 ?C) (Temporal)   Resp 12   Ht '5\' 4"'$  (1.626 m)  Wt 174 lb (78.9 kg)   SpO2 99%   BMI 29.87 kg/m?  ? ?CV: RRR without murmur, S1/S2 ?Resp: clear to auscultation bilaterally, normal RR and effort noted ?GI: soft, no tenderness, with active bowel sounds. ? ? ?Assessment: ?Encounter Diagnosis  ?Name Primary?  ? Special screening for malignant neoplasms, colon Yes  ? ? ? ?Plan: ?Colonoscopy ? The benefits and risks of the planned procedure were described in detail with the patient or (when appropriate) their health care proxy.  Risks were outlined as including, but not limited to, bleeding, infection, perforation, adverse medication reaction leading to cardiac or pulmonary decompensation, pancreatitis (if ERCP).  The limitation of incomplete mucosal visualization was also discussed.  No guarantees or  warranties were given. ? ? ? ?The patient is appropriate for an endoscopic procedure in the ambulatory setting. ? ? - Wilfrid Lund, MD ? ? ? ? ?

## 2021-08-18 NOTE — Progress Notes (Signed)
Pt's states no medical or surgical changes since previsit or office visit. 

## 2021-08-18 NOTE — Progress Notes (Signed)
Called to room to assist during endoscopic procedure.  Patient ID and intended procedure confirmed with present staff. Received instructions for my participation in the procedure from the performing physician.  

## 2021-08-18 NOTE — Patient Instructions (Signed)
Handouts Provided:  Polyps  YOU HAD AN ENDOSCOPIC PROCEDURE TODAY AT THE [facility]:   Refer to the procedure report that was given to you for any specific questions about what was found during the examination.  If the procedure report does not answer your questions, please call your gastroenterologist to clarify.  If you requested that your care partner not be given the details of your procedure findings, then the procedure report has been included in a sealed envelope for you to review at your convenience later.  YOU SHOULD EXPECT: Some feelings of bloating in the abdomen. Passage of more gas than usual.  Walking can help get rid of the air that was put into your GI tract during the procedure and reduce the bloating. If you had a lower endoscopy (such as a colonoscopy or flexible sigmoidoscopy) you may notice spotting of blood in your stool or on the toilet paper. If you underwent a bowel prep for your procedure, you may not have a normal bowel movement for a few days.  Please Note:  You might notice some irritation and congestion in your nose or some drainage.  This is from the oxygen used during your procedure.  There is no need for concern and it should clear up in a day or so.  SYMPTOMS TO REPORT IMMEDIATELY:   Following lower endoscopy (colonoscopy or flexible sigmoidoscopy):  Excessive amounts of blood in the stool  Significant tenderness or worsening of abdominal pains  Swelling of the abdomen that is new, acute  Fever of 100F or higher  For urgent or emergent issues, a gastroenterologist can be reached at any hour by calling [phone]. Do not use MyChart messaging for urgent concerns.    DIET:  We do recommend a small meal at first, but then you may proceed to your regular diet.  Drink plenty of fluids but you should avoid alcoholic beverages for 24 hours.  ACTIVITY:  You should plan to take it easy for the rest of today and you should NOT DRIVE or use heavy  machinery until tomorrow (because of the sedation medicines used during the test).    FOLLOW UP: Our staff will call the number listed on your records 48-72 hours following your procedure to check on you and address any questions or concerns that you may have regarding the information given to you following your procedure. If we do not reach you, we will leave a message.  We will attempt to reach you two times.  During this call, we will ask if you have developed any symptoms of COVID 19. If you develop any symptoms (ie: fever, flu-like symptoms, shortness of breath, cough etc.) before then, please call [phone].  If you test positive for Covid 19 in the 2 weeks post procedure, please call and report this information to us.    If any biopsies were taken you will be contacted by phone or by letter within the next 1-3 weeks.  Please call us at [phone] if you have not heard about the biopsies in 3 weeks.    SIGNATURES/CONFIDENTIALITY: You and/or your care partner have signed paperwork which will be entered into your electronic medical record.  These signatures attest to the fact that that the information above on your After Visit Summary has been reviewed and is understood.  Full responsibility of the confidentiality of this discharge information lies with you and/or your care-partner.  

## 2021-08-20 ENCOUNTER — Telehealth: Payer: Self-pay

## 2021-08-20 NOTE — Telephone Encounter (Signed)
?  Follow up Call- ? ? ?  08/18/2021  ?  9:44 AM  ?Call back number  ?Post procedure Call Back phone  # [phone]  ?Permission to leave phone message Yes  ?  ? ?Patient questions: ? ?Do you have a fever, pain , or abdominal swelling? No. ?Pain Score  0 * ? ?Have you tolerated food without any problems? Yes.   ? ?Have you been able to return to your normal activities? Yes.   ? ?Do you have any questions about your discharge instructions: ?Diet   No. ?Medications  No. ?Follow up visit  No. ? ?Do you have questions or concerns about your Care? No. ? ?Actions: ?* If pain score is 4 or above: ?No action needed, pain <4. ? ? ?

## 2021-08-22 ENCOUNTER — Encounter: Payer: Self-pay | Admitting: Gastroenterology

## 2021-09-10 ENCOUNTER — Encounter: Payer: Self-pay | Admitting: Cardiovascular Disease

## 2021-11-07 ENCOUNTER — Encounter: Payer: Self-pay | Admitting: Cardiovascular Disease

## 2021-11-07 ENCOUNTER — Telehealth: Payer: Self-pay | Admitting: Cardiovascular Disease

## 2021-11-07 NOTE — Telephone Encounter (Signed)
I reviewed the tracings and they are highly suggestive of atrial fibrillation.  Please schedule an office follow-up visit to discuss anticoagulation.

## 2021-11-07 NOTE — Telephone Encounter (Signed)
Patient c/o Palpitations:  High priority if patient c/o lightheadedness, shortness of breath, or chest pain  How long have you had palpitations/irregular HR/ Afib? Are you having the symptoms now? Yes, pt feeling "weird"  Are you currently experiencing lightheadedness, SOB or CP? No   Do you have a history of afib (atrial fibrillation) or irregular heart rhythm? Yes   Have you checked your BP or HR? (document readings if available): HR 126  Are you experiencing any other symptoms? No   Pt said, Dr. Fletcher Anon told her to get an apple watch. Today it says she's in Afib. She said the only symptoms she's having is she feels "weird". She denied dizziness, CP and SOB

## 2021-11-07 NOTE — Telephone Encounter (Signed)
Spoke with the patient. Patient has a hx of PSVT. Dr. Fletcher Anon recommended that she purchase a apple watch to keep track of her heart rate and rhythm.  Pt sts that her apple watch this afternoon showed that her HR was in  the 120's and captured her rhythm as AFIB.  Adv the patient that the apple watch will sometimes inaccurately list an arrhythmia as AFIB. I walked the patient through uploading her apple watch EKG to her mychart. Adv the patient that I will fwd the msg to Dr. Fletcher Anon to review. Patients is prescribed propanolol 10 mg tid prn for palpitations. Pt sts that she did take 10 mg 5 mons prior to our call. Adv the patient to recheck her HR in the next 1-2 hours if her HR is still >100 bpm she should take an additional 10 mg of her propanolol. Reminded the pt that she can take 10 mg or propanolol tid prn.  Pt sts that she is scheduled to fly domestically on Sunday. Adv the patient that if her elevated HR persist over the weekend she can call the on call provider over the weekend for further recommendation until Dr. Fletcher Anon has the opportunity to review and give his recommendation.

## 2021-11-11 NOTE — Telephone Encounter (Signed)
Patient made aware of Dr. Tyrell Antonio response and recommendation. Appt scheduled on 11/20/21 @ 10am with Dr. Fletcher Anon.

## 2021-11-20 ENCOUNTER — Ambulatory Visit: Payer: BC Managed Care – PPO | Admitting: Cardiovascular Disease

## 2021-11-20 ENCOUNTER — Other Ambulatory Visit
Admission: RE | Admit: 2021-11-20 | Discharge: 2021-11-20 | Disposition: A | Discharge status: Home / Self Care | Payer: BC Managed Care – PPO | Source: Ambulatory Visit | Attending: Cardiovascular Disease | Admitting: Cardiovascular Disease

## 2021-11-20 ENCOUNTER — Encounter: Payer: Self-pay | Admitting: Cardiovascular Disease

## 2021-11-20 VITALS — BP 138/70 | HR 50 | Ht 64.0 in | Wt 176.0 lb

## 2021-11-20 DIAGNOSIS — I359 Nonrheumatic aortic valve disorder, unspecified: Secondary | ICD-10-CM

## 2021-11-20 DIAGNOSIS — I480 Paroxysmal atrial fibrillation: Secondary | ICD-10-CM | POA: Diagnosis present

## 2021-11-20 DIAGNOSIS — I10 Essential (primary) hypertension: Secondary | ICD-10-CM | POA: Diagnosis not present

## 2021-11-20 LAB — BASIC METABOLIC PANEL
Anion gap: 8 (ref 5–15)
BUN: 12 mg/dL (ref 6–20)
CO2: 27 mmol/L (ref 22–32)
Calcium: 9.3 mg/dL (ref 8.9–10.3)
Chloride: 106 mmol/L (ref 98–111)
Creatinine, Ser: 0.67 mg/dL (ref 0.44–1.00)
GFR, Estimated: >60 mL/min (ref >60)
Glucose, Bld: 103 mg/dL — ABNORMAL HIGH (ref 70–99)
Potassium: 4.3 mmol/L (ref 3.5–5.1)
Sodium: 141 mmol/L (ref 135–145)

## 2021-11-20 LAB — CBC WITH DIFFERENTIAL/PLATELET
Abs Immature Granulocytes: 0.02 10*3/uL (ref 0.00–0.07)
Basophils Absolute: 0.1 10*3/uL (ref 0.0–0.1)
Basophils Relative: 1 %
Eosinophils Absolute: 0.1 10*3/uL (ref 0.0–0.5)
Eosinophils Relative: 2 %
HCT: 42 % (ref 36.0–46.0)
Hemoglobin: 14 g/dL (ref 12.0–15.0)
Immature Granulocytes: 0 %
Lymphocytes Relative: 33 %
Lymphs Abs: 2 10*3/uL (ref 0.7–4.0)
MCH: 31.3 pg (ref 26.0–34.0)
MCHC: 33.3 g/dL (ref 30.0–36.0)
MCV: 93.8 fL (ref 80.0–100.0)
Monocytes Absolute: 0.5 10*3/uL (ref 0.1–1.0)
Monocytes Relative: 7 %
Neutro Abs: 3.5 10*3/uL (ref 1.7–7.7)
Neutrophils Relative %: 57 %
Platelets: 329 10*3/uL (ref 150–400)
RBC: 4.48 MIL/uL (ref 3.87–5.11)
RDW: 12.8 % (ref 11.5–15.5)
WBC: 6.2 10*3/uL (ref 4.0–10.5)
nRBC: 0 % (ref 0.0–0.2)

## 2021-11-20 LAB — TSH: TSH: 3.324 u[IU]/mL (ref 0.350–4.500)

## 2021-11-20 MED ORDER — APIXABAN 5 MG PO TABS: 5.0000 mg | ORAL_TABLET | Freq: Two times a day (BID) | ORAL | 1 refills | Status: DC

## 2021-11-20 NOTE — Patient Instructions (Signed)
Medication Instructions:  Your physician has recommended you make the following change in your medication:   STOP Aspirin  START Eliquis 5 mg twice a day. An Rx has been sent to your pharmacy.  *If you need a refill on your cardiac medications before your next appointment, please call your pharmacy*   Lab Work: Bmp, Cbc, Tsh today  Please have your labs drawn at the [facility]. Stop at the Registration desk to check in  If you have labs (blood work) drawn today and your tests are completely normal, you will receive your results only by: [place] (if you have MyChart) OR A paper copy in the mail If you have any lab test that is abnormal or we need to change your treatment, we will call you to review the results.   Testing/Procedures: None ordered   Follow-Up: At [facility], you and your health needs are our priority.  As part of our continuing mission to provide you with exceptional heart care, we have created designated Provider Care Teams.  These Care Teams include your primary Cardiologist (physician) and Advanced Practice Providers (APPs -  Physician Assistants and Nurse Practitioners) who all work together to provide you with the care you need, when you need it.  We recommend signing up for the patient portal called "MyChart".  Sign up information is provided on this After Visit Summary.  MyChart is used to connect with patients for Virtual Visits (Telemedicine).  Patients are able to view lab/test results, encounter notes, upcoming appointments, etc.  Non-urgent messages can be sent to your provider as well.   To learn more about what you can do with MyChart, go to [email or web address].    Your next appointment:   Your physician wants you to follow-up in: 6 months You will receive a reminder letter in the mail two months in advance. If you don't receive a letter, please call our office to schedule the follow-up appointment.   The format for your next  appointment:   In Person  Provider:   You may see Kathlyn Sacramento, MD or one of the following Advanced Practice Providers on your designated Care Team:   Murray Hodgkins, NP Christell Faith, PA-C Cadence Kathlen Mody, PA-C{    Other Instructions N/A  Important Information About Sugar

## 2021-11-20 NOTE — Progress Notes (Signed)
Cardiology Office Note   Date:  11/20/2021   ID:  LARUEN RISSER, DOB [birth year], MRN [id number]  PCP:  Dereck Ligas, DO  Cardiologist:   Kathlyn Sacramento, MD   Chief Complaint  Patient presents with   Other    Afib episode. Meds reviewed verbally with pt.      History of Present Illness: Alexis Bradley is a 56 y.o. female who presents for a follow-up visit regarding palpitations due to paroxysmal SVT, paroxysmal A-fib and PVCs.   She had cardiac catheterization 2009 which showed no significant coronary artery disease normal ejection fraction. She was treated in the past with metoprolol but she developed fatigue and bradycardia. She was switched to diltiazem with subsequent improvement.  She had worsening palpitations in September 2019 with a documented brief atrial fibrillation lasting few minutes.  Her labs were unremarkable.  Echocardiogram showed normal LV systolic function, mild to moderate aortic insufficiency, mild mitral regurgitation, and borderline pulmonary hypertension.  14 days ZIO monitor showed sinus rhythm with recurrent episodes of SVT the longest lasted 13 seconds.  There was no evidence of atrial fibrillation. She had pulmonary embolism in 2020 in the setting of DVT related to ankle fracture.  She was treated with 6 months of anticoagulation.  Most recent echocardiogram in August 2022 showed normal LV systolic function with mild mitral regurgitation and stable mild to moderate aortic regurgitation.  She was most seen in March for increased palpitations.  Diltiazem was switched to twice daily.    She was getting her nails done recently and had a sudden episode of tachycardia.  Her heart rate was 155 bpm.  Symptoms lasted for about 30 minutes.  She had another episode that lasted less.  She now has an Visual merchandiser and has been able to record her heart rhythm.  She sent [place] to recordings and both were highly suggestive of atrial fibrillation with rapid ventricular  response.    Past Medical History:  Diagnosis Date   Aortic insufficiency    a. 05/2010 Echo: EF 65-70%; mild to moderate AI (trileaflet AoV), mild MR, Asc Ao dilated to 3.7 cm; b. 01/2015 Echo: EF 60-65%, no rwma, mild to mod AI, nl Ao root, mild MR, PASP 62mHg.   [place]    4-5 YEARS AGO UPDATED 07/28/21   [place] [place] [facility]    SKIN   Fatigue    Gastric polyps 06/2010   Endoscopy   GERD (gastroesophageal reflux disease) 05/2009   esophagitis by EGD Feb 2011   Heart murmur    Hiatal hernia    Paroxysmal atrial tachycardia ([facility]    a. Holter 2009 with PVCs and bigeminy. b. Holter 1/12 with PVCs and short runs of atrial tachycardia (up to 9 beats). c. Holter 12/2014: rare PACs, rare short runs of narrow complex tachycardia c/w atrial tachycardia; d. 06/2016 Event monitor: runs of SVT.   PAT (paroxysmal atrial tachycardia) (HCC)    Poor circulation of extremity    Bil legs   Premature atrial contractions    PVC's (premature ventricular contractions)    S/P left heart catheterization by percutaneous approach 2009   a. cath 11/2007: minor luminal irregularities, EF 60%.   Sinus bradycardia    a. HR occasionally 40s per patient, rare dips to 30s.   Varicose veins     Past Surgical History:  Procedure Laterality Date   ABDOMINAL HYSTERECTOMY     CARDIAC CATHETERIZATION  04/28/2007   Left heart cath   CESAREAN SECTION  CHOLECYSTECTOMY     Right GSV ELAS with 10-20 stab phlebectomies right calf  05/25/2011   ROTATOR CUFF REPAIR Left    VESICOVAGINAL FISTULA CLOSURE W/ TAH       Current Outpatient Medications  Medication Sig Dispense Refill   aspirin 81 MG chewable tablet Chew 81 mg by mouth daily.     diltiazem (CARDIZEM CD) 180 MG 24 hr capsule Take 1 capsule (180 mg total) by mouth 2 (two) times daily. 180 capsule 3   Multiple Vitamin (MULTIVITAMIN) tablet Take 1 tablet by mouth daily.     omeprazole (PRILOSEC) 20 MG capsule TAKE 1 CAPSULE(20 MG) BY MOUTH EVERY  DAY     propranolol (INDERAL) 20 MG tablet TAKE 1/2 TABLET BY MOUTH THREE TIMES DAILY AS NEEDED FOR PALPITATIONS 45 tablet 3   No current facility-administered medications for this visit.    Allergies:   Tape    Social History:  The patient  reports that she has never smoked. She has been exposed to tobacco smoke. She has never used smokeless tobacco. She reports current alcohol use of about 3.0 standard drinks of alcohol per week. She reports that she does not use drugs.   Family History:  The patient's family history includes Breast cancer in her maternal grandmother, paternal grandmother, and sister; Colon polyps in her paternal aunt; Heart disease in her paternal grandfather; Stroke in an other family member.    ROS:  Please see the history of present illness.   Otherwise, review of systems are positive for none.   All other systems are reviewed and negative.    PHYSICAL EXAM: VS:  BP 138/70 (BP Location: Left Arm, Patient Position: Sitting, Cuff Size: Large)   Pulse (!) 50   Ht '5\' 4"'$  (1.626 m)   Wt 176 lb (79.8 kg)   SpO2 98%   BMI 30.21 kg/m  , BMI Body mass index is 30.21 kg/m. GEN: Well nourished, well developed, in no acute distress  HEENT: normal  Neck: no JVD, carotid bruits, or masses Cardiac: RRR; no rubs, or gallops,no edema . 2/6 systolic ejection murmur in the aortic area. Respiratory:  clear to auscultation bilaterally, normal work of breathing GI: soft, nontender, nondistended, + BS MS: no deformity or atrophy  Skin: warm and dry, no rash Neuro:  Strength and sensation are intact Psych: euthymic mood, full affect   EKG:  EKG is ordered today. The ekg ordered today demonstrates sinus bradycardia with no significant ST or T wave changes.   Recent Labs: No results found for requested labs within last 365 days.    Lipid Panel    Component Value Date/Time   CHOL 231 (H) 10/08/2015 1519   TRIG 112.0 10/08/2015 1519   HDL 106.90 10/08/2015 1519   CHOLHDL  2 10/08/2015 1519   VLDL 22.4 10/08/2015 1519   LDLCALC 101 (H) 10/08/2015 1519      Wt Readings from Last 3 Encounters:  11/20/21 176 lb (79.8 kg)  08/18/21 174 lb (78.9 kg)  08/12/21 176 lb 4 oz (79.9 kg)           No data to display            ASSESSMENT AND PLAN:   1. Paroxysmal SVT/paroxysmal atrial fibrillation: She had few episodes of tachycardia recently with what seems to be A-fib with RVR as documented by her Apple Watch.  These tracings were reviewed by me.  Burden of atrial fibrillation is still not significant enough to justify an antiarrhythmic  medication or consideration of ablation.  However, I do worry about her cardioembolic risk given [id number] score of 2.  Thus, I recommend long-term anticoagulation.  I elected to start Eliquis 5 mg twice daily.  Check routine labs today including CBC, basic metabolic profile and TSH.  2.  Aortic valve disease: Most recent echocardiogram showed stable mild to moderate aortic regurgitation.  3.  Essential hypertension: Blood pressure is reasonably controlled on diltiazem.   Disposition:   FU with me in 6 months  Signed,  Kathlyn Sacramento, MD  11/20/2021 10:10 AM    [place]

## 2021-11-25 ENCOUNTER — Ambulatory Visit: Payer: BC Managed Care – PPO | Admitting: Cardiovascular Disease

## 2022-01-14 ENCOUNTER — Other Ambulatory Visit: Payer: Self-pay | Admitting: Cardiovascular Disease

## 2022-01-14 NOTE — Telephone Encounter (Signed)
Please review

## 2022-01-14 NOTE — Telephone Encounter (Signed)
Prescription refill request for Eliquis received. Indication: PAF Last office visit: 11/20/21  Rod Can MD Scr: 0.67 on 11/20/21 Age:  56 Weight: 79.8kg  Based on above findings Eliquis '5mg'$  twice daily is the appropriate dose.  Refill approved.

## 2022-04-14 ENCOUNTER — Other Ambulatory Visit: Payer: Self-pay | Admitting: Obstetrics and Gynecology

## 2022-04-14 DIAGNOSIS — Z780 Asymptomatic menopausal state: Secondary | ICD-10-CM

## 2022-05-18 NOTE — Progress Notes (Signed)
Cardiology Office Note    Date:  05/19/2022   ID:  Alexis, Bradley 04/06/1966, MRN [id number]  PCP:  Dereck Ligas, DO  Cardiologist:  Kathlyn Sacramento, MD  Electrophysiologist:  None   Chief Complaint: Follow-up  History of Present Illness:   Alexis Bradley is a 57 y.o. female with history of paroxysmal SVT, PAF, PVC, palpitations, PE in the setting of DVT related to ankle fracture, aortic insufficiency, HTN, Covid in 11/2020, and GERD who presents for follow-up of SVT, PAF, and aortic valve disease.   She was previously evaluated for PAT in 2009 at which time a Holter monitor showed runs of PVCs and SVT. She was treated with metoprolol initially, though she was unable to tolerate this due to bradycardia and fatigue. She was changed to diltiazem which was successful in controlling her heart rate. She underwent a cardiac cath in 2009 that showed minor luminal irregularities and an EF of 60%. Echo in 2016 showed an EF of 60-65%, no RWMA, mild to moderate AI, normal aortic root, mild MR, and PASP 35 mmHg.  She had worsening palpitations in 12/2017 with a documented brief episode of A-fib lasting a few minutes.  Her labs were unremarkable.  Echo showed normal LV systolic function, mild to moderate aortic insufficiency, mild mitral regurgitation, and borderline pulmonary hypertension.  14-day Zio patch showed normal sinus rhythm with recurrent episodes of SVT with the longest episode lasting 13 seconds.  There was no evidence of A-fib.  She was diagnosed with a PE in 2020 in the setting of DVT related to ankle fracture.  She was treated with 6 months of anticoagulation.  Due to increased fatigue and palpitations, she underwent repeat outpatient cardiac monitoring in 07/2019 which demonstrated a predominant rhythm of sinus with 11 runs of SVT with the longest episode lasting 16 beats.  She was seen in the office in 07/2020 for preoperative cardiac risk stratification for upcoming left shoulder arthroscopy,  rotator cuff repair, and subacromial decompression.  It was noted at that time she had recently had an elevated D-dimer with subsequent CTA of the chest and bilateral lower extremity venous duplex showing no evidence of PE or DVT respectively.  She subsequently underwent repeat echo in 11/2020, to evaluate her aortic valve disease, which demonstrated an EF of 60 to 65%, no regional wall motion abnormalities, normal LV diastolic function parameters, normal RV systolic function and ventricular cavity size, mildly elevated PASP estimated at 39.8 mmHg, mildly dilated left atrium, mild mitral regurgitation, and mild to moderate aortic insufficiency.  Overall, findings were stable.  She was seen in the ED in 04/2021 with left calf pain with lower extremity venous duplex showing no evidence of DVT.  She was seen in 06/2021 with increased palpitations with diltiazem being titrated to twice daily.  She was last seen in the office in 10/2021 and reported a sudden episode of tachycardia while getting her nails done with a heart rate of 155 bpm.  Symptoms lasted for about 30 minutes.  She had a subsequent episode that lasted for a shorter duration.  These episodes were captured on her Apple Watch which were highly suggestive of atrial fibrillation with RVR as reviewed by her primary cardiologist at visit in 10/2021.  Given these findings, aspirin was discontinued and she was initiated on apixaban.  Her overall burden of A-fib was not significant enough to justify antiarrhythmic medication or consideration of ablation.  Labs obtained at that time were reassuring as outlined below.  She comes in doing well from a cardiac perspective and is without symptoms of angina or decompensation.  No dyspnea, dizziness, presyncope, or syncope.  No falls, hematochezia, or melena.  No lower extremity swelling or progressive orthopnea.  She does continue to note intermittent randomly occurring tachypalpitations that are generally short-lived,  lasting a few seconds with spontaneous resolution.  However, yesterday evening, while cooking dinner, she developed sudden onset of tachypalpitations with heart rates in the 150s bpm.  This episode only lasted a few seconds, though she was quite fatigued thereafterwards.  She reports tachypalpitations will occasionally wake her from sleep and is taking as needed propranolol 3-4 nights per week in this setting.   Labs independently reviewed: 10/2021 - potassium 4.3, BUN 12, serum creatinine 0.67, Hgb 14.0, PLT 329, TSH normal 01/2021 - TC 267, TG 61, HDL 94, LDL 161, A1c 5.9 07/2020 - albumin 4.3, AST/ALT normal  Past Medical History:  Diagnosis Date   Aortic insufficiency    a. 05/2010 Echo: EF 65-70%; mild to moderate AI (trileaflet AoV), mild MR, Asc Ao dilated to 3.7 cm; b. 01/2015 Echo: EF 60-65%, no rwma, mild to mod AI, nl Ao root, mild MR, PASP 41mHg.   [place]    4-5 YEARS AGO UPDATED 07/28/21   [place]    SKIN   Fatigue    Gastric polyps 06/2010   Endoscopy   GERD (gastroesophageal reflux disease) 05/2009   esophagitis by EGD Feb 2011   Heart murmur    Hiatal hernia    Paroxysmal atrial tachycardia    a. Holter 2009 with PVCs and bigeminy. b. Holter 1/12 with PVCs and short runs of atrial tachycardia (up to 9 beats). c. Holter 12/2014: rare PACs, rare short runs of narrow complex tachycardia c/w atrial tachycardia; d. 06/2016 Event monitor: runs of SVT.   PAT (paroxysmal atrial tachycardia)    Poor circulation of extremity    Bil legs   Premature atrial contractions    PVC's (premature ventricular contractions)    S/P left heart catheterization by percutaneous approach 2009   a. cath 11/2007: minor luminal irregularities, EF 60%.   Sinus bradycardia    a. HR occasionally 40s per patient, rare dips to 30s.   Varicose veins     Past Surgical History:  Procedure Laterality Date   ABDOMINAL HYSTERECTOMY     CARDIAC CATHETERIZATION  04/28/2007   Left heart  cath   CESAREAN SECTION     CHOLECYSTECTOMY     Right GSV ELAS with 10-20 stab phlebectomies right calf  05/25/2011   ROTATOR CUFF REPAIR Left    VESICOVAGINAL FISTULA CLOSURE W/ TAH      Current Medications: Current Meds  Medication Sig   apixaban (ELIQUIS) 5 MG TABS tablet TAKE 1 TABLET(5 MG) BY MOUTH TWICE DAILY   diltiazem (CARDIZEM CD) 180 MG 24 hr capsule Take 1 capsule (180 mg total) by mouth 2 (two) times daily.   Multiple Vitamin (MULTIVITAMIN) tablet Take 1 tablet by mouth daily.   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    Comment: Also works in teh administration  Tobacco Use   Smoking status: Never    Passive exposure: Past (AS A CHILD)   Smokeless tobacco: Never  Vaping Use   Vaping Use: Never used  Substance and Sexual Activity   Alcohol use: Yes    Alcohol/week: 3.0 standard drinks of alcohol    Types: 3 Standard drinks or equivalent per week    Comment: GLASS OF WINE A NIGHT   Drug use: No   Sexual activity: Not on file  Other Topics Concern   Not on file  Social History Narrative   Married   Social Determinants of Health   Financial Resource Strain: Not on file  Food Insecurity: Not on file  Transportation Needs: Not on file  Physical Activity: Not on file  Stress: Not on file  Social Connections: Not on file     Family History:  The patient's family history includes Breast cancer in her maternal grandmother, paternal grandmother, and sister; Colon polyps in her paternal aunt;  Heart disease in her paternal grandfather; Stroke in an other family member. There is no history of Coronary artery disease, Colon cancer, Crohn's disease, Esophageal cancer, Stomach cancer, or Rectal cancer.  ROS:   12-point review of systems is negative unless otherwise noted in the HPI.   EKGs/Labs/Other Studies Reviewed:    Studies reviewed were summarized above. The additional studies were reviewed today:  2D echo 11/29/2020: 1. Left ventricular ejection fraction, by estimation, is 60 to 65%. The  left ventricle has normal function. The left ventricle has no regional  wall motion abnormalities. Left ventricular diastolic parameters were  normal. The average left ventricular  global longitudinal strain is -23.5 %.   2. Right ventricular systolic function is normal. The right ventricular  size is normal. There is mildly elevated pulmonary artery systolic  pressure. The estimated right ventricular systolic pressure is 67.6 mmHg.   3. Left atrial size was mildly dilated.   4. The mitral valve is normal in structure. Mild mitral valve  regurgitation.   5. The aortic valve is normal in structure. Aortic valve regurgitation is  mild to moderate. __________   Elwyn Reach patch 07/2019: Normal sinus rhythm with an average heart rate of 65 bpm. 11 runs of supraventricular tachycardia.  The longest lasted 16 beats with an average heart rate of 124 bpm. Rare PACs and rare PVCs. Most triggered events did not correlate with arrhythmia.  However, some correlated with SVT and premature beats. __________   2D echo 12/14/2018: 1. The left ventricle has normal systolic function with an ejection  fraction of 60-65%. The cavity size was normal. Left ventricular diastolic  parameters were normal. No evidence of left ventricular regional wall  motion abnormalities.   2. The right ventricle has normal systolic function. The cavity was  normal. There is no increase in right ventricular wall thickness. Right   ventricular systolic pressure is normal with an estimated pressure of 29.2  mmHg.   3. Left atrial size was mildly dilated.   4. The aortic valve is tricuspid. Aortic valve regurgitation is mild to  moderate by color flow Doppler.   5. The aorta is abnormal unless otherwise noted.   6. There is mild dilatation of the ascending aorta measuring 36 mm.   7. The interatrial septum was not well visualized. __________   Elwyn Reach patch 01/2018:  Normal sinus rhythm with an average heart rate of 67 bpm. 26 episodes of SVT noted.  The longest episode lasted 13 seconds with a heart rate of 117 bpm. Rare PVCs. Most of the patient's reported events of irregular heartbeats and dizziness did not correlate with arrhythmia. __________   2D echo 01/18/2018: - Left ventricle: The cavity size was normal. Wall thickness was    normal. Systolic function was normal. The estimated ejection    fraction was in the range of 60% to 65%. Wall motion was normal;    there were no regional wall motion abnormalities. Left    ventricular diastolic function parameters were normal.  - Aortic valve: There was mild to moderate regurgitation.  - Mitral valve: There was mild regurgitation.  - Left atrium: The atrium was mildly dilated.  - Pulmonary arteries: Systolic pressure was mildly increased. PA    peak pressure: 35 mm Hg (S). ___________   Zio patch 06/2016: Normal sinus rhythm. Short runs of supraventricular tachycardia the longest lasted for 30 seconds. Fastest heart rate during these episodes was 170 bpm. Average heart rate overall was 65 bpm. __________   2D echo 01/29/2015: - Left ventricle: The cavity size was normal. Wall thickness was    normal. Systolic function was normal. The estimated ejection    fraction was in the range of 60% to 65%. Wall motion was normal;    there were no regional wall motion abnormalities. Left    ventricular diastolic function parameters were normal.  - Aortic valve: There was  mild to moderate regurgitation.  - Mitral valve: There was mild regurgitation.  - Pulmonary arteries: Systolic pressure was at the upper limits of    normal. PA peak pressure: 35 mm Hg (S). __________   24-hour Holter 12/2014: NSR with rare APCs, Rare short runs of narrow complex tachycardia consistent with atrial tachycardia __________   2D echo 06/17/2010: - Left ventricle: The cavity size was normal. Wall thickness was    normal. Systolic function was vigorous. The estimated ejection    fraction was in the range of 65% to 70%. Wall motion was normal;    there were no regional wall motion abnormalities. Left ventricular    diastolic function parameters were normal.  - Aortic valve: Mild to moderate regurgitation.  - Ascending aorta: The ascending aorta was mildly dilated.  - Mitral valve: Mild regurgitation.  - Left atrium: The atrium was at the upper limits of normal in size.  - Pulmonary arteries: PA peak pressure: 21m Hg (S) consistent with    borderline to mild pulmonary HTN.   EKG:  EKG is ordered today.  The EKG ordered today demonstrates sinus bradycardia, 56 bpm, no acute ST-T changes  Recent Labs: 11/20/2021: BUN 12; Creatinine, Ser 0.67; Hemoglobin 14.0; Platelets 329; Potassium 4.3; Sodium 141; TSH 3.324  Recent Lipid Panel    Component Value Date/Time   CHOL 231 (H) 10/08/2015 1519   TRIG 112.0 10/08/2015 1519   HDL 106.90 10/08/2015 1519   CHOLHDL 2 10/08/2015 1519   VLDL 22.4 10/08/2015 1519   LDLCALC 101 (H) 10/08/2015 1519    PHYSICAL EXAM:    VS:  BP 138/68 (BP Location: Left Arm, Patient Position: Sitting, Cuff Size: Normal)   Pulse (!) 56   Ht '5\' 4"'$  (1.626 m)   Wt 180 lb (81.6 kg)   SpO2 99%   BMI 30.90 kg/m   BMI: Body mass index is 30.9 kg/m.  Physical Exam Vitals reviewed.  Constitutional:  Appearance: She is well-developed.  HENT:     Head: Normocephalic and atraumatic.  Eyes:     General:        Right eye: No discharge.         Left eye: No discharge.  Neck:     Vascular: No JVD.  Cardiovascular:     Rate and Rhythm: Regular rhythm. Bradycardia present.     Pulses:          Posterior tibial pulses are 2+ on the right side and 2+ on the left side.     Heart sounds: S1 normal and S2 normal. Heart sounds not distant. No midsystolic click and no opening snap. Murmur heard.     Diastolic murmur is present with a grade of 1/4 at the upper right sternal border.     No friction rub.  Pulmonary:     Effort: Pulmonary effort is normal. No respiratory distress.     Breath sounds: Normal breath sounds. No decreased breath sounds, wheezing or rales.  Chest:     Chest wall: No tenderness.  Abdominal:     General: There is no distension.  Musculoskeletal:     Cervical back: Normal range of motion.     Right lower leg: No edema.     Left lower leg: No edema.  Skin:    General: Skin is warm and dry.     Nails: There is no clubbing.  Neurological:     Mental Status: She is alert and oriented to person, place, and time.  Psychiatric:        Speech: Speech normal.        Behavior: Behavior normal.        Thought Content: Thought content normal.        Judgment: Judgment normal.     Wt Readings from Last 3 Encounters:  05/19/22 180 lb (81.6 kg)  11/20/21 176 lb (79.8 kg)  08/18/21 174 lb (78.9 kg)     ASSESSMENT & PLAN:   Paroxysmal SVT/PAF: Maintaining sinus rhythm, though does continue to note intermittent palpitations that will last several seconds in duration.  At times, she is woken from sleep with palpitations leading to the need to take as needed propranolol 3-4 nights per week.  Place ZIO XT to quantify arrhythmia burden to assess if she needs adjustment in current medical therapy.  For now, continue Cardizem CD 180 mg twice daily with as needed propranolol.  CHA2DS2-VASc 2.  She remains on apixaban 5 mg twice daily and does not meet reduced dosing criteria or have symptoms concerning for bleeding.  Aortic  valve disease: Most recent echo from 11/2020 showed stable mild to moderate aortic insufficiency.  Plan to update echo in 11/2022.  HTN: Blood pressure is reasonably controlled in the office.  She remains on Cardizem CD as outlined above.   Disposition: F/u with Dr. Fletcher Anon or an APP in 6 months.   Medication Adjustments/Labs and Tests Ordered: Current medicines are reviewed at length with the patient today.  Concerns regarding medicines are outlined above. Medication changes, Labs and Tests ordered today are summarized above and listed in the Patient Instructions accessible in Encounters.   Signed, Christell Faith, PA-C 05/19/2022 4:07 PM     [address] [place], [place] [address] [phone]

## 2022-05-19 ENCOUNTER — Ambulatory Visit (INDEPENDENT_AMBULATORY_CARE_PROVIDER_SITE_OTHER): Payer: BC Managed Care – PPO

## 2022-05-19 ENCOUNTER — Encounter: Payer: Self-pay | Admitting: Physician Assistant

## 2022-05-19 ENCOUNTER — Ambulatory Visit: Payer: BC Managed Care – PPO | Attending: Physician Assistant | Admitting: Physician Assistant

## 2022-05-19 VITALS — BP 138/68 | HR 56 | Ht 64.0 in | Wt 180.0 lb

## 2022-05-19 DIAGNOSIS — I359 Nonrheumatic aortic valve disorder, unspecified: Secondary | ICD-10-CM

## 2022-05-19 DIAGNOSIS — I10 Essential (primary) hypertension: Secondary | ICD-10-CM

## 2022-05-19 DIAGNOSIS — R002 Palpitations: Secondary | ICD-10-CM

## 2022-05-19 DIAGNOSIS — I480 Paroxysmal atrial fibrillation: Secondary | ICD-10-CM | POA: Diagnosis not present

## 2022-05-19 DIAGNOSIS — I471 Supraventricular tachycardia, unspecified: Secondary | ICD-10-CM | POA: Diagnosis not present

## 2022-05-19 NOTE — Patient Instructions (Signed)
Medication Instructions:  No changes at this time.   *If you need a refill on your cardiac medications before your next appointment, please call your pharmacy*   Lab Work: None  If you have labs (blood work) drawn today and your tests are completely normal, you will receive your results only by: [place] (if you have MyChart) OR A paper copy in the mail If you have any lab test that is abnormal or we need to change your treatment, we will call you to review the results.   Testing/Procedures: Your physician has recommended that you wear a Zio monitor.   This monitor is a medical device that records the heart's electrical activity. Doctors most often use these monitors to diagnose arrhythmias. Arrhythmias are problems with the speed or rhythm of the heartbeat. The monitor is a small device applied to your chest. You can wear one while you do your normal daily activities. While wearing this monitor if you have any symptoms to push the button and record what you felt. Once you have worn this monitor for the period of time provider prescribed (Usually 14 days), you will return the monitor device in the postage paid box. Once it is returned they will download the data collected and provide [place] with a report which the provider will then review and we will call you with those results. Important tips:  Avoid showering during the first 24 hours of wearing the monitor. Avoid excessive sweating to help maximize wear time. Do not submerge the device, no hot tubs, and no swimming pools. Keep any lotions or oils away from the patch. After 24 hours you may shower with the patch on. Take brief showers with your back facing the shower head.  Do not remove patch once it has been placed because that will interrupt data and decrease adhesive wear time. Push the button when you have any symptoms and write down what you were feeling. Once you have completed wearing your monitor, remove and place into box  which has postage paid and place in your outgoing mailbox.  If for some reason you have misplaced your box then call our office and we can provide another box and/or mail it off for you.   Follow-Up: At [facility], you and your health needs are our priority.  As part of our continuing mission to provide you with exceptional heart care, we have created designated Provider Care Teams.  These Care Teams include your primary Cardiologist (physician) and Advanced Practice Providers (APPs -  Physician Assistants and Nurse Practitioners) who all work together to provide you with the care you need, when you need it.   Your next appointment:   6 month(s)  Provider:   Kathlyn Sacramento, MD or Christell Faith, PA-C

## 2022-05-22 DIAGNOSIS — R002 Palpitations: Secondary | ICD-10-CM | POA: Diagnosis not present

## 2022-06-05 ENCOUNTER — Ambulatory Visit
Admission: RE | Admit: 2022-06-05 | Discharge: 2022-06-05 | Disposition: A | Discharge status: Home / Self Care | Payer: BC Managed Care – PPO | Source: Ambulatory Visit | Attending: Obstetrics and Gynecology | Admitting: Obstetrics and Gynecology

## 2022-06-05 DIAGNOSIS — Z780 Asymptomatic menopausal state: Secondary | ICD-10-CM

## 2022-07-13 ENCOUNTER — Other Ambulatory Visit: Payer: Self-pay | Admitting: Cardiovascular Disease

## 2022-07-13 DIAGNOSIS — I2699 Other pulmonary embolism without acute cor pulmonale: Secondary | ICD-10-CM

## 2022-07-14 NOTE — Telephone Encounter (Signed)
Refill request

## 2022-07-14 NOTE — Telephone Encounter (Signed)
Prescription refill request for Eliquis received. Indication: PA Last office visit: 05/19/22 Scr: 0.67  11/20/21 epic Age: 57 Weight: 81kg

## 2022-08-12 ENCOUNTER — Encounter: Payer: Self-pay | Admitting: Cardiovascular Disease

## 2022-09-09 ENCOUNTER — Other Ambulatory Visit: Payer: Self-pay | Admitting: Physician Assistant

## 2022-09-10 ENCOUNTER — Telehealth: Payer: Self-pay | Admitting: Physician Assistant

## 2022-09-10 ENCOUNTER — Other Ambulatory Visit: Payer: Self-pay

## 2022-09-10 ENCOUNTER — Other Ambulatory Visit: Payer: Self-pay | Admitting: *Deleted

## 2022-09-10 MED ORDER — DILTIAZEM HCL ER COATED BEADS 180 MG PO CP24: ORAL_CAPSULE | ORAL | 3 refills | Status: DC

## 2022-09-10 MED ORDER — DILTIAZEM HCL ER COATED BEADS 180 MG PO CP24: 180.0000 mg | ORAL_CAPSULE | Freq: Every day | ORAL | 3 refills | Status: DC

## 2022-09-10 NOTE — Telephone Encounter (Signed)
Reviewed the patient's chart.  She was taking Dilitzem 180 mg BID at her last office visit with Eula Listen, PA and was instructed to continue the same dose after wearing her most recent heart monitor.   I have sent in an updated RX to the pharmacy with the correct dosing and the patient has been notified.  I have asked her to double check the RX at pickup to insure the directions are correct on the bottle # 180 capsules sent in.   The patient voices understanding and is agreeable.

## 2022-09-10 NOTE — Telephone Encounter (Signed)
Pt c/o medication issue:  1. Name of Medication: diltiazem (CARDIZEM CD) 180 MG 24 hr capsule   2. How are you currently taking this medication (dosage and times per day)? 1 tablet twice a day  3. Are you having a reaction (difficulty breathing--STAT)? no  4. What is your medication issue? Patient states her medication was supposed to be sent in for twice a day but it was written for once a day. She says she only has 1 tablet for tonight.

## 2022-09-14 ENCOUNTER — Other Ambulatory Visit: Payer: Self-pay

## 2022-09-14 MED ORDER — DILTIAZEM HCL ER COATED BEADS 180 MG PO CP24: ORAL_CAPSULE | ORAL | 0 refills | Status: DC

## 2022-09-28 ENCOUNTER — Other Ambulatory Visit: Payer: Self-pay

## 2022-09-28 MED ORDER — PROPRANOLOL HCL 20 MG PO TABS: ORAL_TABLET | ORAL | 2 refills | Status: DC

## 2022-10-01 ENCOUNTER — Telehealth: Payer: Self-pay | Admitting: Gastroenterology

## 2022-10-01 NOTE — Telephone Encounter (Signed)
Inbound call from patient requesting a call back to discuss colonoscopy recall date. Please advise, thank you.

## 2022-10-01 NOTE — Telephone Encounter (Signed)
Lm on vm for patient to return call 

## 2022-10-05 ENCOUNTER — Encounter: Payer: Self-pay | Admitting: Cardiovascular Disease

## 2022-10-05 ENCOUNTER — Telehealth: Payer: Self-pay | Admitting: Cardiovascular Disease

## 2022-10-05 NOTE — Telephone Encounter (Signed)
Patient c/o Palpitations:  High priority if patient c/o lightheadedness, shortness of breath, or chest pain  How long have you had palpitations/irregular HR/ Afib? Are you having the symptoms now?  Went into afib around 5:30 AM, took medication and is still in afib  Are you currently experiencing lightheadedness, SOB or CP?  Lightheadedness   Do you have a history of afib (atrial fibrillation) or irregular heart rhythm?  Yes   Have you checked your BP or HR? (document readings if available):  HR ranging 79-102 BP is around 107/85  Are you experiencing any other symptoms?  No

## 2022-10-05 NOTE — Telephone Encounter (Signed)
Yes, I recommend resuming Eliquis 5 mg twice daily.

## 2022-10-06 NOTE — Telephone Encounter (Signed)
The patient had also sent a mychart message through to follow up on this issue. I have responded back to her through MyChart with Dr. Jari Sportsman recommendations to resume Eliquis 5 mg BID.  I asked that she confirm if she has this at home or if a RX needs to be sent in.  Awaiting response through MyChart.

## 2022-10-06 NOTE — Telephone Encounter (Signed)
Called and spoke with patient. I informed her that her colonoscopy recall is not due until 2030 which is 7 years from her last exam. Pt reported that her PCP mentioned maybe having EGD and colonoscopy at the same time just to check on patient's reflux. I told pt that if she was having active upper GI symptoms we will need to get her scheduled for an office visit. Pt states that her symptoms are controlled on current regimen and she does not have any additional concerns at this time. Pt verbalized understanding.

## 2022-10-24 ENCOUNTER — Other Ambulatory Visit: Payer: Self-pay | Admitting: Cardiovascular Disease

## 2022-10-24 DIAGNOSIS — I2699 Other pulmonary embolism without acute cor pulmonale: Secondary | ICD-10-CM

## 2022-10-26 ENCOUNTER — Other Ambulatory Visit: Payer: Self-pay

## 2022-10-26 DIAGNOSIS — I2699 Other pulmonary embolism without acute cor pulmonale: Secondary | ICD-10-CM

## 2022-10-26 MED ORDER — APIXABAN 5 MG PO TABS: ORAL_TABLET | ORAL | 1 refills | Status: DC

## 2022-10-26 NOTE — Telephone Encounter (Signed)
Prescription refill request for Eliquis received. Indication: Afib  Last office visit: 05/19/22 ([place])  Scr: 0.7 (09/29/22)  Age: 57 Weight: 81.6kg  Appropriate dose. Refill sent.

## 2022-10-26 NOTE — Telephone Encounter (Signed)
Refill request

## 2022-11-24 ENCOUNTER — Ambulatory Visit: Payer: BC Managed Care – PPO | Attending: Physician Assistant | Admitting: Physician Assistant

## 2022-11-24 ENCOUNTER — Encounter: Payer: Self-pay | Admitting: Physician Assistant

## 2022-11-24 VITALS — BP 117/76 | HR 58 | Ht 64.0 in | Wt 181.8 lb

## 2022-11-24 DIAGNOSIS — I480 Paroxysmal atrial fibrillation: Secondary | ICD-10-CM

## 2022-11-24 DIAGNOSIS — I471 Supraventricular tachycardia, unspecified: Secondary | ICD-10-CM | POA: Diagnosis not present

## 2022-11-24 DIAGNOSIS — I10 Essential (primary) hypertension: Secondary | ICD-10-CM | POA: Diagnosis not present

## 2022-11-24 DIAGNOSIS — I351 Nonrheumatic aortic (valve) insufficiency: Secondary | ICD-10-CM | POA: Diagnosis not present

## 2022-11-24 NOTE — Progress Notes (Signed)
Cardiology Office Note    Date:  11/24/2022   ID:  Alexis Bradley, Alexis Bradley 1965/06/27, MRN [id number]  PCP:  Damaris Schooner, DO  Cardiologist:  Lorine Bears, MD  Electrophysiologist:  None   Chief Complaint: Follow up  History of Present Illness:   Alexis Bradley is a 57 y.o. female with history of paroxysmal SVT, PAF, PVC, palpitations, PE in the setting of DVT related to ankle fracture, aortic insufficiency, HTN, Covid in 11/2020, and GERD who presents for follow-up of Zio patch.   She was previously evaluated for PAT in 2009 at which time a Holter monitor showed runs of PVCs and SVT. She was treated with metoprolol initially, though she was unable to tolerate this due to bradycardia and fatigue. She was changed to diltiazem which was successful in controlling her heart rate. She underwent a cardiac cath in 2009 that showed minor luminal irregularities and an EF of 60%. Echo in 2016 showed an EF of 60-65%, no RWMA, mild to moderate AI, normal aortic root, mild MR, and PASP 35 mmHg.  She had worsening palpitations in 12/2017 with a documented brief episode of A-fib lasting a few minutes.  Echo showed normal LV systolic function, mild to moderate aortic insufficiency, mild mitral regurgitation, and borderline pulmonary hypertension.  14-day Zio patch showed normal sinus rhythm with recurrent episodes of SVT with the longest episode lasting 13 seconds.  There was no evidence of A-fib.  She was diagnosed with a PE in 2020 in the setting of DVT related to ankle fracture.  She was treated with 6 months of anticoagulation.  Due to increased fatigue and palpitations, she underwent repeat outpatient cardiac monitoring in 07/2019 which demonstrated a predominant rhythm of sinus with 11 runs of SVT with the longest episode lasting 16 beats.  She was seen in the office in 07/2020 for preoperative cardiac risk stratification for upcoming left shoulder arthroscopy, rotator cuff repair, and subacromial decompression.   It was noted at that time she had recently had an elevated D-dimer with subsequent CTA of the chest and bilateral lower extremity venous duplex showing no evidence of PE or DVT respectively.  She subsequently underwent repeat echo in 11/2020, to evaluate her aortic valve disease, which demonstrated an EF of 60 to 65%, no regional wall motion abnormalities, normal LV diastolic function parameters, normal RV systolic function and ventricular cavity size, mildly elevated PASP estimated at 39.8 mmHg, mildly dilated left atrium, mild mitral regurgitation, and mild to moderate aortic insufficiency.  Overall, findings were stable.  She was seen in the ED in 04/2021 with left calf pain with lower extremity venous duplex showing no evidence of DVT.  She was seen in 06/2021 with increased palpitations with diltiazem being titrated to twice daily.  She was seen in the office in 10/2021 and reported a sudden episode of tachycardia while getting her nails done with a heart rate of 155 bpm.  Symptoms lasted for about 30 minutes.  She had a subsequent episode that lasted for a shorter duration.  These episodes were captured on her Apple Watch which were highly suggestive of atrial fibrillation with RVR as reviewed by her primary cardiologist at visit in 10/2021.  Given these findings, aspirin was discontinued and she was initiated on apixaban.  Her overall burden of A-fib was not significant enough to justify antiarrhythmic medication or consideration of ablation.    She was last seen in the office in 04/2022 noting continued intermittent randomly occurring tachypalpitations that were generally  short-lived, lasting a few seconds with spontaneous resolution.  She was taking as needed propranolol 3-4 nights per week.  Zio patch in 04/2022 showed a predominant rhythm of sinus with an average rate of 63 bpm, 14 episodes of SVT lasting up to 18 beats and rare PACs and PVCs.  SVT was detected with symptomatic patient events.  She contacted  our office in 07/2020 requesting to come off of apixaban given no further episodes of documented A-fib, with overall low burden.  She did come off apixaban at that time.  She messaged her office on 10/05/2022 noting recurrence of A-fib by Apple Watch with recommendation to resume apixaban at that time.  She comes in today doing well from a cardiac perspective and is without symptoms of angina or cardiac decompensation.  No dizziness, presyncope, or syncope.  No falls, hematochezia, or melena.  Palpitation burden overall remains well-controlled.  Ideally, she would like to not take apixaban indefinitely.  Blood pressure is better controlled following addition of olmesartan with continuation of diltiazem.  Drinks approximately 1 glass of wine per night, occasionally 2.   Labs independently reviewed: 09/2022 - A1c 5.8, TC 268, TG 192, HDL 98, LDL 132, potassium 3.6, BUN 13, serum creatinine 0.7, albumin 4.0, AST/ALT normal, Hgb 13.4, PLT 311, TSH normal  Past Medical History:  Diagnosis Date   Aortic insufficiency    a. 05/2010 Echo: EF 65-70%; mild to moderate AI (trileaflet AoV), mild MR, Asc Ao dilated to 3.7 cm; b. 01/2015 Echo: EF 60-65%, no rwma, mild to mod AI, nl Ao root, mild MR, PASP .   Atrial fibrillation (HCC)    4-5 YEARS AGO UPDATED 07/28/21   Cancer (HCC)    SKIN   Fatigue    Gastric polyps 06/2010   Endoscopy   GERD (gastroesophageal reflux disease) 05/2009   esophagitis by EGD Feb 2011   Heart murmur    Hiatal hernia    Paroxysmal atrial tachycardia    a. Holter 2009 with PVCs and bigeminy. b. Holter 1/12 with PVCs and short runs of atrial tachycardia (up to 9 beats). c. Holter 12/2014: rare PACs, rare short runs of narrow complex tachycardia c/w atrial tachycardia; d. 06/2016 Event monitor: runs of SVT.   PAT (paroxysmal atrial tachycardia)    Poor circulation of extremity    Bil legs   Premature atrial contractions    PVC's (premature ventricular contractions)    S/P left  heart catheterization by percutaneous approach 2009   a. cath 11/2007: minor luminal irregularities, EF 60%.   Sinus bradycardia    a. HR occasionally 40s per patient, rare dips to 30s.   Varicose veins     Past Surgical History:  Procedure Laterality Date   ABDOMINAL HYSTERECTOMY     CARDIAC CATHETERIZATION  04/28/2007   Left heart cath   CESAREAN SECTION     CHOLECYSTECTOMY     Right GSV ELAS with 10-20 stab phlebectomies right calf  05/25/2011   ROTATOR CUFF REPAIR Left    VESICOVAGINAL FISTULA CLOSURE W/ TAH      Current Medications: Current Meds  Medication Sig   apixaban (ELIQUIS) 5 MG TABS tablet TAKE 1 TABLET(5 MG) BY MOUTH TWICE DAILY   diltiazem (CARDIZEM CD) 180 MG 24 hr capsule Take 1 capsule (180 mg) by mouth twice daily   doxycycline (VIBRAMYCIN) 100 MG capsule Take 100 mg by mouth 2 (two) times daily.   Multiple Vitamin (MULTIVITAMIN) tablet Take 1 tablet by mouth daily.   OLMESARTAN MEDOXOMIL  PO Take 20 mg by mouth daily.   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    Comment: Also works in teh administration  Tobacco Use   Smoking status: Never    Passive exposure: Past (AS A CHILD)   Smokeless tobacco: Never  Vaping Use   Vaping status: Never Used  Substance and Sexual Activity   Alcohol use: Yes    Alcohol/week: 3.0 standard drinks of alcohol    Types: 3 Standard drinks or equivalent per week    Comment: GLASS OF WINE A NIGHT   Drug use: No   Sexual activity: Not on file  Other Topics  Concern   Not on file  Social History Narrative   Married   Social Determinants of Health   Financial Resource Strain: Low Risk  (09/25/2022)   Received from [facility], [facility]   Overall Financial Resource Strain (CARDIA)    Difficulty of Paying Living Expenses: Not hard at all  Food Insecurity: No Food Insecurity (09/25/2022)   Received from [facility], [facility]   Hunger Vital Sign    Worried About Running Out of Food in the Last Year: Never true    Ran Out of Food in the Last Year: Never true  Transportation Needs: No Transportation Needs (09/25/2022)   Received from [facility], [facility]   [facility] - Transportation    In the past 12 months, has lack of transportation kept you from medical appointments or from getting medications?: No    Lack of Transportation (Non-Medical): No  Physical Activity: Not on file  Stress: Not on file  Social Connections: Not on file     Family History:  The patient's family history includes Breast cancer in her maternal grandmother, paternal grandmother, and sister; Colon polyps in her paternal aunt; Heart disease in her paternal grandfather; Stroke in an other family member. There is no history of Coronary artery disease, Colon cancer, Crohn's disease, Esophageal cancer, Stomach cancer, or Rectal cancer.  ROS:   12-point review of systems is negative unless otherwise noted in the HPI.   EKGs/Labs/Other Studies Reviewed:    Studies reviewed were summarized above. The additional studies were reviewed today:  Zio patch 04/2022: Patient had a min HR of 48 bpm, max HR of 169 bpm, and avg HR of 63 bpm. Predominant underlying rhythm was Sinus Rhythm. Slight P wave morphology changes were noted.  14 Supraventricular Tachycardia runs occurred, the run with the fastest interval lasting 13 beats with a max rate of 169 bpm, the longest lasting  18 beats with an avg rate of 97 bpm. Some episodes of Supraventricular Tachycardia may be possible Atrial Tachycardia with variable block. Supraventricular Tachycardia was detected within +/- 45 seconds of symptomatic patient event(s).  Rare PACs and rare PVCs. __________  2D echo 11/29/2020: 1. Left ventricular ejection fraction, by estimation, is 60 to 65%. The  left ventricle has normal function. The left ventricle has no regional  wall motion abnormalities. Left ventricular diastolic parameters were  normal. The average left ventricular  global longitudinal strain is -23.5 %.   2. Right ventricular systolic function is normal. The right ventricular  size is normal. There is mildly elevated pulmonary artery systolic  pressure. The estimated right ventricular systolic pressure is 39.8 mmHg.   3. Left atrial size was mildly dilated.   4. The mitral valve is normal in structure. Mild mitral valve  regurgitation.   5. The aortic valve is normal in structure. Aortic valve regurgitation is  mild to moderate. __________   Luci Bank patch 07/2019: Normal sinus rhythm with an average heart rate of 65 bpm. 11 runs of supraventricular tachycardia.  The longest lasted 16 beats with an average heart rate of 124 bpm. Rare PACs and rare PVCs. Most triggered events did not correlate with arrhythmia.  However, some correlated with SVT and premature beats. __________   2D echo 12/14/2018: 1. The left ventricle has normal systolic function with an ejection  fraction of 60-65%. The cavity size was normal. Left ventricular diastolic  parameters were normal. No evidence of left ventricular regional wall  motion abnormalities.   2. The right ventricle has normal systolic function. The cavity was  normal. There is no increase in right ventricular wall thickness. Right  ventricular systolic pressure is normal with an estimated pressure of 29.2  mmHg.   3. Left atrial size was mildly dilated.   4. The aortic  valve is tricuspid. Aortic valve regurgitation is mild to  moderate by color flow Doppler.   5. The aorta is abnormal unless otherwise noted.   6. There is mild dilatation of the ascending aorta measuring 36 mm.   7. The interatrial septum was not well visualized. __________   Luci Bank patch 01/2018: Normal sinus rhythm with an average heart rate of 67 bpm. 26 episodes of SVT noted.  The longest episode lasted 13 seconds with a heart rate of 117 bpm. Rare PVCs. Most of the patient's reported events of irregular heartbeats and dizziness did not correlate with arrhythmia. __________   2D echo 01/18/2018: - Left ventricle: The cavity size was normal. Wall thickness was    normal. Systolic function was normal. The estimated ejection    fraction was in the range of 60% to 65%. Wall motion was normal;    there were no regional wall motion abnormalities. Left    ventricular diastolic function parameters were normal.  - Aortic valve: There was mild to moderate regurgitation.  - Mitral valve: There was mild regurgitation.  - Left atrium: The atrium was mildly dilated.  - Pulmonary arteries: Systolic pressure was mildly increased. PA    peak pressure: 35 mm Hg (S). ___________   Zio patch 06/2016: Normal sinus rhythm. Short runs of supraventricular tachycardia the longest lasted for 30 seconds. Fastest heart rate during these episodes was 170 bpm. Average heart rate overall was 65 bpm. __________   2D echo 01/29/2015: - Left ventricle: The cavity size was normal. Wall thickness was    normal. Systolic function was normal. The estimated ejection    fraction was in the range of 60% to 65%. Wall motion was normal;    there were no regional wall motion abnormalities. Left    ventricular diastolic function parameters were normal.  - Aortic valve: There was mild to moderate regurgitation.  - Mitral valve: There was mild regurgitation.  - Pulmonary arteries: Systolic  pressure was at the upper limits  of    normal. PA peak pressure: 35 mm Hg (S). __________   24-hour Holter 12/2014: NSR with rare APCs, Rare short runs of narrow complex tachycardia consistent with atrial tachycardia __________   2D echo 06/17/2010: - Left ventricle: The cavity size was normal. Wall thickness was    normal. Systolic function was vigorous. The estimated ejection    fraction was in the range of 65% to 70%. Wall motion was normal;    there were no regional wall motion abnormalities. Left ventricular    diastolic function parameters were normal.  - Aortic valve: Mild to moderate regurgitation.  - Ascending aorta: The ascending aorta was mildly dilated.  - Mitral valve: Mild regurgitation.  - Left atrium: The atrium was at the upper limits of normal in size.  - Pulmonary arteries: PA peak pressure: 35mm Hg (S) consistent with    borderline to mild pulmonary HTN.   EKG:  EKG is ordered today.  The EKG ordered today demonstrates sinus bradycardia, 50 bpm, no acute ST-T changes  Recent Labs: No results found for requested labs within last 365 days.  Recent Lipid Panel    Component Value Date/Time   CHOL 231 (H) 10/08/2015 1519   TRIG 112.0 10/08/2015 1519   HDL 106.90 10/08/2015 1519   CHOLHDL 2 10/08/2015 1519   VLDL 22.4 10/08/2015 1519   LDLCALC 101 (H) 10/08/2015 1519    PHYSICAL EXAM:    VS:  BP 117/76 (BP Location: Left Arm, Patient Position: Sitting, Cuff Size: Large)   Pulse (!) 58   Ht 5\' 4"  (1.626 m)   Wt 181 lb 12.8 oz (82.5 kg)   SpO2 99%   BMI 31.21 kg/m   BMI: Body mass index is 31.21 kg/m.  Physical Exam Vitals reviewed.  Constitutional:      Appearance: She is well-developed.  HENT:     Head: Normocephalic and atraumatic.  Eyes:     General:        Right eye: No discharge.        Left eye: No discharge.  Neck:     Vascular: No JVD.  Cardiovascular:     Rate and Rhythm: Normal rate and regular rhythm.     Heart sounds: S1 normal and S2 normal. Heart sounds not  distant. No midsystolic click and no opening snap. Murmur heard.     High-pitched blowing decrescendo early diastolic murmur is present with a grade of 1/4 at the upper right sternal border radiating to the apex.     No friction rub.  Pulmonary:     Effort: Pulmonary effort is normal. No respiratory distress.     Breath sounds: Normal breath sounds. No decreased breath sounds, wheezing or rales.  Chest:     Chest wall: No tenderness.  Abdominal:     General: There is no distension.  Musculoskeletal:     Cervical back: Normal range of motion.  Skin:    General: Skin is warm and dry.     Nails: There is no clubbing.  Neurological:     Mental Status: She is alert and oriented to person, place, and time.  Psychiatric:        Speech: Speech normal.        Behavior: Behavior normal.        Thought Content: Thought content normal.        Judgment: Judgment normal.     Wt Readings from Last 3 Encounters:  11/24/22 181 lb 12.8 oz (82.5 kg)  05/19/22 180 lb (81.6 kg)  11/20/21 176 lb (79.8 kg)     ASSESSMENT & PLAN:   Paroxysmal SVT/PAF: Maintaining sinus rhythm.  She did have documented recurrence of A-fib in 09/2022, at which time it was recommended she resume apixaban, which will be continued.  Ideally, she would like to avoid taking anticoagulation long-term.  Will refer to EP for consideration of antiarrhythmic therapy versus ablation.  Also query if she would be a candidate for Watchman.  Continue diltiazem 180 mg twice daily as needed propranolol.   Aortic valve disease: Most recent echo from 11/2020 showed stable mild to moderate aortic insufficiency.  Update echo.  HTN: Blood pressure is well-controlled on diltiazem and olmesartan.   Disposition: F/u with Dr. Kirke Corin or an APP in 6 months.   Medication Adjustments/Labs and Tests Ordered: Current medicines are reviewed at length with the patient today.  Concerns regarding medicines are outlined above. Medication changes, Labs  and Tests ordered today are summarized above and listed in the Patient Instructions accessible in Encounters.   Signed, Eula Listen, PA-C 11/24/2022 4:29 PM     [facility] [place] [address] [place], [place] [address] [phone]

## 2022-11-24 NOTE — Patient Instructions (Addendum)
Medication Instructions:  No changes at this time.   *If you need a refill on your cardiac medications before your next appointment, please call your pharmacy*   Lab Work: None  If you have labs (blood work) drawn today and your tests are completely normal, you will receive your results only by: MyChart Message (if you have MyChart) OR A paper copy in the mail If you have any lab test that is abnormal or we need to change your treatment, we will call you to review the results.   Testing/Procedures: Your physician has requested that you have an echocardiogram. Echocardiography is a painless test that uses sound waves to create images of your heart. It provides your doctor with information about the size and shape of your heart and how well your heart's chambers and valves are working. This procedure takes approximately one hour. There are no restrictions for this procedure. Please do NOT wear cologne, perfume, aftershave, or lotions (deodorant is allowed). Please arrive 15 minutes prior to your appointment time.    Follow-Up: At [facility], you and your health needs are our priority.  As part of our continuing mission to provide you with exceptional heart care, we have created designated Provider Care Teams.  These Care Teams include your primary Cardiologist (physician) and Advanced Practice Providers (APPs -  Physician Assistants and Nurse Practitioners) who all work together to provide you with the care you need, when you need it.   Your next appointment:   6 month(s)  Provider:   Lorine Bears, MD or Eula Listen, PA-C    Other Instructions Referral placed for you to see electrophysiologist (EP) provider to discuss Afib ablation verses antiarrythmic

## 2022-12-05 ENCOUNTER — Other Ambulatory Visit: Payer: Self-pay | Admitting: Physician Assistant

## 2022-12-23 ENCOUNTER — Ambulatory Visit: Payer: BC Managed Care – PPO | Attending: Physician Assistant

## 2022-12-23 DIAGNOSIS — I351 Nonrheumatic aortic (valve) insufficiency: Secondary | ICD-10-CM | POA: Diagnosis not present

## 2022-12-23 DIAGNOSIS — I471 Supraventricular tachycardia, unspecified: Secondary | ICD-10-CM | POA: Diagnosis not present

## 2022-12-23 DIAGNOSIS — I480 Paroxysmal atrial fibrillation: Secondary | ICD-10-CM | POA: Diagnosis not present

## 2022-12-23 LAB — ECHOCARDIOGRAM COMPLETE
Area-P 1/2: 2.6 cm2
P 1/2 time: 588 msec
S' Lateral: 2.3 cm

## 2022-12-30 ENCOUNTER — Ambulatory Visit: Payer: BC Managed Care – PPO | Admitting: Cardiology

## 2023-02-03 ENCOUNTER — Institutional Professional Consult (permissible substitution): Payer: BC Managed Care – PPO | Admitting: Cardiology

## 2023-03-10 ENCOUNTER — Encounter: Payer: Self-pay | Admitting: Cardiology

## 2023-03-10 ENCOUNTER — Ambulatory Visit: Payer: BC Managed Care – PPO | Attending: Cardiology | Admitting: Cardiology

## 2023-03-10 VITALS — BP 116/78 | HR 63 | Ht 64.0 in | Wt 186.4 lb

## 2023-03-10 DIAGNOSIS — I10 Essential (primary) hypertension: Secondary | ICD-10-CM | POA: Diagnosis not present

## 2023-03-10 DIAGNOSIS — I480 Paroxysmal atrial fibrillation: Secondary | ICD-10-CM

## 2023-03-10 NOTE — Patient Instructions (Signed)
Medication Instructions:  Your physician recommends that you continue on your current medications as directed. Please refer to the Current Medication list given to you today.  *If you need a refill on your cardiac medications before your next appointment, please call your pharmacy*  Follow-Up: At [facility], you and your health needs are our priority.  As part of our continuing mission to provide you with exceptional heart care, we have created designated Provider Care Teams.  These Care Teams include your primary Cardiologist (physician) and Advanced Practice Providers (APPs -  Physician Assistants and Nurse Practitioners) who all work together to provide you with the care you need, when you need it.  Your next appointment:   Give [place] a call if you would like to schedule an ablation

## 2023-03-10 NOTE — Progress Notes (Signed)
  Electrophysiology Office Note:    Date:  03/10/2023   ID:  Alexis Bradley, DOB [birth year], MRN [id number]  [facility] Cardiologist:  Lorine Bears, MD  [facility] Electrophysiologist:  Lanier Prude, MD   Referring MD: Sondra Barges, PA-C   Chief Complaint: AF  History of Present Illness:      Discussed the use of AI scribe software for clinical note transcription with the patient, who gave verbal consent to proceed.  History of Present Illness   Alexis Bradley, a 57 year old with a history of SVT, atrial fibrillation, PVCs, pulmonary embolism and DVT, aortic insufficiency, hypertension, and GERD, presents for a consultation regarding her atrial fibrillation. Her atrial fibrillation was initially detected by her Apple Watch and subsequently confirmed by EKG. She has had several episodes of atrial fibrillation, which she describes as feeling "weird," with symptoms including tachycardia, sweating, and cognitive impairment. The duration of these episodes varies, with the longest lasting approximately five to six hours. The patient has been on and off Eliquis for anticoagulation, which was initially started due to a DVT and PE following an ankle fracture. She has not had any bleeding complications from the Eliquis. The patient is considering treatment options for her atrial fibrillation, including medication, ablation, and the Watchman procedure.               Their past medical, social and family history was reveiwed.   ROS:   Please see the history of present illness.    All other systems reviewed and are negative.  EKGs/Labs/Other Studies Reviewed:    The following studies were reviewed today:   Tracings from [employer] reviewed and apple watch reviewed. All confirm AF.       Physical Exam:    VS:  BP 116/78 (BP Location: Left Arm, Patient Position: Sitting, Cuff Size: Large)   Pulse 63   Ht 5\' 4"  (1.626 m)   Wt 186 lb 6.4 oz (84.6 kg)    SpO2 98%   BMI 32.00 kg/m     Wt Readings from Last 3 Encounters:  03/10/23 186 lb 6.4 oz (84.6 kg)  11/24/22 181 lb 12.8 oz (82.5 kg)  05/19/22 180 lb (81.6 kg)     Physical Exam   GEN: Well appearing. no distress CARD: RRR, no MRG RESP: No IWOB. CTAB.           ASSESSMENT AND PLAN:    1. PAF (paroxysmal atrial fibrillation) (HCC)   2. Essential hypertension       Assessment and Plan    Atrial Fibrillation Multiple episodes of symptomatic AFib, with the most recent episode lasting several hours. EKG confirmed AFib. CHADS-VASc score of 4, indicating a high annual stroke risk. Currently on Eliquis for anticoagulation. Discussed the risks and benefits of catheter ablation vs antiarrhythmic drugs. -Recommend catheter ablation for rhythm control. -Continue Eliquis for stroke prevention.  Mild to Moderate Aortic Insufficiency and Mild Mitral Regurgitation No change in symptoms or severity. Discussed the potential impact on AFib and the potential need for valve treatment in the future. -Monitor for changes in symptoms or severity.               Signed, Rossie Muskrat. Lalla Brothers, MD, [facility], [facility] 03/10/2023 3:54 PM    Electrophysiology [facility]

## 2023-05-03 ENCOUNTER — Telehealth: Payer: Self-pay | Admitting: Cardiovascular Disease

## 2023-05-03 DIAGNOSIS — I2699 Other pulmonary embolism without acute cor pulmonale: Secondary | ICD-10-CM

## 2023-05-03 NOTE — Telephone Encounter (Signed)
*  STAT* If patient is at the pharmacy, call can be transferred to refill team.   1. Which medications need to be refilled? (please list name of each medication and dose if known)           apixaban  (ELIQUIS ) 5 MG TABS tablet      4. Which pharmacy/location (including street and city if local pharmacy) is medication to be sent to?  [id number] - [place], [place] - [address] AT [facility] [address] [facility] [address] Phone: [phone]  Fax: [phone]       5. Do they need a 30 day or 90 day supply? 90    Pt states she is completely out

## 2023-05-04 MED ORDER — APIXABAN 5 MG PO TABS: ORAL_TABLET | ORAL | 1 refills | Status: DC

## 2023-05-04 NOTE — Telephone Encounter (Signed)
 Prescription refill request for Eliquis received. Indication:afib Last office visit:11/24 Scr:0.7  6/24 Age: 58 Weight:84.6  kg  Prescription refilled

## 2023-05-24 ENCOUNTER — Other Ambulatory Visit: Payer: Self-pay

## 2023-05-24 MED ORDER — DILTIAZEM HCL ER COATED BEADS 180 MG PO CP24: 180.0000 mg | ORAL_CAPSULE | Freq: Two times a day (BID) | ORAL | 0 refills | Status: DC

## 2023-05-24 NOTE — Telephone Encounter (Signed)
LVM to schedule f/u appt

## 2023-05-24 NOTE — Telephone Encounter (Signed)
  Requested Prescriptions   Signed Prescriptions Disp Refills   diltiazem (CARDIZEM CD) 180 MG 24 hr capsule 180 capsule 0    Sig: Take 1 capsule (180 mg total) by mouth in the morning and at bedtime. PLEASE CALL OFFICE TO SCHEDULE APPOINTMENT PRIOR TO NEXT REFILL    Authorizing Provider: Lorine Bears A    Ordering User: Guerry Minors

## 2023-05-24 NOTE — Telephone Encounter (Signed)
Last office visit: 11/24/22 with plan to f/u 6 months. next office visit: none/active recall  Please schedule f/u appt.  Thanks!

## 2023-05-26 ENCOUNTER — Other Ambulatory Visit: Payer: Self-pay

## 2023-05-26 DIAGNOSIS — I2699 Other pulmonary embolism without acute cor pulmonale: Secondary | ICD-10-CM

## 2023-05-26 MED ORDER — APIXABAN 5 MG PO TABS: ORAL_TABLET | ORAL | 1 refills | Status: DC

## 2023-05-31 NOTE — Progress Notes (Signed)
 Cardiology Office Note    Date:  06/01/2023   ID:  Alexis Bradley, DOB 12-29-1965, MRN [id number]  PCP:  Alexis Rogue, DO  Cardiologist:  Alexis Cage, MD  Electrophysiologist:  Alexis ONEIDA HOLTS, MD   Chief Complaint: Follow-up  History of Present Illness:   Alexis Bradley is a 58 y.o. female with history of paroxysmal SVT, PAF, PVC, palpitations, PE in the setting of DVT related to ankle fracture, aortic insufficiency, mitral regurgitation, HTN, and GERD who presents for follow-up of A-fib.    She was previously evaluated for PAT in 2009 at which time a Holter monitor showed runs of PVCs and SVT. She was treated with metoprolol  initially, though she was unable to tolerate this due to bradycardia and fatigue. She was changed to diltiazem  which was successful in controlling her heart rate. She underwent a cardiac cath in 2009 that showed minor luminal irregularities and an EF of 60%. Echo in 2016 showed an EF of 60-65%, no RWMA, mild to moderate AI, normal aortic root, mild MR, and PASP 35 mmHg.  She had worsening palpitations in 12/2017 with a documented brief episode of A-fib lasting a few minutes.  Echo showed normal LV systolic function, mild to moderate aortic insufficiency, mild mitral regurgitation, and borderline pulmonary hypertension.  14-day Zio patch showed normal sinus rhythm with recurrent episodes of SVT with the longest episode lasting 13 seconds.  There was no evidence of A-fib.  She was diagnosed with a PE in 2020 in the setting of DVT related to ankle fracture.  She was treated with 6 months of anticoagulation.  Due to increased fatigue and palpitations, she underwent repeat outpatient cardiac monitoring in 07/2019 which demonstrated a predominant rhythm of sinus with 11 runs of SVT with the longest episode lasting 16 beats.  She was seen in the office in 07/2020 for preoperative cardiac risk stratification for upcoming left shoulder arthroscopy, rotator cuff repair, and  subacromial decompression.  It was noted at that time she had recently had an elevated D-dimer with subsequent CTA of the chest and bilateral lower extremity venous duplex showing no evidence of PE or DVT respectively.  She subsequently underwent repeat echo in 11/2020, to evaluate her aortic valve disease, which demonstrated an EF of 60 to 65%, no regional wall motion abnormalities, normal LV diastolic function parameters, normal RV systolic function and ventricular cavity size, mildly elevated PASP estimated at 39.8 mmHg, mildly dilated left atrium, mild mitral regurgitation, and mild to moderate aortic insufficiency.  Overall, findings were stable.  She was seen in the ED in 04/2021 with left calf pain with lower extremity venous duplex showing no evidence of DVT.  She was seen in 06/2021 with increased palpitations with diltiazem  being titrated to twice daily.  She was seen in the office in 10/2021 and reported a sudden episode of tachycardia while getting her nails done with a heart rate of 155 bpm.  Symptoms lasted for about 30 minutes.  She had a subsequent episode that lasted for a shorter duration.  These episodes were captured on her Apple Watch which were highly suggestive of atrial fibrillation with RVR as reviewed by her primary cardiologist at visit in 10/2021.  Given these findings, aspirin was discontinued and she was initiated on apixaban .  Her overall burden of A-fib was not significant enough to justify antiarrhythmic medication or consideration of ablation.     She was seen in the office in 04/2022 noting continued intermittent randomly occurring tachypalpitations that were  generally short-lived, lasting a few seconds with spontaneous resolution.  She was taking as needed propranolol  3-4 nights per week.  Zio patch in 04/2022 showed a predominant rhythm of sinus with an average rate of 63 bpm, 14 episodes of SVT lasting up to 18 beats and rare PACs and PVCs.  SVT was detected with symptomatic patient  events.   She contacted our office in 07/2022 requesting to come off of apixaban  given no further episodes of documented A-fib, with overall low burden.  She did come off apixaban  at that time.  She messaged our office on 10/05/2022 noting recurrence of A-fib by Apple Watch with recommendation to resume apixaban  at that time.  She was seen on 11/24/2022 and reported that her palpitation burden was overall well-controlled.  She was maintaining sinus rhythm with a bradycardic rate at 50 bpm.  She preferred to avoid taking anticoagulation long-term.  In this setting she was referred to EP for further recommendations.  She was evaluated by Alexis Bradley in 02/2023 with recommendation for catheter ablation if patient wanted to proceed.  It was advised that she continue apixaban  for stroke prevention.  She comes in doing very well from a cardiac perspective and remains without symptoms of angina or cardiac decompensation.  No symptoms concerning for A-fib recurrence.  No dizziness, presyncope, or syncope.  She has not been as active as she would like to, though would like to reinitiate her walking regimen as the weather improves.  No falls, hematochezia, or melena.  Remains on apixaban .  Remains uncertain if she would want to move forward with cardiac ablation for A-fib.  Overall feels well from a cardiac perspective.   Labs independently reviewed: 04/2023 - A1c 6, potassium 3.9, BUN 6, serum creatinine 0.9 09/2022 - TC 268, TG 192, HDL 98, LDL 132, albumin 4.0, AST/ALT normal, Hgb 13.4, PLT 311, TSH normal   Past Medical History:  Diagnosis Date   Aortic insufficiency    a. 05/2010 Echo: EF 65-70%; mild to moderate AI (trileaflet AoV), mild MR, Asc Ao dilated to 3.7 cm; b. 01/2015 Echo: EF 60-65%, no rwma, mild to mod AI, nl Ao root, mild MR, PASP .   Atrial fibrillation (HCC)    4-5 YEARS AGO UPDATED 07/28/21   Cancer (HCC)    SKIN   Fatigue    Gastric polyps 06/2010   Endoscopy   GERD  (gastroesophageal reflux disease) 05/2009   esophagitis by EGD Feb 2011   Heart murmur    Hiatal hernia    Paroxysmal atrial tachycardia (HCC)    a. Holter 2009 with PVCs and bigeminy. b. Holter 1/12 with PVCs and short runs of atrial tachycardia (up to 9 beats). c. Holter 12/2014: rare PACs, rare short runs of narrow complex tachycardia c/w atrial tachycardia; d. 06/2016 Event monitor: runs of SVT.   PAT (paroxysmal atrial tachycardia) (HCC)    Poor circulation of extremity    Bil legs   Premature atrial contractions    PVC's (premature ventricular contractions)    S/P left heart catheterization by percutaneous approach 2009   a. cath 11/2007: minor luminal irregularities, EF 60%.   Sinus bradycardia    a. HR occasionally 40s per patient, rare dips to 30s.   Varicose veins     Past Surgical History:  Procedure Laterality Date   ABDOMINAL HYSTERECTOMY     CARDIAC CATHETERIZATION  04/28/2007   Left heart cath   CESAREAN SECTION     CHOLECYSTECTOMY     Right GSV  ELAS with 10-20 stab phlebectomies right calf  05/25/2011   ROTATOR CUFF REPAIR Left    VESICOVAGINAL FISTULA CLOSURE W/ TAH      Current Medications: Current Meds  Medication Sig   apixaban  (ELIQUIS ) 5 MG TABS tablet TAKE 1 TABLET(5 MG) BY MOUTH TWICE DAILY   Calcium Phosphate Tribasic POWD    diltiazem  (CARDIZEM  CD) 180 MG 24 hr capsule Take 1 capsule (180 mg total) by mouth in the morning and at bedtime. PLEASE CALL OFFICE TO SCHEDULE APPOINTMENT PRIOR TO NEXT REFILL   Multiple Vitamin (MULTIVITAMIN) tablet Take 1 tablet by mouth daily.   OLMESARTAN MEDOXOMIL PO Take 20 mg by mouth daily.   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    Comment: Also works in teh administration  Tobacco Use   Smoking status: Never    Passive exposure: Past (AS A CHILD)   Smokeless tobacco: Never  Vaping Use   Vaping status: Never Used  Substance and Sexual Activity   Alcohol use: Yes    Alcohol/week: 3.0 standard drinks of alcohol    Types: 3 Standard drinks or equivalent per week    Comment: GLASS OF WINE A NIGHT   Drug use: No   Sexual activity: Not on file  Other Topics Concern   Not on file  Social History Narrative   Married   Social Drivers of Health   Financial Resource Strain: Low Risk  (09/25/2022)   Received from [facility], [facility]   Overall Financial Resource Strain (CARDIA)    Difficulty of Paying Living Expenses: Not hard at all  Food Insecurity: No Food Insecurity (09/25/2022)   Received from [facility], [facility]   Hunger Vital Sign    Worried About Running Out of Food in the Last Year: Never true    Ran Out of Food in the Last Year: Never true  Transportation Needs: No Transportation Needs (09/25/2022)   Received from [facility], [facility]   [facility] - Transportation    In the past 12 months, has lack of transportation kept you from medical appointments or from getting medications?: No    Lack of Transportation (Non-Medical): No  Physical Activity: Not on file  Stress: Not on file  Social Connections: Not on file     Family History:  The patient's family history includes Breast cancer in her maternal grandmother, paternal grandmother, and sister; Colon polyps in her paternal aunt; Heart disease in her paternal grandfather; Stroke in an other family member. There is no history of Coronary artery disease, Colon  cancer, Crohn's disease, Esophageal cancer, Stomach cancer, or Rectal cancer.  ROS:   12-point review of systems is negative unless otherwise noted in the HPI.   EKGs/Labs/Other Studies Reviewed:    Studies reviewed were summarized above. The additional studies were reviewed today:  2D echo 12/23/2022: 1. Left ventricular ejection fraction, by  estimation, is 60 to 65%. The  left ventricle has normal function. The left ventricle has no regional  wall motion abnormalities. Left ventricular diastolic parameters were  normal. The average left ventricular  global longitudinal strain is -27.9 %.   2. Right ventricular systolic function is normal. The right ventricular  size is normal.   3. Left atrial size was moderately dilated.   4. The mitral valve is normal in structure. Mild mitral valve  regurgitation. No evidence of mitral stenosis.   5. The aortic valve is tricuspid. Aortic valve regurgitation is mild to  moderate. No aortic stenosis is present.   6. There is borderline dilatation of the ascending aorta, measuring 38  mm.   7. The inferior vena cava is normal in size with greater than 50%  respiratory variability, suggesting right atrial pressure of 3 mmHg.  __________  Zio patch 04/2022: Patient had a min HR of 48 bpm, max HR of 169 bpm, and avg HR of 63 bpm. Predominant underlying rhythm was Sinus Rhythm. Slight P wave morphology changes were noted.  14 Supraventricular Tachycardia runs occurred, the run with the fastest interval lasting 13 beats with a max rate of 169 bpm, the longest lasting 18 beats with an avg rate of 97 bpm. Some episodes of Supraventricular Tachycardia may be possible Atrial Tachycardia with variable block. Supraventricular Tachycardia was detected within +/- 45 seconds of symptomatic patient event(s).  Rare PACs and rare PVCs. __________   2D echo 11/29/2020: 1. Left ventricular ejection fraction, by estimation, is 60 to 65%. The  left ventricle has normal  function. The left ventricle has no regional  wall motion abnormalities. Left ventricular diastolic parameters were  normal. The average left ventricular  global longitudinal strain is -23.5 %.   2. Right ventricular systolic function is normal. The right ventricular  size is normal. There is mildly elevated pulmonary artery systolic  pressure. The estimated right ventricular systolic pressure is 39.8 mmHg.   3. Left atrial size was mildly dilated.   4. The mitral valve is normal in structure. Mild mitral valve  regurgitation.   5. The aortic valve is normal in structure. Aortic valve regurgitation is  mild to moderate. __________   Zio patch 07/2019: Normal sinus rhythm with an average heart rate of 65 bpm. 11 runs of supraventricular tachycardia.  The longest lasted 16 beats with an average heart rate of 124 bpm. Rare PACs and rare PVCs. Most triggered events did not correlate with arrhythmia.  However, some correlated with SVT and premature beats. __________   2D echo 12/14/2018: 1. The left ventricle has normal systolic function with an ejection  fraction of 60-65%. The cavity size was normal. Left ventricular diastolic  parameters were normal. No evidence of left ventricular regional wall  motion abnormalities.   2. The right ventricle has normal systolic function. The cavity was  normal. There is no increase in right ventricular wall thickness. Right  ventricular systolic pressure is normal with an estimated pressure of 29.2  mmHg.   3. Left atrial size was mildly dilated.   4. The aortic valve is tricuspid. Aortic valve regurgitation is mild to  moderate by color flow Doppler.   5. The aorta is abnormal unless otherwise noted.   6. There is mild dilatation of the ascending aorta measuring 36 mm.   7. The interatrial septum was not well visualized. __________   Zio patch 01/2018: Normal sinus rhythm with an average heart rate of 67 bpm. 26 episodes  of SVT noted.  The  longest episode lasted 13 seconds with a heart rate of 117 bpm. Rare PVCs. Most of the patient's reported events of irregular heartbeats and dizziness did not correlate with arrhythmia. __________   2D echo 01/18/2018: - Left ventricle: The cavity size was normal. Wall thickness was    normal. Systolic function was normal. The estimated ejection    fraction was in the range of 60% to 65%. Wall motion was normal;    there were no regional wall motion abnormalities. Left    ventricular diastolic function parameters were normal.  - Aortic valve: There was mild to moderate regurgitation.  - Mitral valve: There was mild regurgitation.  - Left atrium: The atrium was mildly dilated.  - Pulmonary arteries: Systolic pressure was mildly increased. Alexis    peak pressure: 35 mm Hg (S). ___________   Zio patch 06/2016: Normal sinus rhythm. Short runs of supraventricular tachycardia the longest lasted for 30 seconds. Fastest heart rate during these episodes was 170 bpm. Average heart rate overall was 65 bpm. __________   2D echo 01/29/2015: - Left ventricle: The cavity size was normal. Wall thickness was    normal. Systolic function was normal. The estimated ejection    fraction was in the range of 60% to 65%. Wall motion was normal;    there were no regional wall motion abnormalities. Left    ventricular diastolic function parameters were normal.  - Aortic valve: There was mild to moderate regurgitation.  - Mitral valve: There was mild regurgitation.  - Pulmonary arteries: Systolic pressure was at the upper limits of    normal. Alexis peak pressure: 35 mm Hg (S). __________   24-hour Holter 12/2014: NSR with rare APCs, Rare short runs of narrow complex tachycardia consistent with atrial tachycardia __________   2D echo 06/17/2010: - Left ventricle: The cavity size was normal. Wall thickness was    normal. Systolic function was vigorous. The estimated ejection    fraction was in the range of 65%  to 70%. Wall motion was normal;    there were no regional wall motion abnormalities. Left ventricular    diastolic function parameters were normal.  - Aortic valve: Mild to moderate regurgitation.  - Ascending aorta: The ascending aorta was mildly dilated.  - Mitral valve: Mild regurgitation.  - Left atrium: The atrium was at the upper limits of normal in size.  - Pulmonary arteries: Alexis peak pressure: 35mm Hg (S) consistent with    borderline to mild pulmonary HTN.   EKG:  EKG is ordered today.  The EKG ordered today demonstrates NSR, 60 bpm, no acute ST-T changes  Recent Labs: No results found for requested labs within last 365 days.  Recent Lipid Panel    Component Value Date/Time   CHOL 231 (H) 10/08/2015 1519   TRIG 112.0 10/08/2015 1519   HDL 106.90 10/08/2015 1519   CHOLHDL 2 10/08/2015 1519   VLDL 22.4 10/08/2015 1519   LDLCALC 101 (H) 10/08/2015 1519    PHYSICAL EXAM:    VS:  BP 117/72 (BP Location: Left Arm, Patient Position: Sitting, Cuff Size: Normal)   Pulse 60   Ht 5' 4 (1.626 m)   Wt 183 lb 3.2 oz (83.1 kg)   SpO2 98%   BMI 31.45 kg/m   BMI: Body mass index is 31.45 kg/m.  Physical Exam Constitutional:      Appearance: She is well-developed.  HENT:     Head: Normocephalic and atraumatic.  Eyes:  General:        Right eye: No discharge.        Left eye: No discharge.  Cardiovascular:     Rate and Rhythm: Normal rate and regular rhythm.     Pulses:          Dorsalis pedis pulses are 2+ on the right side and 2+ on the left side.       Posterior tibial pulses are 2+ on the right side and 2+ on the left side.     Heart sounds: S1 normal and S2 normal. Heart sounds not distant. No midsystolic click and no opening snap. Murmur heard.     Systolic murmur is present with a grade of 1/6 at the lower left sternal border.     Diastolic murmur is present with a grade of 1/4 at the upper right sternal border.     No friction rub.  Pulmonary:     Effort:  Pulmonary effort is normal. No respiratory distress.     Breath sounds: Normal breath sounds. No decreased breath sounds, wheezing, rhonchi or rales.  Chest:     Chest wall: No tenderness.  Abdominal:     General: There is no distension.  Musculoskeletal:     Cervical back: Normal range of motion.     Right lower leg: No edema.     Left lower leg: No edema.  Skin:    General: Skin is warm and dry.     Nails: There is no clubbing.  Neurological:     Mental Status: She is alert and oriented to person, place, and time.  Psychiatric:        Speech: Speech normal.        Behavior: Behavior normal.        Thought Content: Thought content normal.        Judgment: Judgment normal.     Wt Readings from Last 3 Encounters:  06/01/23 183 lb 3.2 oz (83.1 kg)  03/10/23 186 lb 6.4 oz (84.6 kg)  11/24/22 181 lb 12.8 oz (82.5 kg)     ASSESSMENT & PLAN:   PAF: Maintaining sinus rhythm with Cardizem  CD1 80 mg and propranolol  20 mg half tab 3 times daily as needed for palpitations.  CHA2DS2-VASc at least 3 (HTN, vascular disease, sex category).  Remains on apixaban  5 mg twice daily.  No symptoms concerning for bleeding.  Recent labs stable.  PSVT: Quiescent.  Remains on Cardizem  CD as above.  Mild to moderate aortic insufficiency and mild mitral regurgitation: Follow-up echo in 11/2024.  HTN: Blood pressure is well-controlled in the office.  Remains on Cardizem  CD and olmesartan.     Disposition: F/u with Dr. Darron or an APP in 6 months.   Medication Adjustments/Labs and Tests Ordered: Current medicines are reviewed at length with the patient today.  Concerns regarding medicines are outlined above. Medication changes, Labs and Tests ordered today are summarized above and listed in the Patient Instructions accessible in Encounters.   Signed, Bernardino Bring, Alexis-C 06/01/2023 2:52 PM     [facility] - [place] [address] [place], [place] [address] [phone]

## 2023-06-01 ENCOUNTER — Encounter: Payer: Self-pay | Admitting: Physician Assistant

## 2023-06-01 ENCOUNTER — Ambulatory Visit: Payer: 59 | Attending: Physician Assistant | Admitting: Physician Assistant

## 2023-06-01 VITALS — BP 117/72 | HR 60 | Ht 64.0 in | Wt 183.2 lb

## 2023-06-01 DIAGNOSIS — I10 Essential (primary) hypertension: Secondary | ICD-10-CM

## 2023-06-01 DIAGNOSIS — I471 Supraventricular tachycardia, unspecified: Secondary | ICD-10-CM

## 2023-06-01 DIAGNOSIS — I340 Nonrheumatic mitral (valve) insufficiency: Secondary | ICD-10-CM | POA: Diagnosis not present

## 2023-06-01 DIAGNOSIS — I480 Paroxysmal atrial fibrillation: Secondary | ICD-10-CM

## 2023-06-01 DIAGNOSIS — I351 Nonrheumatic aortic (valve) insufficiency: Secondary | ICD-10-CM

## 2023-06-01 NOTE — Patient Instructions (Signed)
 Medication Instructions:  Your Physician recommend you continue on your current medication as directed.    *If you need a refill on your cardiac medications before your next appointment, please call your pharmacy*   Lab Work: None ordered at this time    Follow-Up: At [facility], you and your health needs are our priority.  As part of our continuing mission to provide you with exceptional heart care, we have created designated Provider Care Teams.  These Care Teams include your primary Cardiologist (physician) and Advanced Practice Providers (APPs -  Physician Assistants and Nurse Practitioners) who all work together to provide you with the care you need, when you need it.  We recommend signing up for the patient portal called "MyChart".  Sign up information is provided on this After Visit Summary.  MyChart is used to connect with patients for Virtual Visits (Telemedicine).  Patients are able to view lab/test results, encounter notes, upcoming appointments, etc.  Non-urgent messages can be sent to your provider as well.   To learn more about what you can do with MyChart, go to [email or web address].    Your next appointment:   6 month(s)  Provider:   You may see Lorine Bears, MD or one of the following Advanced Practice Providers on your designated Care Team:   Nicolasa Ducking, NP Eula Listen, PA-C Cadence Fransico Michael, PA-C Charlsie Quest, NP Carlos Levering, NP

## 2023-08-10 ENCOUNTER — Other Ambulatory Visit: Payer: Self-pay

## 2023-08-10 MED ORDER — PROPRANOLOL HCL 20 MG PO TABS: ORAL_TABLET | ORAL | 3 refills | Status: DC

## 2023-08-10 MED ORDER — PROPRANOLOL HCL 20 MG PO TABS: ORAL_TABLET | ORAL | 2 refills | Status: DC

## 2023-09-01 ENCOUNTER — Other Ambulatory Visit: Payer: Self-pay | Admitting: Cardiovascular Disease

## 2023-12-17 ENCOUNTER — Other Ambulatory Visit: Payer: Self-pay | Admitting: Cardiovascular Disease

## 2023-12-17 DIAGNOSIS — I2699 Other pulmonary embolism without acute cor pulmonale: Secondary | ICD-10-CM

## 2023-12-17 NOTE — Telephone Encounter (Signed)
 Prescription refill request for Eliquis  received. Indication: Afib  Last office visit: 06/01/23 [place])  Scr: 0.8 (11/09/23)  Age: 58 Weight: 83.1kg  Appropriate dose. Refill sent.

## 2023-12-29 ENCOUNTER — Encounter: Payer: Self-pay | Admitting: Physician Assistant

## 2023-12-29 ENCOUNTER — Ambulatory Visit: Attending: Physician Assistant | Admitting: Physician Assistant

## 2023-12-29 VITALS — BP 120/72 | HR 57 | Ht 63.0 in | Wt 185.2 lb

## 2023-12-29 DIAGNOSIS — I351 Nonrheumatic aortic (valve) insufficiency: Secondary | ICD-10-CM | POA: Diagnosis not present

## 2023-12-29 DIAGNOSIS — I340 Nonrheumatic mitral (valve) insufficiency: Secondary | ICD-10-CM

## 2023-12-29 DIAGNOSIS — I10 Essential (primary) hypertension: Secondary | ICD-10-CM

## 2023-12-29 DIAGNOSIS — I480 Paroxysmal atrial fibrillation: Secondary | ICD-10-CM | POA: Diagnosis not present

## 2023-12-29 DIAGNOSIS — I471 Supraventricular tachycardia, unspecified: Secondary | ICD-10-CM

## 2023-12-29 NOTE — Progress Notes (Signed)
 Cardiology Office Note    Date:  12/29/2023   ID:  Alexis Bradley, DOB 07-27-1965, MRN [id number]  PCP:  Vertell Rogue, DO  Cardiologist:  Deatrice Cage, MD  Electrophysiologist:  OLE ONEIDA HOLTS, MD   Chief Complaint: Follow-up  History of Present Illness:   Alexis Bradley is a 58 y.o. female with history of paroxysmal SVT, PAF, PVC, palpitations, PE in the setting of DVT related to ankle fracture, aortic insufficiency, mitral regurgitation, HTN, and GERD who presents for follow-up of A-fib.    She was previously evaluated for PAT in 2009 at which time a Holter monitor showed runs of PVCs and SVT. She was treated with metoprolol  initially, though she was unable to tolerate this due to bradycardia and fatigue. She was changed to diltiazem  which was successful in controlling her heart rate. She underwent a cardiac cath in 2009 that showed minor luminal irregularities and an EF of 60%. Echo in 2016 showed an EF of 60-65%, no RWMA, mild to moderate AI, normal aortic root, mild MR, and PASP 35 mmHg.  She had worsening palpitations in 12/2017 with a documented brief episode of A-fib lasting a few minutes.  Echo showed normal LV systolic function, mild to moderate aortic insufficiency, mild mitral regurgitation, and borderline pulmonary hypertension.  14-day Zio patch showed normal sinus rhythm with recurrent episodes of SVT with the longest episode lasting 13 seconds.  There was no evidence of A-fib.  She was diagnosed with a PE in 2020 in the setting of DVT related to ankle fracture.  She was treated with 6 months of anticoagulation.  Due to increased fatigue and palpitations, she underwent repeat outpatient cardiac monitoring in 07/2019 which demonstrated a predominant rhythm of sinus with 11 runs of SVT with the longest episode lasting 16 beats.  She was seen in the office in 07/2020 for preoperative cardiac risk stratification for upcoming left shoulder arthroscopy, rotator cuff repair, and  subacromial decompression.  It was noted at that time she had recently had an elevated D-dimer with subsequent CTA of the chest and bilateral lower extremity venous duplex showing no evidence of PE or DVT respectively.  She subsequently underwent repeat echo in 11/2020, to evaluate her aortic valve disease, which demonstrated an EF of 60 to 65%, no regional wall motion abnormalities, normal LV diastolic function parameters, normal RV systolic function and ventricular cavity size, mildly elevated PASP estimated at 39.8 mmHg, mildly dilated left atrium, mild mitral regurgitation, and mild to moderate aortic insufficiency.  Overall, findings were stable.  She was seen in the ED in 04/2021 with left calf pain with lower extremity venous duplex showing no evidence of DVT.  She was seen in 06/2021 with increased palpitations with diltiazem  being titrated to twice daily.  She was seen in the office in 10/2021 and reported a sudden episode of tachycardia while getting her nails done with a heart rate of 155 bpm.  Symptoms lasted for about 30 minutes.  She had a subsequent episode that lasted for a shorter duration.  These episodes were captured on her Apple Watch which were highly suggestive of atrial fibrillation with RVR as reviewed by her primary cardiologist at visit in 10/2021.  Given these findings, aspirin was discontinued and she was initiated on apixaban .  Her overall burden of A-fib was not significant enough to justify antiarrhythmic medication or consideration of ablation.     She was seen in the office in 04/2022 noting continued intermittent randomly occurring tachypalpitations that were  generally short-lived, lasting a few seconds with spontaneous resolution.  She was taking as needed propranolol  3-4 nights per week.  Zio patch in 04/2022 showed a predominant rhythm of sinus with an average rate of 63 bpm, 14 episodes of SVT lasting up to 18 beats and rare PACs and PVCs.  SVT was detected with symptomatic patient  events.   She contacted our office in 07/2022 requesting to come off of apixaban  given no further episodes of documented A-fib, with overall low burden.  She did come off apixaban  at that time.  She messaged our office on 10/05/2022 noting recurrence of A-fib by Apple Watch with recommendation to resume apixaban  at that time.   She was seen on 11/24/2022 and reported that her palpitation burden was overall well-controlled.  She was maintaining sinus rhythm with a bradycardic rate at 50 bpm.  She preferred to avoid taking anticoagulation long-term.  In this setting she was referred to EP for further recommendations.  She was evaluated by Dr. Cindie in 02/2023 with recommendation for catheter ablation if patient wanted to proceed.  It was advised that she continue apixaban  for stroke prevention.  She was last seen in the office on 06/01/2023 and was doing well from a cardiac perspective.  She remains uncertain if she wanted to move forward with ablation for A-fib.  No changes were indicated in cardiac pharmacotherapy at that time.  She comes in doing well from a cardiac perspective and has been times of angina cardiac decompensation.  She has noted a couple episodes of paroxysmal palpitations, typically associated with increased caffeine or a couple glasses of wine that improved with as needed propranolol .  No significant lower extremity swelling or progressive orthopnea.  No falls or symptoms concerning for bleeding.  Adherent to apixaban  and diltiazem .  Does not have any acute cardiac concerns at this time.   Labs independently reviewed: 10/2023 - A1c 6.1, TSH normal, potassium 3.9, BUN 10, serum creatinine 0.8, albumin 4.0, AST/ALT normal, TC 272, TG 265, HDL 96, LDL 123, Hgb 13.3, PLT 291  Past Medical History:  Diagnosis Date   Aortic insufficiency    a. 05/2010 Echo: EF 65-70%; mild to moderate AI (trileaflet AoV), mild MR, Asc Ao dilated to 3.7 cm; b. 01/2015 Echo: EF 60-65%, no rwma, mild to mod AI,  nl Ao root, mild MR, PASP .   Atrial fibrillation (HCC)    4-5 YEARS AGO UPDATED 07/28/21   Cancer (HCC)    SKIN   Fatigue    Gastric polyps 06/2010   Endoscopy   GERD (gastroesophageal reflux disease) 05/2009   esophagitis by EGD Feb 2011   Heart murmur    Hiatal hernia    Paroxysmal atrial tachycardia (HCC)    a. Holter 2009 with PVCs and bigeminy. b. Holter 1/12 with PVCs and short runs of atrial tachycardia (up to 9 beats). c. Holter 12/2014: rare PACs, rare short runs of narrow complex tachycardia c/w atrial tachycardia; d. 06/2016 Event monitor: runs of SVT.   PAT (paroxysmal atrial tachycardia) (HCC)    Poor circulation of extremity    Bil legs   Premature atrial contractions    PVC's (premature ventricular contractions)    S/P left heart catheterization by percutaneous approach 2009   a. cath 11/2007: minor luminal irregularities, EF 60%.   Sinus bradycardia    a. HR occasionally 40s per patient, rare dips to 30s.   Varicose veins     Past Surgical History:  Procedure Laterality Date  ABDOMINAL HYSTERECTOMY     CARDIAC CATHETERIZATION  04/28/2007   Left heart cath   CESAREAN SECTION     CHOLECYSTECTOMY     Right GSV ELAS with 10-20 stab phlebectomies right calf  05/25/2011   ROTATOR CUFF REPAIR Left    VESICOVAGINAL FISTULA CLOSURE W/ TAH      Current Medications: Current Meds  Medication Sig   Calcium Phosphate Tribasic POWD    diltiazem  (CARDIZEM  CD) 180 MG 24 hr capsule TAKE 1 CAPSULE IN THE      MORNING AND AT BEDTIME   ELIQUIS  5 MG TABS tablet TAKE 1 TABLET TWICE A DAY   Multiple Vitamin (MULTIVITAMIN) tablet Take 1 tablet by mouth daily.   OLMESARTAN MEDOXOMIL PO Take 20 mg by mouth daily.   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    Comment: Also works in teh administration  Tobacco Use   Smoking status: Never    Passive exposure: Past (AS A CHILD)   Smokeless tobacco: Never  Vaping Use   Vaping status: Never Used  Substance and Sexual Activity   Alcohol use: Yes    Alcohol/week: 3.0 standard drinks of alcohol    Types: 3 Standard drinks or equivalent per week    Comment: GLASS OF WINE A NIGHT   Drug use: No   Sexual activity: Not on file  Other Topics Concern   Not on file  Social History Narrative   Married   Social Drivers of Health   Financial Resource Strain: Low Risk  (11/09/2023)   Received from [facility]   Overall Financial Resource Strain (CARDIA)    Difficulty of Paying Living Expenses: Not hard at all  Food Insecurity: No Food Insecurity (09/25/2022)   Received from [facility]   Hunger Vital Sign    Within the past 12 months, you worried that your food would run out before you got the money to buy more.: Never true    Within the past 12 months, the food you bought just didn't last and you didn't have money to get more.: Never true  Transportation Needs: No Transportation Needs (11/09/2023)   Received from [facility] - Transportation    In the past 12 months, has lack of transportation kept you from medical appointments or from getting medications?: No    Lack of Transportation (Non-Medical): No  Physical Activity: Not on file  Stress: Not on file  Social Connections: Not on file     Family History:  The patient's family history includes Breast cancer in her maternal grandmother, paternal grandmother, and sister; Colon polyps in her paternal aunt; Heart disease in her  father and paternal grandfather; Stroke in an other family member. There is no history of Coronary artery disease, Colon cancer, Crohn's disease, Esophageal cancer, Stomach cancer, or Rectal cancer.  ROS:   12-point review of systems is negative unless otherwise noted in the  HPI.   EKGs/Labs/Other Studies Reviewed:    Studies reviewed were summarized above. The additional studies were reviewed today:  2D echo 12/23/2022: 1. Left ventricular ejection fraction, by estimation, is 60 to 65%. The  left ventricle has normal function. The left ventricle has no regional  wall motion abnormalities. Left ventricular diastolic parameters were  normal. The average left ventricular  global longitudinal strain is -27.9 %.   2. Right ventricular systolic function is normal. The right ventricular  size is normal.   3. Left atrial size was moderately dilated.   4. The mitral valve is normal in structure. Mild mitral valve  regurgitation. No evidence of mitral stenosis.   5. The aortic valve is tricuspid. Aortic valve regurgitation is mild to  moderate. No aortic stenosis is present.   6. There is borderline dilatation of the ascending aorta, measuring 38  mm.   7. The inferior vena cava is normal in size with greater than 50%  respiratory variability, suggesting right atrial pressure of 3 mmHg.  __________   Zio patch 04/2022: Patient had a min HR of 48 bpm, max HR of 169 bpm, and avg HR of 63 bpm. Predominant underlying rhythm was Sinus Rhythm. Slight P wave morphology changes were noted.  14 Supraventricular Tachycardia runs occurred, the run with the fastest interval lasting 13 beats with a max rate of 169 bpm, the longest lasting 18 beats with an avg rate of 97 bpm. Some episodes of Supraventricular Tachycardia may be possible Atrial Tachycardia with variable block. Supraventricular Tachycardia was detected within +/- 45 seconds of symptomatic patient event(s).  Rare PACs and rare  PVCs. __________   2D echo 11/29/2020: 1. Left ventricular ejection fraction, by estimation, is 60 to 65%. The  left ventricle has normal function. The left ventricle has no regional  wall motion abnormalities. Left ventricular diastolic parameters were  normal. The average left ventricular  global longitudinal strain is -23.5 %.   2. Right ventricular systolic function is normal. The right ventricular  size is normal. There is mildly elevated pulmonary artery systolic  pressure. The estimated right ventricular systolic pressure is 39.8 mmHg.   3. Left atrial size was mildly dilated.   4. The mitral valve is normal in structure. Mild mitral valve  regurgitation.   5. The aortic valve is normal in structure. Aortic valve regurgitation is  mild to moderate. __________   Zio patch 07/2019: Normal sinus rhythm with an average heart rate of 65 bpm. 11 runs of supraventricular tachycardia.  The longest lasted 16 beats with an average heart rate of 124 bpm. Rare PACs and rare PVCs. Most triggered events did not correlate with arrhythmia.  However, some correlated with SVT and premature beats. __________   2D echo 12/14/2018: 1. The left ventricle has normal systolic function with an ejection  fraction of 60-65%. The cavity size was normal. Left ventricular diastolic  parameters were normal. No evidence of left ventricular regional wall  motion abnormalities.   2. The right ventricle has normal systolic function. The cavity was  normal. There is no increase in right ventricular wall thickness. Right  ventricular systolic pressure is normal with an estimated pressure of 29.2  mmHg.   3. Left atrial size was mildly dilated.   4. The aortic valve is tricuspid. Aortic valve regurgitation is mild to  moderate by color flow Doppler.   5. The aorta is abnormal unless otherwise noted.   6. There is mild dilatation of the ascending aorta measuring  36 mm.   7. The interatrial septum was not well  visualized. __________   Zio patch 01/2018: Normal sinus rhythm with an average heart rate of 67 bpm. 26 episodes of SVT noted.  The longest episode lasted 13 seconds with a heart rate of 117 bpm. Rare PVCs. Most of the patient's reported events of irregular heartbeats and dizziness did not correlate with arrhythmia. __________   2D echo 01/18/2018: - Left ventricle: The cavity size was normal. Wall thickness was    normal. Systolic function was normal. The estimated ejection    fraction was in the range of 60% to 65%. Wall motion was normal;    there were no regional wall motion abnormalities. Left    ventricular diastolic function parameters were normal.  - Aortic valve: There was mild to moderate regurgitation.  - Mitral valve: There was mild regurgitation.  - Left atrium: The atrium was mildly dilated.  - Pulmonary arteries: Systolic pressure was mildly increased. PA    peak pressure: 35 mm Hg (S). ___________   Zio patch 06/2016: Normal sinus rhythm. Short runs of supraventricular tachycardia the longest lasted for 30 seconds. Fastest heart rate during these episodes was 170 bpm. Average heart rate overall was 65 bpm. __________   2D echo 01/29/2015: - Left ventricle: The cavity size was normal. Wall thickness was    normal. Systolic function was normal. The estimated ejection    fraction was in the range of 60% to 65%. Wall motion was normal;    there were no regional wall motion abnormalities. Left    ventricular diastolic function parameters were normal.  - Aortic valve: There was mild to moderate regurgitation.  - Mitral valve: There was mild regurgitation.  - Pulmonary arteries: Systolic pressure was at the upper limits of    normal. PA peak pressure: 35 mm Hg (S). __________   24-hour Holter 12/2014: NSR with rare APCs, Rare short runs of narrow complex tachycardia consistent with atrial tachycardia __________   2D echo 06/17/2010: - Left ventricle: The cavity  size was normal. Wall thickness was    normal. Systolic function was vigorous. The estimated ejection    fraction was in the range of 65% to 70%. Wall motion was normal;    there were no regional wall motion abnormalities. Left ventricular    diastolic function parameters were normal.  - Aortic valve: Mild to moderate regurgitation.  - Ascending aorta: The ascending aorta was mildly dilated.  - Mitral valve: Mild regurgitation.  - Left atrium: The atrium was at the upper limits of normal in size.  - Pulmonary arteries: PA peak pressure: 35mm Hg (S) consistent with    borderline to mild pulmonary HTN.   EKG:  EKG is ordered today.  The EKG ordered today demonstrates sinus bradycardia, 57 bpm, LVH, no acute ST-T changes  Recent Labs: No results found for requested labs within last 365 days.  Recent Lipid Panel    Component Value Date/Time   CHOL 231 (H) 10/08/2015 1519   TRIG 112.0 10/08/2015 1519   HDL 106.90 10/08/2015 1519   CHOLHDL 2 10/08/2015 1519   VLDL 22.4 10/08/2015 1519   LDLCALC 101 (H) 10/08/2015 1519    PHYSICAL EXAM:    VS:  BP 120/72 (BP Location: Left Arm, Patient Position: Sitting, Cuff Size: Normal)   Pulse (!) 57   Ht 5' 3 (1.6 m)   Wt 185 lb 3.2 oz (84 kg)   SpO2 99%   BMI 32.81 kg/m  BMI: Body mass index is 32.81 kg/m.  Physical Exam Vitals reviewed.  Constitutional:      Appearance: She is well-developed.  HENT:     Head: Normocephalic and atraumatic.  Eyes:     General:        Right eye: No discharge.        Left eye: No discharge.  Cardiovascular:     Rate and Rhythm: Normal rate and regular rhythm.     Heart sounds: S1 normal and S2 normal. Heart sounds not distant. No midsystolic click and no opening snap. Murmur heard.     Systolic murmur is present with a grade of 1/6 at the upper right sternal border.     No friction rub.  Pulmonary:     Effort: Pulmonary effort is normal. No respiratory distress.     Breath sounds: Normal breath  sounds. No decreased breath sounds, wheezing, rhonchi or rales.  Musculoskeletal:     Cervical back: Normal range of motion.     Right lower leg: No edema.     Left lower leg: No edema.  Skin:    General: Skin is warm and dry.     Nails: There is no clubbing.  Neurological:     Mental Status: She is alert and oriented to person, place, and time.  Psychiatric:        Speech: Speech normal.        Behavior: Behavior normal.        Thought Content: Thought content normal.        Judgment: Judgment normal.     Wt Readings from Last 3 Encounters:  12/29/23 185 lb 3.2 oz (84 kg)  06/01/23 183 lb 3.2 oz (83.1 kg)  03/10/23 186 lb 6.4 oz (84.6 kg)     ASSESSMENT & PLAN:   PAF: Maintaining sinus rhythm with Cardizem  CD 180 mg twice daily and propranolol  20 mg half tab 3 times daily as needed for palpitations.  CHA2DS2-VASc at least 3 (HTN, vascular disease, sex category).  She remains on apixaban  5 mg twice daily.  No symptoms concerning for bleeding or falls.  Recent labs stable.  PSVT: Quiescent.  Remains on Cardizem  CD as above.  Mild to moderate aortic insufficiency and mild mitral regurgitation: Follow-up echo in 11/2024.  HTN: Blood pressure is well-controlled in the office today.  She remains on Cardizem  CD 180 mg twice daily and olmesartan.     Disposition: F/u with Dr. Darron or an APP in 6 months.    Medication Adjustments/Labs and Tests Ordered: Current medicines are reviewed at length with the patient today.  Concerns regarding medicines are outlined above. Medication changes, Labs and Tests ordered today are summarized above and listed in the Patient Instructions accessible in Encounters.   Signed, Bernardino Bring, PA-C 12/29/2023 5:12 PM      [facility] [address] [place], [place] [address] [phone]

## 2023-12-29 NOTE — Patient Instructions (Signed)
 Medication Instructions:  Your physician recommends that you continue on your current medications as directed. Please refer to the Current Medication list given to you today.   *If you need a refill on your cardiac medications before your next appointment, please call your pharmacy*  Lab Work: None ordered at this time   Follow-Up: At [facility], you and your health needs are our priority.  As part of our continuing mission to provide you with exceptional heart care, our providers are all part of one team.  This team includes your primary Cardiologist (physician) and Advanced Practice Providers or APPs (Physician Assistants and Nurse Practitioners) who all work together to provide you with the care you need, when you need it.  Your next appointment:   6 month(s)  Provider:   You may see Deatrice Cage, MD or Bernardino Bring, PA-C

## 2024-02-04 ENCOUNTER — Ambulatory Visit: Admitting: Physician Assistant

## 2024-05-06 ENCOUNTER — Other Ambulatory Visit: Payer: Self-pay | Admitting: Physician Assistant

## 2024-05-10 ENCOUNTER — Telehealth: Payer: Self-pay | Admitting: Cardiovascular Disease

## 2024-05-10 NOTE — Telephone Encounter (Signed)
 Returned call; no answer; LM letting pt know that we had received her message about her episode of AFIB; she had afib from 4 am to 8am and went back into NSR with a HR of 62; experiencing no symptoms.  Requested that she call back if she had any questions or concerns.  Will pass her message on to her provider.

## 2024-05-10 NOTE — Telephone Encounter (Signed)
 Patient c/o Palpitations:  STAT if patient reporting lightheadedness, shortness of breath, or chest pain  How long have you had palpitations/irregular HR/ Afib? Are you having the symptoms now? Started at 4 am 05/10/2024 and went back into rhythm around 8 am   Are you currently experiencing lightheadedness, SOB or CP? No  Do you have a history of afib (atrial fibrillation) or irregular heart rhythm? Yes  Have you checked your BP or HR? (document readings if available): HR 62  Are you experiencing any other symptoms? No

## 2024-05-11 NOTE — Telephone Encounter (Signed)
 Thank you for the update.  Please reach out to the patient and ensure she is doing well at this time, and to assess if we need to move her appointment up if she continues to have episodes of palpitations.

## 2024-05-11 NOTE — Telephone Encounter (Signed)
 Pt called and reports feeling better now, HR 64  Pt reports symptomatic Afib as determined by Apple watch yesterday, HR mostly in the 80s (usually 56), pt reported feeling tired  RD consulted and because this is the second occurrence in 1 month with symptoms appt scheduled for 1/20 at 8:25 am

## 2024-05-14 NOTE — Progress Notes (Unsigned)
 "  Cardiology Office Note    Date:  05/16/2024   ID:  Alexis Bradley, Alexis Bradley 10/31/1965, MRN [id number]  PCP:  Vertell Rogue, DO  Cardiologist:  Deatrice Cage, MD  Electrophysiologist:  OLE ONEIDA HOLTS, MD (Inactive)   Chief Complaint: Palpitations   History of Present Illness:   Alexis Bradley is a 59 y.o. female with history of paroxysmal SVT, PAF, PVC, palpitations, PE in the setting of DVT related to ankle fracture, aortic insufficiency, mitral regurgitation, HTN, and GERD who presents for evaluation of A-fib.  She was previously evaluated for PAT in 2009 at which time a Holter monitor showed runs of PVCs and SVT. She was treated with metoprolol  initially, though she was unable to tolerate this due to bradycardia and fatigue. She was changed to diltiazem  which was successful in controlling her heart rate. She underwent a cardiac cath in 2009 that showed minor luminal irregularities and an EF of 60%. Echo in 2016 showed an EF of 60-65%, no RWMA, mild to moderate AI, normal aortic root, mild MR, and PASP 35 mmHg.  She had worsening palpitations in 12/2017 with a documented brief episode of A-fib lasting a few minutes.  Echo showed normal LV systolic function, mild to moderate aortic insufficiency, mild mitral regurgitation, and borderline pulmonary hypertension.  14-day Zio patch showed normal sinus rhythm with recurrent episodes of SVT with the longest episode lasting 13 seconds.  There was no evidence of A-fib.  She was diagnosed with a PE in 2020 in the setting of DVT related to ankle fracture.  She was treated with 6 months of anticoagulation.  Due to increased fatigue and palpitations, she underwent repeat outpatient cardiac monitoring in 07/2019 which demonstrated a predominant rhythm of sinus with 11 runs of SVT with the longest episode lasting 16 beats.  She was seen in the office in 07/2020 for preoperative cardiac risk stratification for upcoming left shoulder arthroscopy, rotator cuff repair,  and subacromial decompression.  It was noted at that time she had recently had an elevated D-dimer with subsequent CTA of the chest and bilateral lower extremity venous duplex showing no evidence of PE or DVT respectively.  She subsequently underwent repeat echo in 11/2020, to evaluate her aortic valve disease, which demonstrated an EF of 60 to 65%, no regional wall motion abnormalities, normal LV diastolic function parameters, normal RV systolic function and ventricular cavity size, mildly elevated PASP estimated at 39.8 mmHg, mildly dilated left atrium, mild mitral regurgitation, and mild to moderate aortic insufficiency.  Overall, findings were stable.  She was seen in the ED in 04/2021 with left calf pain with lower extremity venous duplex showing no evidence of DVT.  She was seen in 06/2021 with increased palpitations with diltiazem  being titrated to twice daily.  She was seen in the office in 10/2021 and reported a sudden episode of tachycardia while getting her nails done with a heart rate of 155 bpm.  Symptoms lasted for about 30 minutes.  She had a subsequent episode that lasted for a shorter duration.  These episodes were captured on her Apple Watch which were highly suggestive of atrial fibrillation with RVR as reviewed by her primary cardiologist at visit in 10/2021.  Given these findings, aspirin was discontinued and she was initiated on apixaban .  Her overall burden of A-fib was not significant enough to justify antiarrhythmic medication or consideration of ablation.     She was seen in the office in 04/2022 noting continued intermittent randomly occurring tachypalpitations that  were generally short-lived, lasting a few seconds with spontaneous resolution.  She was taking as needed propranolol  3-4 nights per week.  Zio patch in 04/2022 showed a predominant rhythm of sinus with an average rate of 63 bpm, 14 episodes of SVT lasting up to 18 beats and rare PACs and PVCs.  SVT was detected with symptomatic  patient events.   She contacted our office in 07/2022 requesting to come off of apixaban  given no further episodes of documented A-fib, with overall low burden.  She did come off apixaban  at that time.  She messaged our office on 10/05/2022 noting recurrence of A-fib by Apple Watch with recommendation to resume apixaban  at that time.   She was seen on 11/24/2022 and reported that her palpitation burden was overall well-controlled.  She was maintaining sinus rhythm with a bradycardic rate at 50 bpm.  She preferred to avoid taking anticoagulation long-term.  In this setting she was referred to EP for further recommendations.  She was evaluated by Dr. Cindie in 02/2023 with recommendation for catheter ablation if patient wanted to proceed.  It was advised that she continue apixaban  for stroke prevention.   She was last seen in the office in 12/2023 and was doing well from a cardiac perspective, noting a couple episodes of palpitations typically associated with increased caffeine or a couple glasses of wine.  She was continued on Cardizem  CD 180 mg twice daily along with as needed propranolol .  She notified our office earlier this month of an episode of tachypalpitations that lasted for around 4 hours with appointment being scheduled for today.  She has been under increased stress lately following the passing of her mother-in-law Thanksgiving, and with her husband recently retiring.  She had an episode of tachypalpitations with Apple watch capturing A-fib on 04/27/2024 that lasted for a couple of hours.  She had a recurrent episode of tachypalpitations on 05/10/2024 that lasted for about 4 hours with watch confirming A-fib.  Rates were reasonably controlled during each of these episodes, though she was symptomatic with palpitations.  No chest pain, dyspnea, dizziness, presyncope, or syncope.  No falls or symptoms concerning for bleeding.  She has been adherent to anticoagulation and denies missing any doses of  apixaban .  During each of these episodes she took a dose of propranolol .   Labs independently reviewed: 04/2024 - potassium 4.0, BUN 11, serum creatinine 0.8, A1c 5.9 10/2023 - TSH normal, albumin 4.0, AST/ALT normal, TC 272, TG 265, HDL 96, LDL 123, Hgb 13.3, PLT 291   Past Medical History:  Diagnosis Date   Aortic insufficiency    a. 05/2010 Echo: EF 65-70%; mild to moderate AI (trileaflet AoV), mild MR, Asc Ao dilated to 3.7 cm; b. 01/2015 Echo: EF 60-65%, no rwma, mild to mod AI, nl Ao root, mild MR, PASP .   Atrial fibrillation (HCC)    4-5 YEARS AGO UPDATED 07/28/21   Cancer (HCC)    SKIN   Fatigue    Gastric polyps 06/2010   Endoscopy   GERD (gastroesophageal reflux disease) 05/2009   esophagitis by EGD Feb 2011   Heart murmur    Hiatal hernia    Paroxysmal atrial tachycardia    a. Holter 2009 with PVCs and bigeminy. b. Holter 1/12 with PVCs and short runs of atrial tachycardia (up to 9 beats). c. Holter 12/2014: rare PACs, rare short runs of narrow complex tachycardia c/w atrial tachycardia; d. 06/2016 Event monitor: runs of SVT.   PAT (paroxysmal atrial tachycardia)  Poor circulation of extremity    Bil legs   Premature atrial contractions    PVC's (premature ventricular contractions)    S/P left heart catheterization by percutaneous approach 2009   a. cath 11/2007: minor luminal irregularities, EF 60%.   Sinus bradycardia    a. HR occasionally 40s per patient, rare dips to 30s.   Varicose veins     Past Surgical History:  Procedure Laterality Date   ABDOMINAL HYSTERECTOMY     CARDIAC CATHETERIZATION  04/28/2007   Left heart cath   CESAREAN SECTION     CHOLECYSTECTOMY     Right GSV ELAS with 10-20 stab phlebectomies right calf  05/25/2011   ROTATOR CUFF REPAIR Left    VESICOVAGINAL FISTULA CLOSURE W/ TAH      Current Medications: Active Medications[1]  Allergies:   Tape   Social History   Socioeconomic History   Marital status: Married    Spouse name:  Not on file   Number of children: Not on file   Years of education: Not on file   Highest education level: Not on file  Occupational History   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    Comment: Also works in teh administration  Tobacco Use   Smoking status: Never    Passive exposure: Past (AS A CHILD)   Smokeless tobacco: Never  Vaping Use   Vaping status: Never Used  Substance and Sexual Activity   Alcohol use: Yes    Alcohol/week: 3.0 standard drinks of alcohol    Types: 3 Standard drinks or equivalent per week    Comment: GLASS OF WINE A NIGHT   Drug use: No   Sexual activity: Not on file  Other Topics Concern   Not on file  Social History Narrative   Married   Social Drivers of Health   Tobacco Use: Low Risk (05/16/2024)   Patient History    Smoking Tobacco Use: Never    Smokeless Tobacco Use: Never    Passive Exposure: Past  Financial Resource Strain: Low Risk  (11/09/2023)   [facility]   Overall Financial Resource Strain (CARDIA)    Difficulty of Paying Living Expenses: Not hard at all  Food Insecurity: No Food Insecurity (09/25/2022)   Received from [facility]   Epic    Within the past 12 months, you worried that your food would run out before you got the money to buy more.: Never true    Within the past 12 months, the food you bought just didn't last and you didn't have money to get more.: Never true  Transportation Needs: No Transportation Needs (11/09/2023)   Received from [facility] - Transportation    In the past 12 months, has lack of transportation kept you from medical appointments or from getting medications?: No    Lack of Transportation (Non-Medical): No  Physical Activity: Not on file  Stress: Not on file  Social Connections: Not on file  Depression (EYV7-0): Not on file  Alcohol Screen: Not on file  Housing: Unknown (05/11/2024)    Received from [facility]   Epic    In the last 12 months, was there a time when you were not able to pay the mortgage or rent on time?: No    Number of Times Moved in the Last Year: Not on file    At any time in the past 12 months,  were you homeless or living in a shelter (including now)?: No  Utilities: Not At Risk (11/09/2023)   Received from [facility]   Epic    In the past 12 months has the electric, gas, oil, or water company threatened to shut off services in your home?: No  Health Literacy: Not on file     Family History:  The patient's family history includes Breast cancer in her maternal grandmother, paternal grandmother, and sister; Colon polyps in her paternal aunt; Heart disease in her father and paternal grandfather; Stroke in an other family member. There is no history of Coronary artery disease, Colon cancer, Crohn's disease, Esophageal cancer, Stomach cancer, or Rectal cancer.  ROS:   12-point review of systems is negative unless otherwise noted in the HPI.   EKGs/Labs/Other Studies Reviewed:    Studies reviewed were summarized above. The additional studies were reviewed today:  2D echo 12/23/2022: 1. Left ventricular ejection fraction, by estimation, is 60 to 65%. The  left ventricle has normal function. The left ventricle has no regional  wall motion abnormalities. Left ventricular diastolic parameters were  normal. The average left ventricular  global longitudinal strain is -27.9 %.   2. Right ventricular systolic function is normal. The right ventricular  size is normal.   3. Left atrial size was moderately dilated.   4. The mitral valve is normal in structure. Mild mitral valve  regurgitation. No evidence of mitral stenosis.   5. The aortic valve is tricuspid. Aortic valve regurgitation is mild to  moderate. No aortic stenosis is present.   6. There is borderline dilatation of the ascending aorta, measuring 38  mm.   7.  The inferior vena cava is normal in size with greater than 50%  respiratory variability, suggesting right atrial pressure of 3 mmHg.  __________   Zio patch 04/2022: Patient had a min HR of 48 bpm, max HR of 169 bpm, and avg HR of 63 bpm. Predominant underlying rhythm was Sinus Rhythm. Slight P wave morphology changes were noted.  14 Supraventricular Tachycardia runs occurred, the run with the fastest interval lasting 13 beats with a max rate of 169 bpm, the longest lasting 18 beats with an avg rate of 97 bpm. Some episodes of Supraventricular Tachycardia may be possible Atrial Tachycardia with variable block. Supraventricular Tachycardia was detected within +/- 45 seconds of symptomatic patient event(s).  Rare PACs and rare PVCs. __________   2D echo 11/29/2020: 1. Left ventricular ejection fraction, by estimation, is 60 to 65%. The  left ventricle has normal function. The left ventricle has no regional  wall motion abnormalities. Left ventricular diastolic parameters were  normal. The average left ventricular  global longitudinal strain is -23.5 %.   2. Right ventricular systolic function is normal. The right ventricular  size is normal. There is mildly elevated pulmonary artery systolic  pressure. The estimated right ventricular systolic pressure is 39.8 mmHg.   3. Left atrial size was mildly dilated.   4. The mitral valve is normal in structure. Mild mitral valve  regurgitation.   5. The aortic valve is normal in structure. Aortic valve regurgitation is  mild to moderate. __________   Zio patch 07/2019: Normal sinus rhythm with an average heart rate of 65 bpm. 11 runs of supraventricular tachycardia.  The longest lasted 16 beats with an average heart rate of 124 bpm. Rare PACs and rare PVCs. Most triggered events did not correlate with arrhythmia.  However, some correlated with SVT and  premature beats. __________   2D echo 12/14/2018: 1. The left ventricle has normal systolic  function with an ejection  fraction of 60-65%. The cavity size was normal. Left ventricular diastolic  parameters were normal. No evidence of left ventricular regional wall  motion abnormalities.   2. The right ventricle has normal systolic function. The cavity was  normal. There is no increase in right ventricular wall thickness. Right  ventricular systolic pressure is normal with an estimated pressure of 29.2  mmHg.   3. Left atrial size was mildly dilated.   4. The aortic valve is tricuspid. Aortic valve regurgitation is mild to  moderate by color flow Doppler.   5. The aorta is abnormal unless otherwise noted.   6. There is mild dilatation of the ascending aorta measuring 36 mm.   7. The interatrial septum was not well visualized. __________   Zio patch 01/2018: Normal sinus rhythm with an average heart rate of 67 bpm. 26 episodes of SVT noted.  The longest episode lasted 13 seconds with a heart rate of 117 bpm. Rare PVCs. Most of the patient's reported events of irregular heartbeats and dizziness did not correlate with arrhythmia. __________   2D echo 01/18/2018: - Left ventricle: The cavity size was normal. Wall thickness was    normal. Systolic function was normal. The estimated ejection    fraction was in the range of 60% to 65%. Wall motion was normal;    there were no regional wall motion abnormalities. Left    ventricular diastolic function parameters were normal.  - Aortic valve: There was mild to moderate regurgitation.  - Mitral valve: There was mild regurgitation.  - Left atrium: The atrium was mildly dilated.  - Pulmonary arteries: Systolic pressure was mildly increased. PA    peak pressure: 35 mm Hg (S). ___________   Zio patch 06/2016: Normal sinus rhythm. Short runs of supraventricular tachycardia the longest lasted for 30 seconds. Fastest heart rate during these episodes was 170 bpm. Average heart rate overall was 65 bpm. __________   2D echo 01/29/2015: -  Left ventricle: The cavity size was normal. Wall thickness was    normal. Systolic function was normal. The estimated ejection    fraction was in the range of 60% to 65%. Wall motion was normal;    there were no regional wall motion abnormalities. Left    ventricular diastolic function parameters were normal.  - Aortic valve: There was mild to moderate regurgitation.  - Mitral valve: There was mild regurgitation.  - Pulmonary arteries: Systolic pressure was at the upper limits of    normal. PA peak pressure: 35 mm Hg (S). __________   24-hour Holter 12/2014: NSR with rare APCs, Rare short runs of narrow complex tachycardia consistent with atrial tachycardia __________   2D echo 06/17/2010: - Left ventricle: The cavity size was normal. Wall thickness was    normal. Systolic function was vigorous. The estimated ejection    fraction was in the range of 65% to 70%. Wall motion was normal;    there were no regional wall motion abnormalities. Left ventricular    diastolic function parameters were normal.  - Aortic valve: Mild to moderate regurgitation.  - Ascending aorta: The ascending aorta was mildly dilated.  - Mitral valve: Mild regurgitation.  - Left atrium: The atrium was at the upper limits of normal in size.  - Pulmonary arteries: PA peak pressure: 35mm Hg (S) consistent with    borderline to mild pulmonary HTN.  EKG:  EKG is ordered today.  The EKG ordered today demonstrates sinus bradycardia, 59 bpm, no acute st/t changes  Recent Labs: No results found for requested labs within last 365 days.  Recent Lipid Panel    Component Value Date/Time   CHOL 231 (H) 10/08/2015 1519   TRIG 112.0 10/08/2015 1519   HDL 106.90 10/08/2015 1519   CHOLHDL 2 10/08/2015 1519   VLDL 22.4 10/08/2015 1519   LDLCALC 101 (H) 10/08/2015 1519    PHYSICAL EXAM:    VS:  BP (!) 140/78 (BP Location: Left Arm, Patient Position: Sitting, Cuff Size: Normal)   Pulse (!) 59 Comment: 62 oximeter  Ht  5' 4 (1.626 m)   Wt 189 lb 12.8 oz (86.1 kg)   SpO2 98%   BMI 32.58 kg/m   BMI: Body mass index is 32.58 kg/m.  Physical Exam Constitutional:      Appearance: She is well-developed.  HENT:     Head: Normocephalic and atraumatic.  Eyes:     General:        Right eye: No discharge.        Left eye: No discharge.  Cardiovascular:     Rate and Rhythm: Normal rate and regular rhythm.     Heart sounds: S1 normal and S2 normal. Heart sounds not distant. No midsystolic click and no opening snap. Murmur heard.     Diastolic murmur is present with a grade of 1/4 at the upper right sternal border.     No friction rub.  Pulmonary:     Effort: Pulmonary effort is normal. No respiratory distress.     Breath sounds: Normal breath sounds. No decreased breath sounds, wheezing, rhonchi or rales.  Musculoskeletal:     Cervical back: Normal range of motion.     Right lower leg: No edema.     Left lower leg: No edema.  Skin:    General: Skin is warm and dry.     Nails: There is no clubbing.  Neurological:     Mental Status: She is alert and oriented to person, place, and time.  Psychiatric:        Speech: Speech normal.        Behavior: Behavior normal.        Thought Content: Thought content normal.        Judgment: Judgment normal.     Wt Readings from Last 3 Encounters:  05/16/24 189 lb 12.8 oz (86.1 kg)  12/29/23 185 lb 3.2 oz (84 kg)  06/01/23 183 lb 3.2 oz (83.1 kg)     ASSESSMENT & PLAN:   PAF: Maintaining sinus rhythm on Cardizem  CD 180 mg twice daily.  In the setting of increased stress she has had 2 episodes of recurrent A-fib this month that have been self-limiting following as needed propranolol .  Resting heart rate precludes further escalation of Cardizem  CD or addition of standing beta-blocker.  She will continue to monitor for breakthrough A-fib and notify us  if this becomes a trend.  For now, continue Cardizem  CD 180 mg twice daily and as needed propranolol .  CHA2DS2-VASc  at least 3 (HTN, vascular disease, sex category).  Remains on apixaban  5 mg twice daily and has not missed any doses.  No falls or symptoms concerning for bleeding.  Recent labs stable.  PSVT: Quiescent.  Remains on Cardizem  CD 180 mg twice daily.  Mild to moderate aortic insufficiency and mild mitral regurgitation: Schedule follow-up echo in 11/2024.  HTN: Blood pressure is mildly  elevated in the office today though typically well-controlled.  Remains on Cardizem  CD 180 mg twice daily and olmesartan 20 mg.     Disposition: F/u with Dr. Darron or an APP after echo in 11/2024, sooner if needed.   Medication Adjustments/Labs and Tests Ordered: Current medicines are reviewed at length with the patient today.  Concerns regarding medicines are outlined above. Medication changes, Labs and Tests ordered today are summarized above and listed in the Patient Instructions accessible in Encounters.   Signed, Bernardino Bring, PA-C 05/16/2024 11:01 AM     [facility] [address] [place], [place] [address] [phone]     [1]  Current Meds  Medication Sig   Calcium Phosphate Tribasic POWD    diltiazem  (CARDIZEM  CD) 180 MG 24 hr capsule TAKE 1 CAPSULE IN THE      MORNING AND AT BEDTIME   ELIQUIS  5 MG TABS tablet TAKE 1 TABLET TWICE A DAY   Multiple Vitamin (MULTIVITAMIN) tablet Take 1 tablet by mouth daily.   OLMESARTAN MEDOXOMIL PO Take 20 mg by mouth daily.   omeprazole (PRILOSEC) 20 MG capsule TAKE 1 CAPSULE(20 MG) BY MOUTH EVERY DAY   propranolol  (INDERAL ) 20 MG tablet TAKE 1/2 TABLET THREE TIMESDAILY AS NEEDED FOR        PALPITATIONS   "

## 2024-05-16 ENCOUNTER — Ambulatory Visit: Attending: Physician Assistant | Admitting: Physician Assistant

## 2024-05-16 ENCOUNTER — Encounter: Payer: Self-pay | Admitting: Physician Assistant

## 2024-05-16 VITALS — BP 140/78 | HR 59 | Ht 64.0 in | Wt 189.8 lb

## 2024-05-16 DIAGNOSIS — I480 Paroxysmal atrial fibrillation: Secondary | ICD-10-CM | POA: Diagnosis not present

## 2024-05-16 DIAGNOSIS — I351 Nonrheumatic aortic (valve) insufficiency: Secondary | ICD-10-CM

## 2024-05-16 DIAGNOSIS — I340 Nonrheumatic mitral (valve) insufficiency: Secondary | ICD-10-CM | POA: Diagnosis not present

## 2024-05-16 DIAGNOSIS — I10 Essential (primary) hypertension: Secondary | ICD-10-CM | POA: Diagnosis not present

## 2024-05-16 DIAGNOSIS — I471 Supraventricular tachycardia, unspecified: Secondary | ICD-10-CM

## 2024-05-16 NOTE — Patient Instructions (Signed)
 Medication Instructions:  Your physician recommends that you continue on your current medications as directed. Please refer to the Current Medication list given to you today.   *If you need a refill on your cardiac medications before your next appointment, please call your pharmacy*  Lab Work: None ordered at this time  If you have labs (blood work) drawn today and your tests are completely normal, you will receive your results only by: MyChart Message (if you have MyChart) OR A paper copy in the mail If you have any lab test that is abnormal or we need to change your treatment, we will call you to review the results.  Testing/Procedures: Your physician has requested that you have an echocardiogram in August. Echocardiography is a painless test that uses sound waves to create images of your heart. It provides your doctor with information about the size and shape of your heart and how well your hearts chambers and valves are working.   You may receive an ultrasound enhancing agent through an IV if needed to better visualize your heart during the echo. This procedure takes approximately one hour.  There are no restrictions for this procedure.  This will take place at [address] [facility] [address], [place] [address]  Please note: We ask at that you not bring children with you during ultrasound (echo/ vascular) testing. Due to room size and safety concerns, children are not allowed in the ultrasound rooms during exams. Our front office staff cannot provide observation of children in our lobby area while testing is being conducted. An adult accompanying a patient to their appointment will only be allowed in the ultrasound room at the discretion of the ultrasound technician under special circumstances. We apologize for any inconvenience.   Follow-Up: At [facility], you and your health needs are our priority.  As part of our continuing mission to provide you with  exceptional heart care, our providers are all part of one team.  This team includes your primary Cardiologist (physician) and Advanced Practice Providers or APPs (Physician Assistants and Nurse Practitioners) who all work together to provide you with the care you need, when you need it.  Your next appointment:   After Echo in August  Provider:   You may see Deatrice Cage, MD or Bernardino Bring, PA-C   We recommend signing up for the patient portal called MyChart.  Sign up information is provided on this After Visit Summary.  MyChart is used to connect with patients for Virtual Visits (Telemedicine).  Patients are able to view lab/test results, encounter notes, upcoming appointments, etc.  Non-urgent messages can be sent to your provider as well.   To learn more about what you can do with MyChart, go to [email or web address].

## 2024-07-11 ENCOUNTER — Ambulatory Visit: Admitting: Cardiovascular Disease

## 2024-11-27 ENCOUNTER — Ambulatory Visit
